# Patient Record
Sex: Female | Born: 1950 | Race: White | Hispanic: No | Smoking: Current every day smoker
Health system: Southern US, Community
[De-identification: ages and names within clinical notes are randomized; demographics above are authoritative.]

## PROBLEM LIST (undated history)

## (undated) DIAGNOSIS — E669 Obesity, unspecified: Secondary | ICD-10-CM

## (undated) DIAGNOSIS — Z0389 Encounter for observation for other suspected diseases and conditions ruled out: Secondary | ICD-10-CM

## (undated) DIAGNOSIS — G4733 Obstructive sleep apnea (adult) (pediatric): Secondary | ICD-10-CM

## (undated) DIAGNOSIS — L4050 Arthropathic psoriasis, unspecified: Secondary | ICD-10-CM

## (undated) DIAGNOSIS — I10 Essential (primary) hypertension: Secondary | ICD-10-CM

## (undated) DIAGNOSIS — B029 Zoster without complications: Secondary | ICD-10-CM

## (undated) DIAGNOSIS — E119 Type 2 diabetes mellitus without complications: Secondary | ICD-10-CM

## (undated) DIAGNOSIS — N189 Chronic kidney disease, unspecified: Secondary | ICD-10-CM

## (undated) DIAGNOSIS — E785 Hyperlipidemia, unspecified: Secondary | ICD-10-CM

## (undated) DIAGNOSIS — Z9989 Dependence on other enabling machines and devices: Secondary | ICD-10-CM

## (undated) HISTORY — DX: Obstructive sleep apnea (adult) (pediatric): G47.33

## (undated) HISTORY — DX: Encounter for observation for other suspected diseases and conditions ruled out: Z03.89

## (undated) HISTORY — DX: Hyperlipidemia, unspecified: E78.5

## (undated) HISTORY — DX: Obesity, unspecified: E66.9

## (undated) HISTORY — DX: Zoster without complications: B02.9

## (undated) HISTORY — DX: Dependence on other enabling machines and devices: Z99.89

---

## 1981-01-30 HISTORY — PX: ABDOMINAL HYSTERECTOMY: SHX81

## 1999-08-24 ENCOUNTER — Encounter: Admission: RE | Admit: 1999-08-24 | Discharge: 1999-11-22 | Payer: Self-pay | Admitting: Family Medicine

## 1999-10-24 ENCOUNTER — Encounter: Admission: RE | Admit: 1999-10-24 | Discharge: 1999-10-24 | Payer: Self-pay | Admitting: Family Medicine

## 1999-10-24 ENCOUNTER — Encounter: Payer: Self-pay | Admitting: Family Medicine

## 2000-04-04 ENCOUNTER — Encounter: Payer: Self-pay | Admitting: Gynecology

## 2000-04-04 ENCOUNTER — Encounter: Admission: RE | Admit: 2000-04-04 | Discharge: 2000-04-04 | Payer: Self-pay | Admitting: Gynecology

## 2000-05-24 ENCOUNTER — Encounter: Payer: Self-pay | Admitting: Family Medicine

## 2000-05-24 ENCOUNTER — Encounter: Admission: RE | Admit: 2000-05-24 | Discharge: 2000-05-24 | Payer: Self-pay | Admitting: Family Medicine

## 2001-05-02 ENCOUNTER — Other Ambulatory Visit: Admission: RE | Admit: 2001-05-02 | Discharge: 2001-05-02 | Payer: Self-pay | Admitting: Gynecology

## 2002-12-04 ENCOUNTER — Encounter: Admission: RE | Admit: 2002-12-04 | Discharge: 2003-03-04 | Payer: Self-pay | Admitting: Family Medicine

## 2003-04-06 ENCOUNTER — Other Ambulatory Visit: Admission: RE | Admit: 2003-04-06 | Discharge: 2003-04-06 | Payer: Self-pay | Admitting: Gynecology

## 2004-01-18 ENCOUNTER — Ambulatory Visit (HOSPITAL_COMMUNITY): Admission: RE | Admit: 2004-01-18 | Discharge: 2004-01-18 | Payer: Self-pay | Admitting: *Deleted

## 2004-01-18 ENCOUNTER — Encounter (INDEPENDENT_AMBULATORY_CARE_PROVIDER_SITE_OTHER): Payer: Self-pay | Admitting: *Deleted

## 2005-07-02 ENCOUNTER — Emergency Department (HOSPITAL_COMMUNITY): Admission: EM | Admit: 2005-07-02 | Discharge: 2005-07-02 | Payer: Self-pay | Admitting: Emergency Medicine

## 2007-06-11 ENCOUNTER — Encounter: Admission: RE | Admit: 2007-06-11 | Discharge: 2007-06-11 | Payer: Self-pay | Admitting: Gynecology

## 2009-01-30 DIAGNOSIS — IMO0001 Reserved for inherently not codable concepts without codable children: Secondary | ICD-10-CM

## 2009-01-30 HISTORY — DX: Reserved for inherently not codable concepts without codable children: IMO0001

## 2009-02-16 ENCOUNTER — Encounter: Admission: RE | Admit: 2009-02-16 | Discharge: 2009-02-16 | Payer: Self-pay | Admitting: Gynecology

## 2009-03-22 HISTORY — PX: US ECHOCARDIOGRAPHY: HXRAD669

## 2009-04-02 ENCOUNTER — Ambulatory Visit (HOSPITAL_COMMUNITY): Admission: RE | Admit: 2009-04-02 | Discharge: 2009-04-02 | Payer: Self-pay | Admitting: Cardiology

## 2009-04-02 HISTORY — PX: CARDIAC CATHETERIZATION: SHX172

## 2010-03-28 ENCOUNTER — Other Ambulatory Visit: Payer: Self-pay | Admitting: Gynecology

## 2010-03-28 DIAGNOSIS — Z1231 Encounter for screening mammogram for malignant neoplasm of breast: Secondary | ICD-10-CM

## 2010-04-05 ENCOUNTER — Ambulatory Visit: Payer: Self-pay

## 2010-04-11 ENCOUNTER — Ambulatory Visit
Admission: RE | Admit: 2010-04-11 | Discharge: 2010-04-11 | Disposition: A | Discharge status: Home / Self Care | Payer: Private Health Insurance - Indemnity | Source: Ambulatory Visit | Attending: Gynecology | Admitting: Gynecology

## 2010-04-11 DIAGNOSIS — Z1231 Encounter for screening mammogram for malignant neoplasm of breast: Secondary | ICD-10-CM

## 2011-05-26 ENCOUNTER — Other Ambulatory Visit: Payer: Self-pay | Admitting: Gynecology

## 2011-05-26 DIAGNOSIS — Z1231 Encounter for screening mammogram for malignant neoplasm of breast: Secondary | ICD-10-CM

## 2011-06-05 ENCOUNTER — Ambulatory Visit
Admission: RE | Admit: 2011-06-05 | Discharge: 2011-06-05 | Disposition: A | Discharge status: Home / Self Care | Payer: Managed Care, Other (non HMO) | Source: Ambulatory Visit | Attending: Gynecology | Admitting: Gynecology

## 2011-06-05 DIAGNOSIS — Z1231 Encounter for screening mammogram for malignant neoplasm of breast: Secondary | ICD-10-CM

## 2012-07-29 ENCOUNTER — Other Ambulatory Visit: Payer: Self-pay | Admitting: Cardiovascular Disease

## 2012-07-29 NOTE — Telephone Encounter (Signed)
Already filled Rx, receipt confirmed by pharmacy.

## 2012-07-29 NOTE — Telephone Encounter (Signed)
Rx was sent to pharmacy electronically. 

## 2012-07-30 DIAGNOSIS — B029 Zoster without complications: Secondary | ICD-10-CM

## 2012-07-30 HISTORY — DX: Zoster without complications: B02.9

## 2012-08-02 ENCOUNTER — Other Ambulatory Visit: Payer: Self-pay | Admitting: Cardiovascular Disease

## 2012-08-05 NOTE — Telephone Encounter (Signed)
I refilled this request on 07/29/2012. Called the pharmacy to make sure that Rx was received, pharmacy confirmed receipt of refill. This Rx request was denied.

## 2012-08-13 ENCOUNTER — Emergency Department (HOSPITAL_COMMUNITY)
Admission: EM | Admit: 2012-08-13 | Discharge: 2012-08-13 | Disposition: A | Discharge status: Home / Self Care | Payer: Managed Care, Other (non HMO) | Attending: Emergency Medicine | Admitting: Emergency Medicine

## 2012-08-13 ENCOUNTER — Emergency Department (HOSPITAL_COMMUNITY): Payer: Managed Care, Other (non HMO)

## 2012-08-13 ENCOUNTER — Encounter (HOSPITAL_COMMUNITY): Payer: Self-pay | Admitting: Physical Medicine and Rehabilitation

## 2012-08-13 DIAGNOSIS — R61 Generalized hyperhidrosis: Secondary | ICD-10-CM | POA: Insufficient documentation

## 2012-08-13 DIAGNOSIS — R079 Chest pain, unspecified: Secondary | ICD-10-CM

## 2012-08-13 DIAGNOSIS — R112 Nausea with vomiting, unspecified: Secondary | ICD-10-CM | POA: Insufficient documentation

## 2012-08-13 DIAGNOSIS — Z794 Long term (current) use of insulin: Secondary | ICD-10-CM | POA: Insufficient documentation

## 2012-08-13 DIAGNOSIS — E119 Type 2 diabetes mellitus without complications: Secondary | ICD-10-CM | POA: Insufficient documentation

## 2012-08-13 DIAGNOSIS — F17200 Nicotine dependence, unspecified, uncomplicated: Secondary | ICD-10-CM | POA: Insufficient documentation

## 2012-08-13 DIAGNOSIS — I10 Essential (primary) hypertension: Secondary | ICD-10-CM | POA: Insufficient documentation

## 2012-08-13 DIAGNOSIS — R0602 Shortness of breath: Secondary | ICD-10-CM | POA: Insufficient documentation

## 2012-08-13 DIAGNOSIS — R0789 Other chest pain: Secondary | ICD-10-CM | POA: Insufficient documentation

## 2012-08-13 DIAGNOSIS — Z79899 Other long term (current) drug therapy: Secondary | ICD-10-CM | POA: Insufficient documentation

## 2012-08-13 HISTORY — DX: Essential (primary) hypertension: I10

## 2012-08-13 HISTORY — DX: Type 2 diabetes mellitus without complications: E11.9

## 2012-08-13 LAB — CBC WITH DIFFERENTIAL/PLATELET
Basophils Relative: 0 % (ref 0–1)
Eosinophils Absolute: 0.1 10*3/uL (ref 0.0–0.7)
HCT: 36.9 % (ref 36.0–46.0)
Hemoglobin: 13 g/dL (ref 12.0–15.0)
MCH: 31.7 pg (ref 26.0–34.0)
MCHC: 35.2 g/dL (ref 30.0–36.0)
MCV: 90 fL (ref 78.0–100.0)
Monocytes Absolute: 1 10*3/uL (ref 0.1–1.0)
Monocytes Relative: 8 % (ref 3–12)

## 2012-08-13 LAB — BASIC METABOLIC PANEL
BUN: 21 mg/dL (ref 6–23)
Chloride: 95 mEq/L — ABNORMAL LOW (ref 96–112)
Creatinine, Ser: 1.02 mg/dL (ref 0.50–1.10)
GFR calc Af Amer: 68 mL/min — ABNORMAL LOW (ref >90)
GFR calc non Af Amer: 59 mL/min — ABNORMAL LOW (ref >90)

## 2012-08-13 LAB — POCT I-STAT TROPONIN I

## 2012-08-13 MED ORDER — IOHEXOL 350 MG/ML SOLN
100.0000 mL | Freq: Once | INTRAVENOUS | Status: AC | PRN
  Administered 2012-08-13: 100 mL via INTRAVENOUS

## 2012-08-13 MED ORDER — SODIUM CHLORIDE 0.9 % IV BOLUS (SEPSIS): 1000.0000 mL | Freq: Once | INTRAVENOUS | Status: DC

## 2012-08-13 MED ORDER — NITROGLYCERIN 0.4 MG SL SUBL
0.4000 mg | SUBLINGUAL_TABLET | SUBLINGUAL | Status: DC | PRN
  Administered 2012-08-13 (×2): 0.4 mg via SUBLINGUAL

## 2012-08-13 MED ORDER — SODIUM CHLORIDE 0.9 % IV BOLUS (SEPSIS)
1000.0000 mL | Freq: Once | INTRAVENOUS | Status: AC
  Administered 2012-08-13: 1000 mL via INTRAVENOUS

## 2012-08-13 MED ORDER — ASPIRIN 325 MG PO TABS
325.0000 mg | ORAL_TABLET | Freq: Once | ORAL | Status: AC
  Administered 2012-08-13: 325 mg via ORAL
  Filled 2012-08-13: qty 1

## 2012-08-13 MED ORDER — ATROPINE SULFATE 1 MG/ML IJ SOLN: 0.5000 mg | Freq: Once | INTRAMUSCULAR | Status: DC

## 2012-08-13 NOTE — ED Notes (Signed)
Pt continues to c/o right chest pain.  NTG 1 SL given for same Pt rates pain #6 on pain scale 0/10

## 2012-08-13 NOTE — ED Notes (Signed)
Pt presents to department for evaluation of R sided chest pain radiating to back. Onset Monday night. 8/10 pain at the time, increases with deep breathing. Pt is alert and oriented x4. Respirations unlabored.

## 2012-08-13 NOTE — ED Notes (Signed)
Pt st's pain is now #4 on pain scale 0/10.  2'nd NTG SL given

## 2012-08-13 NOTE — ED Provider Notes (Signed)
History    CSN: [id number] Arrival date & time 08/13/12  1606  First MD Initiated Contact with Patient 08/13/12 1734     Chief Complaint  Patient presents with  . Chest Pain  . Shortness of Breath   (Consider location/radiation/quality/duration/timing/severity/associated sxs/prior Treatment) Patient is a 62 y.o. female presenting with chest pain and shortness of breath. The history is provided by the patient.  Chest Pain Associated symptoms: shortness of breath   Associated symptoms comment:  KADIA MILKEY is a 62 y.o. female h/o DM, HTM, HLD, here with R chest pain beginning yesterday.  Described as tight and sharp, it began while sitting at work. Associated with SOB, diaphoresis, and emesis x3 after she got home.  She also states her she hand R hand numbness and tingling during that time.  Patient denies feeling this way previously or any history of DVT/PE/ACS.  Pain is pleuritic and it radiates straight through to her back.  She is still currently having the chest pain. She denies fever or worsening cough. Patient is a smoker.  ROS is otherwise negative.   Shortness of Breath Associated symptoms: chest pain    Past Medical History  Diagnosis Date  . Hypertension   . Diabetes mellitus without complication    No past surgical history on file. No family history on file. History  Substance Use Topics  . Smoking status: Current Every Day Smoker    Types: Cigarettes  . Smokeless tobacco: Not on file  . Alcohol Use: No   OB History   Grav Para Term Preterm Abortions TAB SAB Ect Mult Living                 Review of Systems  Respiratory: Positive for shortness of breath.   Cardiovascular: Positive for chest pain.  10 Systems reviewed and are negative for acute change except as noted in the HPI.   Allergies  Review of patient's allergies indicates no known allergies.  Home Medications   Current Outpatient Rx  Name  Route  Sig  Dispense  Refill  . amLODipine  (NORVASC) 10 MG tablet   Oral   Take 10 mg by mouth daily.         . cyclobenzaprine (FLEXERIL) 5 MG tablet   Oral   Take 5 mg by mouth 3 (three) times daily as needed for muscle spasms.         Marland Kitchen ezetimibe-simvastatin (VYTORIN) 10-20 MG per tablet   Oral   Take 1 tablet by mouth at bedtime.         . fenofibrate (TRICOR) 145 MG tablet   Oral   Take 145 mg by mouth daily.         Marland Kitchen FLUoxetine (PROZAC) 20 MG capsule   Oral   Take 20 mg by mouth daily.         . folic acid (FOLVITE) 1 MG tablet   Oral   Take 1 mg by mouth daily.         . Insulin Detemir (LEVEMIR FLEXPEN [place])   Subcutaneous   Inject 52 Units into the skin 2 (two) times daily.         . Insulin Detemir (LEVEMIR FLEXPEN [place])   Subcutaneous   Inject 52 Units into the skin daily.         . Liraglutide (VICTOZA) 18 MG/3ML SOPN   Subcutaneous   Inject 1.2 mLs into the skin every 30 (thirty) days.         Marland Kitchen  lisinopril-hydrochlorothiazide (PRINZIDE,ZESTORETIC) 20-12.5 MG per tablet   Oral   Take 1 tablet by mouth daily.         . metFORMIN (GLUCOPHAGE) 1000 MG tablet   Oral   Take 1,000 mg by mouth 2 (two) times daily with a meal.         . nebivolol (BYSTOLIC) 5 MG tablet   Oral   Take 5 mg by mouth daily.          BP 173/49  Pulse 57  Temp(Src) 97.8 F (36.6 C) (Oral)  Resp 24  SpO2 95% Physical Exam  Nursing note and vitals reviewed. Constitutional: She is oriented to person, place, and time. She appears well-developed and well-nourished. No distress.  HENT:  Head: Normocephalic and atraumatic.  Nose: Nose normal.  Mouth/Throat: Oropharynx is clear and moist. No oropharyngeal exudate.  Eyes: Conjunctivae and EOM are normal. Pupils are equal, round, and reactive to light. No scleral icterus.  Neck: Normal range of motion. Neck supple. No JVD present. No tracheal deviation present. No thyromegaly present.  Cardiovascular: Normal rate, regular rhythm and normal heart sounds.   Exam reveals no gallop and no friction rub.   No murmur heard. Pulmonary/Chest: Effort normal. No respiratory distress. She has wheezes. She exhibits no tenderness.  Wheezes are intermittent  Abdominal: Soft. Bowel sounds are normal. She exhibits no distension and no mass. There is no tenderness. There is no rebound and no guarding.  Musculoskeletal: Normal range of motion. She exhibits no edema and no tenderness.  Lymphadenopathy:    She has no cervical adenopathy.  Neurological: She is alert and oriented to person, place, and time.  Skin: Skin is warm. No rash noted. She is diaphoretic. No erythema. No pallor.    ED Course  Procedures (including critical care time) Labs Reviewed  CBC WITH DIFFERENTIAL - Abnormal; Notable for the following:    WBC 11.9 (*)    Neutro Abs 8.0 (*)    All other components within normal limits  BASIC METABOLIC PANEL - Abnormal; Notable for the following:    Sodium 134 (*)    Chloride 95 (*)    Glucose, Bld 177 (*)    GFR calc non Af Amer 59 (*)    GFR calc Af Amer 68 (*)    All other components within normal limits  POCT I-STAT TROPONIN I   Dg Chest 2 View  08/13/2012   *RADIOLOGY REPORT*  Clinical Data: Chest pain. Shortness of breath.  Tobacco use.  CHEST - 2 VIEW  Comparison: 03/09/2009  Findings: Stable cardiomegaly.  The lungs appear clear.  Large lung volumes suggest emphysema.  Thoracic spondylosis noted.  IMPRESSION:  1.  Stable cardiomegaly. 2.  Suspected emphysema.   Original Report Authenticated By: Van Clines, M.D.   Ct Angio Chest Pe W/cm &/or Wo Cm  08/13/2012   *RADIOLOGY REPORT*  Clinical Data: Chest pain radiating to back for 2 days.  Shortness of breath.  CT ANGIOGRAPHY CHEST  Technique:  Multidetector CT imaging of the chest using the standard protocol during bolus administration of intravenous contrast. Multiplanar reconstructed images including MIPs were obtained and reviewed to evaluate the vascular anatomy.  Contrast: 164mL  OMNIPAQUE IOHEXOL 350 MG/ML SOLN  Comparison: None.  Findings:  THORACIC INLET/BODY WALL:  No acute abnormality.  MEDIASTINUM:  Mild cardiomegaly.  Coronary artery atherosclerotic calcification, affecting both the left and right circulation.  No pericardial effusion.  No acute vascular abnormality.  No adenopathy.  LUNG WINDOWS:  No consolidation.  No effusion.  Diffuse bronchial wall thickening. The lower lungs shows mosaic attenuation. Flat nodule along the lower right major fissure, 5 x 2 mm, most likely subpleural lymph node due to morphology.  UPPER ABDOMEN:  The surface of the left lobe liver appears nodular.  Imaged portions of the caudate lobe appear enlarged.  OSSEOUS:  No acute fracture.  No suspicious lytic or blastic lesions.Incidental fatty infiltration and lower right erector spinous musculature.  IMPRESSION:  1.  Negative for pulmonary embolism. 2.  Bronchitis.  Heterogeneous aeration in the lower lungs, usually related air trapping. 3.  Question liver cirrhosis.  Recommend outpatient workup.   Original Report Authenticated By: Jorje Guild   No diagnosis found.  Date: 08/13/2012  Rate: 55  Rhythm: normal sinus rhythm  QRS Axis: normal  Intervals: normal  ST/T Wave abnormalities: nonspecific ST changes  Conduction Disutrbances:none  Narrative Interpretation:   Old EKG Reviewed: none available   MDM  Patient given full dose aspirin and NTG for possible ACS.   CT did not reveal any PE.  Patient will be DC home if her 2nd troponin is negative.  I still have concern for ACS in this patient, thus she was given strict instructions to follow up with her cardiologist within 1-2 days for stress testing.  She states she can do this and it will be possible.  Patient is amendable with this plan.     Everlene Balls, MD 08/13/12 2053

## 2012-08-16 NOTE — ED Provider Notes (Signed)
I saw and evaluated the patient, reviewed the resident's note and I agree with the findings and plan.  Patient evaluated for atypical chest pain. Pain is right-sided, sharp in nature. Felt to be unlikely to be acute coronary syndrome. Cardiac workup negative. PE also considered in the differential diagnosis. CT angiography, however, negative. Patient to be discharged to follow up with primary doctor. Agree with resident interpretation of EKG.  Orpah Greek, MD 08/16/12 [phone]

## 2012-08-17 ENCOUNTER — Encounter: Payer: Self-pay | Admitting: *Deleted

## 2012-08-19 ENCOUNTER — Ambulatory Visit: Payer: Managed Care, Other (non HMO) | Admitting: Cardiology

## 2012-08-29 ENCOUNTER — Encounter: Payer: Self-pay | Admitting: Cardiology

## 2012-08-29 ENCOUNTER — Encounter: Payer: Self-pay | Admitting: Cardiovascular Disease

## 2012-08-29 ENCOUNTER — Ambulatory Visit (INDEPENDENT_AMBULATORY_CARE_PROVIDER_SITE_OTHER): Payer: Managed Care, Other (non HMO) | Admitting: Cardiology

## 2012-08-29 VITALS — BP 120/62 | HR 62 | Ht 68.0 in | Wt 233.5 lb

## 2012-08-29 DIAGNOSIS — B029 Zoster without complications: Secondary | ICD-10-CM | POA: Insufficient documentation

## 2012-08-29 DIAGNOSIS — G4730 Sleep apnea, unspecified: Secondary | ICD-10-CM | POA: Insufficient documentation

## 2012-08-29 DIAGNOSIS — I10 Essential (primary) hypertension: Secondary | ICD-10-CM | POA: Insufficient documentation

## 2012-08-29 DIAGNOSIS — E669 Obesity, unspecified: Secondary | ICD-10-CM | POA: Insufficient documentation

## 2012-08-29 DIAGNOSIS — IMO0001 Reserved for inherently not codable concepts without codable children: Secondary | ICD-10-CM | POA: Insufficient documentation

## 2012-08-29 DIAGNOSIS — Z8249 Family history of ischemic heart disease and other diseases of the circulatory system: Secondary | ICD-10-CM

## 2012-08-29 DIAGNOSIS — F17200 Nicotine dependence, unspecified, uncomplicated: Secondary | ICD-10-CM

## 2012-08-29 DIAGNOSIS — E785 Hyperlipidemia, unspecified: Secondary | ICD-10-CM

## 2012-08-29 DIAGNOSIS — R079 Chest pain, unspecified: Secondary | ICD-10-CM

## 2012-08-29 DIAGNOSIS — Z0389 Encounter for observation for other suspected diseases and conditions ruled out: Secondary | ICD-10-CM

## 2012-08-29 DIAGNOSIS — R0609 Other forms of dyspnea: Secondary | ICD-10-CM

## 2012-08-29 DIAGNOSIS — R0600 Dyspnea, unspecified: Secondary | ICD-10-CM

## 2012-08-29 DIAGNOSIS — E119 Type 2 diabetes mellitus without complications: Secondary | ICD-10-CM | POA: Insufficient documentation

## 2012-08-29 NOTE — Assessment & Plan Note (Signed)
Changed from Crestor to Vytorin 10/20 by her primary MD secondary to arm pain

## 2012-08-29 NOTE — Assessment & Plan Note (Signed)
Seen in the office today for further eval, seen in ER 7/15- CTA negative for PE then

## 2012-08-29 NOTE — Assessment & Plan Note (Signed)
She feels this is her most concerning symptom- DOE

## 2012-08-29 NOTE — Patient Instructions (Signed)
Your physician has requested that you have an echocardiogram. Echocardiography is a painless test that uses sound waves to create images of your heart. It provides your doctor with information about the size and shape of your heart and how well your heart's chambers and valves are working. This procedure takes approximately one hour. There are no restrictions for this procedure. Your physician has requested that you have a lexiscan myoview. For further information please visit [email or web address]. Please follow instruction sheet, as given. Follow up with Dr Claiborne Billings after tests

## 2012-08-29 NOTE — Assessment & Plan Note (Signed)
Father had CAD in his 74s

## 2012-08-29 NOTE — Assessment & Plan Note (Signed)
She says she has cut down to 1/2 pk a day

## 2012-08-29 NOTE — Progress Notes (Signed)
08/29/2012 Kristy Dougherty   1951-01-15  [id number]  Primary Physicia SHAW,KIMBERLEE, MD Primary Cardiologist: Dr Claiborne Billings  HPI:  Pleasant 62 y/o female, followed by Dr Claiborne Billings, with a history of DM, HTN, dyslipidemia, obesity, OSA on C-pap, and smoking. She had a cath in 2011 that showed no significant CAD. Echo in 2011 showed NL LVF. Her LOV was July 2013. She is here today for a follow up after an ER visit 08/13/12. She presented to the ER at [facility] with complaints of "sharp" mid sternal pain that radiated to her back. CTA was negative for PE and there was no mention of Aortic dissection. It was suggested she follow up with Dr Claiborne Billings. The next day she says she "broke out in shingles" on her Rt chest and back. She was Rx'd with a anti viral for 7 days. She continues to complain of pleuritic chest pain as well as dyspnea on exertion. It is actually the dyspnea that has her most concerned. She denies orthopnea or PND.    Current Outpatient Prescriptions  Medication Sig Dispense Refill  . amLODipine (NORVASC) 10 MG tablet Take 10 mg by mouth daily.      Marland Kitchen aspirin EC 81 MG tablet Take 81 mg by mouth daily.      . Choline Fenofibrate (TRILIPIX) 135 MG capsule Take 135 mg by mouth daily.      . cyclobenzaprine (FLEXERIL) 5 MG tablet Take 5 mg by mouth 3 (three) times daily as needed for muscle spasms.      Marland Kitchen esomeprazole (NEXIUM) 40 MG capsule Take 40 mg by mouth daily before breakfast.      . ezetimibe-simvastatin (VYTORIN) 10-20 MG per tablet Take 1 tablet by mouth at bedtime.      Marland Kitchen FLUoxetine (PROZAC) 20 MG capsule Take 20 mg by mouth daily.      . folic acid (FOLVITE) 1 MG tablet Take 1 mg by mouth daily.      . insulin aspart (NOVOLOG FLEXPEN) 100 UNIT/ML SOPN FlexPen Inject 15-20 Units into the skin 3 (three) times daily with meals. Sliding scale      . Insulin Detemir (LEVEMIR FLEXPEN [place]) Inject 52 Units into the skin 2 (two) times daily.      . Liraglutide (VICTOZA) 18 MG/3ML SOPN Inject 1.2 mLs  into the skin every 30 (thirty) days.      Marland Kitchen lisinopril-hydrochlorothiazide (PRINZIDE,ZESTORETIC) 20-12.5 MG per tablet Take 1 tablet by mouth 2 (two) times daily.       . metFORMIN (GLUCOPHAGE) 1000 MG tablet Take 1,000 mg by mouth 2 (two) times daily with a meal.      . methotrexate (RHEUMATREX) 2.5 MG tablet Take 12 mg by mouth once a week. Tuesdays.  Caution:Chemotherapy. Protect from light.      . nebivolol (BYSTOLIC) 5 MG tablet Take 5 mg by mouth daily.      . Omega-3 Fatty Acids (FISH OIL) 1200 MG CAPS Take 1 capsule by mouth 2 (two) times daily.       No current facility-administered medications for this visit.    No Known Allergies  History   Social History  . Marital Status: Widowed    Spouse Name: N/A    Number of Children: N/A  . Years of Education: N/A   Occupational History  . Not on file.   Social History Main Topics  . Smoking status: Current Every Day Smoker -- 0.75 packs/day for 40 years    Types: Cigarettes  . Smokeless tobacco: Never Used  .  Alcohol Use: No  . Drug Use: No  . Sexually Active: Not on file   Other Topics Concern  . Not on file   Social History Narrative  . No narrative on file     Review of Systems: General: negative for chills, fever, night sweats or weight changes.  Cardiovascular: negative for edema, orthopnea, palpitations, paroxysmal nocturnal dyspnea or shortness of breath Dermatological: negative for rash, positive for blisters from shingles Respiratory: negative for cough or wheezing Urologic: negative for hematuria Abdominal: negative for vomiting, diarrhea, bright red blood per rectum, melena, or hematemesis. She had had nausea and anorexia for two weeks Neurologic: negative for visual changes, syncope, or dizziness All other systems reviewed and are otherwise negative except as noted above. She says she is compliant with C-pap    Blood pressure 120/62, pulse 62, height 5\' 8"  (1.727 m), weight 233 lb 8 oz (105.915 kg).   General appearance: alert, cooperative, no distress and moderately obese Neck: no carotid bruit and no JVD Lungs: clear to auscultation bilaterally Heart: regular rate and rhythm Abdomen: obese, non tender Extremities: no edema Pulses: 2+ and symmetric DP pulses Skin: dried vesicles on her back and Rt chest Neurologic: Grossly normal  EKG   normal sinus rhythm, NSST chnages unchanged from previous tracings.  ASSESSMENT AND PLAN:   Chest pain with moderate risk of acute coronary syndrome Seen in the office today for further eval, seen in ER 7/15- CTA negative for PE then  Dyspnea She feels this is her most concerning symptom- DOE  Shingles outbreak Completed anti-viral Rx las week.   Diabetes mellitus .  Dyslipidemia Changed from Crestor to Vytorin 10/20 by her primary MD secondary to arm pain  HTN (hypertension) .  Sleep apnea- on C-pap .  Family history of coronary artery disease Father had CAD in his 67s  Obesity .  Smoker She says she has cut down to 1/2 pk a day  Normal coronary arteries- 2011 .   PLAN  Check an echo and Myoview, follow up with Dr Claiborne Billings after the above tests- ? Viral cardiomyopathy, ? Viral pericarditis, ? Progression of CAD. No medication changes until test results known.   [facility] KPA-C 08/29/2012 3:16 PM

## 2012-08-29 NOTE — Assessment & Plan Note (Signed)
Completed anti-viral Rx las week.

## 2012-09-03 ENCOUNTER — Ambulatory Visit: Payer: Managed Care, Other (non HMO) | Admitting: Cardiovascular Disease

## 2012-09-04 ENCOUNTER — Telehealth (HOSPITAL_COMMUNITY): Payer: Self-pay | Admitting: Cardiovascular Disease

## 2012-09-06 ENCOUNTER — Encounter (HOSPITAL_COMMUNITY): Payer: Managed Care, Other (non HMO)

## 2012-09-09 ENCOUNTER — Ambulatory Visit (HOSPITAL_COMMUNITY): Payer: Managed Care, Other (non HMO)

## 2012-09-10 ENCOUNTER — Ambulatory Visit: Payer: Managed Care, Other (non HMO) | Admitting: Cardiovascular Disease

## 2012-10-02 ENCOUNTER — Other Ambulatory Visit: Payer: Self-pay | Admitting: Family Medicine

## 2012-10-02 DIAGNOSIS — R160 Hepatomegaly, not elsewhere classified: Secondary | ICD-10-CM

## 2012-10-09 ENCOUNTER — Ambulatory Visit
Admission: RE | Admit: 2012-10-09 | Discharge: 2012-10-09 | Disposition: A | Discharge status: Home / Self Care | Payer: Managed Care, Other (non HMO) | Source: Ambulatory Visit | Attending: Family Medicine | Admitting: Family Medicine

## 2012-10-09 DIAGNOSIS — R160 Hepatomegaly, not elsewhere classified: Secondary | ICD-10-CM

## 2012-10-15 ENCOUNTER — Encounter (HOSPITAL_COMMUNITY): Payer: Managed Care, Other (non HMO)

## 2012-10-21 ENCOUNTER — Ambulatory Visit (HOSPITAL_COMMUNITY)
Admission: RE | Admit: 2012-10-21 | Discharge: 2012-10-21 | Disposition: A | Discharge status: Home / Self Care | Payer: Managed Care, Other (non HMO) | Source: Ambulatory Visit | Attending: Cardiovascular Disease | Admitting: Cardiovascular Disease

## 2012-10-21 DIAGNOSIS — R0989 Other specified symptoms and signs involving the circulatory and respiratory systems: Secondary | ICD-10-CM | POA: Insufficient documentation

## 2012-10-21 DIAGNOSIS — R0602 Shortness of breath: Secondary | ICD-10-CM

## 2012-10-21 DIAGNOSIS — R0609 Other forms of dyspnea: Secondary | ICD-10-CM | POA: Insufficient documentation

## 2012-10-21 DIAGNOSIS — R0600 Dyspnea, unspecified: Secondary | ICD-10-CM

## 2012-10-21 NOTE — Progress Notes (Signed)
2D Echo Performed 10/21/2012    Marleena Shubert, RCS  

## 2012-10-24 ENCOUNTER — Telehealth: Payer: Self-pay | Admitting: *Deleted

## 2012-10-24 NOTE — Telephone Encounter (Signed)
Message copied by Raiford Simmonds on Thu Oct 24, 2012  9:19 AM ------      Message from: Erlene Quan      Created: Thu Oct 24, 2012  8:19 AM       Echo normal. Keep follow up appointment with Dr Claiborne Billings. ------

## 2012-10-24 NOTE — Telephone Encounter (Signed)
Spoke to patient. Result given . Verbalized understanding  Pt wanted to know if she should have a myoview  That was schedule for next week.  strongly encourage her to keep appointment. explained to the patient the tests look at separate things dealing with the heart. She stated she will keep appointent.

## 2012-10-29 ENCOUNTER — Ambulatory Visit (HOSPITAL_COMMUNITY)
Admission: RE | Admit: 2012-10-29 | Discharge: 2012-10-29 | Disposition: A | Discharge status: Home / Self Care | Payer: Managed Care, Other (non HMO) | Source: Ambulatory Visit | Attending: Cardiology | Admitting: Cardiology

## 2012-10-29 DIAGNOSIS — R079 Chest pain, unspecified: Secondary | ICD-10-CM

## 2012-10-29 MED ORDER — TECHNETIUM TC 99M SESTAMIBI GENERIC - CARDIOLITE
31.4000 | Freq: Once | INTRAVENOUS | Status: AC | PRN
  Administered 2012-10-29: 31 via INTRAVENOUS

## 2012-10-29 MED ORDER — REGADENOSON 0.4 MG/5ML IV SOLN
0.4000 mg | Freq: Once | INTRAVENOUS | Status: AC
  Administered 2012-10-29: 0.4 mg via INTRAVENOUS

## 2012-10-29 MED ORDER — TECHNETIUM TC 99M SESTAMIBI GENERIC - CARDIOLITE
10.9000 | Freq: Once | INTRAVENOUS | Status: AC | PRN
  Administered 2012-10-29: 10.9 via INTRAVENOUS

## 2012-10-29 NOTE — Procedures (Addendum)
[place] [address] [place] [employer] [address] [id number]  Cardiology Nuclear Med Study  Kristy Dougherty is a 62 y.o. female     MRN : [id number]     DOB: [birth year]  Procedure Date: 10/29/2012  Nuclear Med Background Indication for Stress Test:  Evaluation for Ischemia History:  No prior history reported. Cardiac Risk Factors: Family History - CAD, Hypertension, IDDM Type 2, Lipids, Obesity and Smoker  Symptoms:  Chest Pain, Dizziness, DOE, Light-Headedness and SOB   Nuclear Pre-Procedure Caffeine/Decaff Intake:  7:00pm NPO After: 5:00am   IV Site: R Forearm  IV 0.9% NS with Angio Cath:  22g  Chest Size (in):  n/a IV Started by: Azucena Cecil, RN  Height: 5\' 8"  (1.727 m)  Cup Size: B  BMI:  Body mass index is 35.44 kg/(m^2). Weight:  233 lb (105.688 kg)   Tech Comments:  n/a    Nuclear Med Study 1 or 2 day study: 1 day  Stress Test Type:  [place] Provider:  Shelva Majestic, MD   Resting Radionuclide: Technetium 33m Sestamibi  Resting Radionuclide Dose: 10.9 mCi   Stress Radionuclide:  Technetium 65m Sestamibi  Stress Radionuclide Dose: 31.4 mCi           Stress Protocol Rest HR: 51 Stress HR: 60  Rest BP: 115/53 Stress BP: 145/55  Exercise Time (min): n/a METS: n/a          Dose of Adenosine (mg):  n/a Dose of Lexiscan: 0.4 mg  Dose of Atropine (mg): n/a Dose of Dobutamine: n/a mcg/kg/min (at max HR)  Stress Test Technologist: Mellody Memos, CCT Nuclear Technologist: Imagene Riches, CNMT   Rest Procedure:  Myocardial perfusion imaging was performed at rest 45 minutes following the intravenous administration of Technetium 16m Sestamibi. Stress Procedure:  The patient received IV Lexiscan 0.4 mg over 15-seconds.  Technetium 63m Sestamibi injected at 30-seconds.  There were no significant changes with Lexiscan.  Quantitative spect images were obtained after a 45 minute delay.  Transient  Ischemic Dilatation (Normal <1.22):  1.04 Lung/Heart Ratio (Normal <0.45):  0.32 QGS EDV:  118 ml QGS ESV:  43 ml LV Ejection Fraction: 64%  Signed by      Rest ECG: NSR - Normal EKG  Stress ECG: No significant change from baseline ECG  QPS Raw Data Images:  Normal; no motion artifact; normal heart/lung ratio. Stress Images:  Normal homogeneous uptake in all areas of the myocardium. Rest Images:  Normal homogeneous uptake in all areas of the myocardium. Subtraction (SDS):  Normal  Impression Exercise Capacity:  Lexiscan with no exercise. BP Response:  Normal blood pressure response. Clinical Symptoms:  No significant symptoms noted. ECG Impression:  No significant ST segment change suggestive of ischemia. Comparison with Prior Nuclear Study: No images to compare  Overall Impression:  Normal stress nuclear study.  LV Wall Motion:  NL LV Function, EF 64%; NL Wall Motion   Georgi Tuel A, MD  10/29/2012 1:52 PM

## 2012-10-31 NOTE — Progress Notes (Signed)
Quick Note:  Released nuc results into my chart. ______

## 2012-11-01 ENCOUNTER — Ambulatory Visit (INDEPENDENT_AMBULATORY_CARE_PROVIDER_SITE_OTHER): Payer: Managed Care, Other (non HMO) | Admitting: Cardiovascular Disease

## 2012-11-01 ENCOUNTER — Encounter: Payer: Self-pay | Admitting: Cardiovascular Disease

## 2012-11-01 VITALS — BP 110/60 | Ht 69.0 in | Wt 240.4 lb

## 2012-11-01 DIAGNOSIS — E785 Hyperlipidemia, unspecified: Secondary | ICD-10-CM

## 2012-11-01 DIAGNOSIS — E119 Type 2 diabetes mellitus without complications: Secondary | ICD-10-CM

## 2012-11-01 DIAGNOSIS — I10 Essential (primary) hypertension: Secondary | ICD-10-CM

## 2012-11-01 DIAGNOSIS — Z79899 Other long term (current) drug therapy: Secondary | ICD-10-CM

## 2012-11-01 DIAGNOSIS — G4730 Sleep apnea, unspecified: Secondary | ICD-10-CM

## 2012-11-01 DIAGNOSIS — E669 Obesity, unspecified: Secondary | ICD-10-CM

## 2012-11-01 MED ORDER — OMEGA-3-ACID ETHYL ESTERS 1 G PO CAPS: 2.0000 g | ORAL_CAPSULE | Freq: Two times a day (BID) | ORAL | Status: DC

## 2012-11-01 NOTE — Progress Notes (Signed)
Patient ID: Kristy Dougherty, female   DOB: 06-24-1950, 62 y.o.   MRN: [id number]      HPI: Kristy Dougherty, is a 62 y.o. female who presents to the office for cardiology evaluation. I last saw her in the office approximately 15 months ago.  [name] which is now 62 years old. 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 Remotely, she has had triglycerides in excess of 500.  Apparently, over the past several months she was seen in the emergency room with sharp chest pain. A chest CT was negative for PE. She later broke out in a rash and was diagnosed with shingles. She saw Kerin Ransom in followup. She subsequently underwent a 2-D echo Doppler study on 10/21/2012 which showed normal systolic and diastolic function. Ejection fraction was 55-60%. GEN mild right atrial dilatation. Pulmonary pressures were normal with an estimated RV pressure at 22 mm. A subsequent nuclear perfusion study done this week showed entirely normal perfusion without evidence for scar or ischemia. Post stress ejection fraction was 64%.   She recently underwent laboratory by her primary physician which revealed a BUN of 25 creatinine 1.53. Her total cholesterol is 168 HDL cholesterol 33 triglycerides 484. She presents for cardiology reevaluation.  Past Medical History  Diagnosis Date  . Hypertension   . Diabetes mellitus without complication   . OSA on CPAP   . Obesity   . Hyperlipemia   . Normal coronary arteries 2011  . Shingles July 2014    Past Surgical History  Procedure Laterality Date  . Abdominal hysterectomy  1983  . US echocardiography  03/22/2009    EF =>55%,trace MR,mild TR & PI  . Cardiac catheterization  04/02/2009    Normal coronary arteries    No Known Allergies  Current Outpatient Prescriptions  Medication Sig Dispense Refill  . amLODipine  (NORVASC) 10 MG tablet Take 10 mg by mouth daily.      Marland Kitchen aspirin EC 81 MG tablet Take 81 mg by mouth daily.      . Choline Fenofibrate (TRILIPIX) 135 MG capsule Take 135 mg by mouth daily.      . cyclobenzaprine (FLEXERIL) 5 MG tablet Take 5 mg by mouth 3 (three) times daily as needed for muscle spasms.      Marland Kitchen esomeprazole (NEXIUM) 40 MG capsule Take 40 mg by mouth daily before breakfast.      . ezetimibe-simvastatin (VYTORIN) 10-20 MG per tablet Take 1 tablet by mouth at bedtime.      Marland Kitchen FLUoxetine (PROZAC) 20 MG capsule Take 20 mg by mouth daily.      . folic acid (FOLVITE) 1 MG tablet Take 1 mg by mouth daily.      Marland Kitchen gabapentin (NEURONTIN) 100 MG capsule Take 3 capsules by mouth 2 (two) times daily.      . insulin aspart (NOVOLOG FLEXPEN) 100 UNIT/ML SOPN FlexPen Inject 15-20 Units into the skin 3 (three) times daily with meals. Sliding scale      . Insulin Detemir (LEVEMIR FLEXPEN [place]) Inject 52 Units into the skin 2 (two) times daily.      . Liraglutide (VICTOZA) 18 MG/3ML SOPN Inject 1.2 mLs into the skin every 30 (thirty) days.      Marland Kitchen lisinopril-hydrochlorothiazide (PRINZIDE,ZESTORETIC) 20-12.5 MG per tablet Take 1 tablet by mouth 2 (two) times daily.       . metFORMIN (  GLUCOPHAGE) 1000 MG tablet Take 1,000 mg by mouth 2 (two) times daily with a meal.      . methotrexate (RHEUMATREX) 2.5 MG tablet Take 12 mg by mouth once a week. Tuesdays.  Caution:Chemotherapy. Protect from light.      . nebivolol (BYSTOLIC) 5 MG tablet Take 5 mg by mouth daily.      . Omega-3 Fatty Acids (FISH OIL) 1200 MG CAPS Take 1 capsule by mouth 2 (two) times daily.      . traMADol (ULTRAM) 50 MG tablet Take 1 tablet by mouth as needed.       No current facility-administered medications for this visit.    History   Social History  . Marital Status: Widowed    Spouse Name: N/A    Number of Children: N/A  . Years of Education: N/A   Occupational History  . Not on file.   Social History Main Topics  .  Smoking status: Current Every Day Smoker -- 0.75 packs/day for 40 years    Types: Cigarettes  . Smokeless tobacco: Never Used  . Alcohol Use: No  . Drug Use: No  . Sexual Activity: Not on file   Other Topics Concern  . Not on file   Social History Narrative  . No narrative on file    Family History  Problem Relation Age of Onset  . Heart attack Father   . Cancer Mother     lung    ROS is negative for fevers, chills or night sweats. She denies significant weight change. Since her rash is cleared from shingles she denies recurrent rash. Respiratory is negative for cough or wheezing. She denies chest pressure. She denies tachycardia palpitations. There is no presyncope. Her abdomen is negative for vomiting nausea or vomiting. She denies bleeding. She denies change in bowel consistency. She states she was told of having a slightly enlarged liver. There is no history of alcohol use. She denies claudication. She denies edema. She denies neurologic symptoms. She denies visual changes. She uses CPAP with 100% compliance and even uses this to take her naps when she does take a nap  Other system review is negative.  PE BP 110/60  Ht 5\' 9"  (1.753 m)  Wt 240 lb 6.4 oz (109.045 kg)  BMI 35.48 kg/m2  General: Alert, oriented, no distress.  Skin: normal turgor, no rashes HEENT: Normocephalic, atraumatic. Pupils round and reactive; sclera anicteric;no lid lag.  Nose without nasal septal hypertrophy Mouth/Parynx benign; Mallinpatti scale 3 Neck: No JVD, no carotid briuts Lungs: clear to ausculatation and percussion; no wheezing or rales Heart: RRR, s1 s2 normal 1/6 sem Abdomen: Moderate central adiposity; soft, nontender; no hepatosplenomehaly, BS+; abdominal aorta nontender and not dilated by palpation. Pulses 2+ Extremities: no clubbing cyanosis or edema, Homan's sign negative  Neurologic: grossly nonfocal Psychologic: Normal affect and mood   LABS:  BMET    Component Value Date/Time     NA 134* 08/13/2012 1616   K 4.6 08/13/2012 1616   CL 95* 08/13/2012 1616   CO2 25 08/13/2012 1616   GLUCOSE 177* 08/13/2012 1616   BUN 21 08/13/2012 1616   CREATININE 1.02 08/13/2012 1616   CALCIUM 10.1 08/13/2012 1616   GFRNONAA 59* 08/13/2012 1616   GFRAA 68* 08/13/2012 1616     Hepatic Function Panel  No results found for this basename: prot, albumin, ast, alt, alkphos, bilitot, bilidir, ibili     CBC    Component Value Date/Time   WBC 11.9* 08/13/2012 1616  RBC 4.10 08/13/2012 1616   HGB 13.0 08/13/2012 1616   HCT 36.9 08/13/2012 1616   PLT 253 08/13/2012 1616   MCV 90.0 08/13/2012 1616   MCH 31.7 08/13/2012 1616   MCHC 35.2 08/13/2012 1616   RDW 14.4 08/13/2012 1616   LYMPHSABS 2.8 08/13/2012 1616   MONOABS 1.0 08/13/2012 1616   EOSABS 0.1 08/13/2012 1616   BASOSABS 0.0 08/13/2012 1616     BNP No results found for this basename: probnp    Lipid Panel  No results found for this basename: chol, trig, hdl, cholhdl, vldl, ldlcalc     RADIOLOGY: US Abdomen Limited  10/09/2012   *RADIOLOGY REPORT*  Clinical Data:  History of hepatomegaly .  Question of cirrhosis. Prior hysterectomy.  History of insulin dependent diabetes, hypertension and high cholesterol.  LIMITED ABDOMINAL ULTRASOUND - RIGHT UPPER QUADRANT  Comparison:  CT of the chest 08/13/2012  Findings:  Gallbladder:  Gallbladder wall measures 2.6 mm.  There is slight irregularity of the gallbladder wall of the fundus, raising the question of a small polyp.  No definite stones identified.  No pericholecystic fluid or sonographic Murphy's sign.  Common bile duct:  Normal appearance, 5.4 mm.  Liver:  Enlarged, 22.2 cm in length.  Liver is echogenic without discrete lesion.  IMPRESSION:  1.  No focal liver lesion; hepatomegaly. 2.  Question of small fundal gallbladder polyp. 3.  No evidence for acute cholecystitis.                    Original Report Authenticated By: Nolon Nations, M.D.      ASSESSMENT AND PLAN: My impression  is that [name] is a pleasant 62 year old female who has a history of hypertension, type 2 diabetes mellitus, obesity, severe to sleep apnea, and marked mixed hyperlipidemia atherogenic dyslipidemia pattern. I did review her echo Doppler data which shows fairly normal heart function without significant structural abnormalities. Pulmonate pressures are normal. She has sleep apnea and uses CPAP 100% of the time. Her chest pain most likely was due to shingles. Her nuclear perfusion study is without ischemia or scar. Her blood pressure is well-controlled on her current multible medical regimen.  Idid review her laboratory done by her primary physician. I'm recommending she resumed Lovenox of 4 capsules daily and she will continue with her Vytorin 10/20 appear if triglycerides remain markedly elevated she may require the addition of fenofibrate with her medical regimen to. In 4 months she will undergo a NMR lipoprofile  as well as a comprehensive metabolic panel. I'll see her in 6 months for cardiology reassessment     Troy Sine, MD, [facility]  11/01/2012 8:59 AM

## 2012-11-01 NOTE — Patient Instructions (Addendum)
Your physician recommends that you schedule a follow-up appointment in: 6 months  Your physician recommends that you return for lab work in: 4 Months NMR lipids, CMP  Your physician has recommended you make the following change in your medication: Start LOVAZA, stop over the counter fish oil

## 2012-11-01 NOTE — Progress Notes (Signed)
Quick Note:  Released into my chart. Also discussed at 10/3 14 appointment. ______

## 2012-11-05 ENCOUNTER — Other Ambulatory Visit: Payer: Self-pay

## 2012-11-05 DIAGNOSIS — E785 Hyperlipidemia, unspecified: Secondary | ICD-10-CM

## 2012-11-05 MED ORDER — OMEGA-3-ACID ETHYL ESTERS 1 G PO CAPS: 2.0000 g | ORAL_CAPSULE | Freq: Two times a day (BID) | ORAL | Status: DC

## 2012-11-05 NOTE — Telephone Encounter (Signed)
Rx was sent to pharmacy electronically. 

## 2012-12-05 ENCOUNTER — Other Ambulatory Visit: Payer: Self-pay

## 2012-12-17 ENCOUNTER — Other Ambulatory Visit: Payer: Self-pay | Admitting: Family Medicine

## 2012-12-17 DIAGNOSIS — R109 Unspecified abdominal pain: Secondary | ICD-10-CM

## 2012-12-17 DIAGNOSIS — Z1231 Encounter for screening mammogram for malignant neoplasm of breast: Secondary | ICD-10-CM

## 2012-12-22 ENCOUNTER — Other Ambulatory Visit: Payer: Self-pay | Admitting: Cardiovascular Disease

## 2012-12-23 ENCOUNTER — Encounter: Payer: Self-pay | Admitting: Cardiovascular Disease

## 2012-12-23 NOTE — Telephone Encounter (Signed)
Rx was sent to pharmacy electronically. 

## 2012-12-24 ENCOUNTER — Other Ambulatory Visit: Payer: Self-pay | Admitting: Internal Medicine

## 2012-12-24 DIAGNOSIS — M542 Cervicalgia: Secondary | ICD-10-CM

## 2012-12-25 ENCOUNTER — Ambulatory Visit
Admission: RE | Admit: 2012-12-25 | Discharge: 2012-12-25 | Disposition: A | Discharge status: Home / Self Care | Payer: Managed Care, Other (non HMO) | Source: Ambulatory Visit | Attending: Family Medicine | Admitting: Family Medicine

## 2012-12-25 DIAGNOSIS — R109 Unspecified abdominal pain: Secondary | ICD-10-CM

## 2012-12-25 MED ORDER — IOHEXOL 300 MG/ML  SOLN: 125.0000 mL | Freq: Once | INTRAMUSCULAR | Status: AC | PRN

## 2012-12-29 ENCOUNTER — Ambulatory Visit
Admission: RE | Admit: 2012-12-29 | Discharge: 2012-12-29 | Disposition: A | Discharge status: Home / Self Care | Payer: Managed Care, Other (non HMO) | Source: Ambulatory Visit | Attending: Internal Medicine | Admitting: Internal Medicine

## 2012-12-29 DIAGNOSIS — M542 Cervicalgia: Secondary | ICD-10-CM

## 2012-12-30 ENCOUNTER — Ambulatory Visit
Admission: RE | Admit: 2012-12-30 | Discharge: 2012-12-30 | Disposition: A | Discharge status: Home / Self Care | Payer: Managed Care, Other (non HMO) | Source: Ambulatory Visit | Attending: Family Medicine | Admitting: Family Medicine

## 2012-12-30 DIAGNOSIS — Z1231 Encounter for screening mammogram for malignant neoplasm of breast: Secondary | ICD-10-CM

## 2013-01-22 ENCOUNTER — Telehealth: Payer: Self-pay | Admitting: Internal Medicine

## 2013-01-22 NOTE — Telephone Encounter (Signed)
LVOM FOR PT TO RETURN CALL IN RE TO REFERRAL.  °

## 2013-01-27 ENCOUNTER — Telehealth: Payer: Self-pay | Admitting: Internal Medicine

## 2013-01-27 NOTE — Telephone Encounter (Signed)
LVOM FOR PT TO RETURN CALL IN RE TO REFERRAL.  °

## 2013-05-22 ENCOUNTER — Other Ambulatory Visit: Payer: Self-pay

## 2013-05-22 DIAGNOSIS — E785 Hyperlipidemia, unspecified: Secondary | ICD-10-CM

## 2013-05-22 MED ORDER — OMEGA-3-ACID ETHYL ESTERS 1 G PO CAPS: 2.0000 g | ORAL_CAPSULE | Freq: Two times a day (BID) | ORAL | Status: DC

## 2013-05-22 NOTE — Telephone Encounter (Signed)
Rx was sent to pharmacy electronically. 

## 2013-05-23 ENCOUNTER — Other Ambulatory Visit: Payer: Self-pay | Admitting: *Deleted

## 2013-05-23 NOTE — Telephone Encounter (Signed)
Medication was refilled yesterday.

## 2013-05-26 ENCOUNTER — Telehealth: Payer: Self-pay | Admitting: Cardiovascular Disease

## 2013-05-26 NOTE — Telephone Encounter (Signed)
Need refill on her Lovaza 1gram #[id number]

## 2013-05-26 NOTE — Telephone Encounter (Signed)
Returned call.  Left message that refill sent electronically on 4.23.15 and confirmation received.  Please check records and call back before 4pm if questions.

## 2013-08-15 ENCOUNTER — Other Ambulatory Visit: Payer: Self-pay | Admitting: Family Medicine

## 2013-08-15 ENCOUNTER — Ambulatory Visit
Admission: RE | Admit: 2013-08-15 | Discharge: 2013-08-15 | Disposition: A | Discharge status: Home / Self Care | Payer: Managed Care, Other (non HMO) | Source: Ambulatory Visit | Attending: Family Medicine | Admitting: Family Medicine

## 2013-08-15 DIAGNOSIS — R52 Pain, unspecified: Secondary | ICD-10-CM

## 2013-08-20 ENCOUNTER — Ambulatory Visit: Payer: Managed Care, Other (non HMO) | Admitting: Nutrition

## 2013-08-27 ENCOUNTER — Encounter: Payer: Managed Care, Other (non HMO) | Attending: Family Medicine | Admitting: Nutrition

## 2013-08-27 DIAGNOSIS — Z713 Dietary counseling and surveillance: Secondary | ICD-10-CM | POA: Insufficient documentation

## 2013-08-27 DIAGNOSIS — E119 Type 2 diabetes mellitus without complications: Secondary | ICD-10-CM | POA: Insufficient documentation

## 2013-08-27 DIAGNOSIS — Z794 Long term (current) use of insulin: Secondary | ICD-10-CM | POA: Diagnosis not present

## 2013-08-27 DIAGNOSIS — E0865 Diabetes mellitus due to underlying condition with hyperglycemia: Secondary | ICD-10-CM

## 2013-08-27 NOTE — Progress Notes (Signed)
This patient is here to start a V-go.  She was shown the device and we discussed how this works, how to fill it, how to apply it, and how to give meal time insulin. She took her Levemir insulin this morning, so she was not started on it today.  She re demonstrated how to fill, apply and use it correctly.  She was given a vial of Novolog to use with this. She was also given a V-Go 30 starter kit with 6 V-Go 30s and directions for filling, applying and using it, as well as a telephone number if she has questions, and to see if her insurance will cover this.  She was also given a Co-pay card to use as well.     She was told to test her blood sugars 4 times/day-before meals at at bedtime, and i will call her tomorrow to see how she did filling her first V-Go, and again in 4 days to review blood sugar readings.  If FBSs are high, I will switch her to a V-Go 40 on Monday.  Her weight is 239, so she was started on a V-Go 30 with boluses as per her pens, 10 u ac meals--5 button presses.  She reported good understanding of this, and had no final quesitons.   She was reminded to not take any more Levemir insulin, and she reported good understanding of this.  We reviewed low blood sugars--symtoms and treatments.  She reported good understanding of this.

## 2013-08-27 NOTE — Patient Instructions (Addendum)
Stop Levemir insulin.   Fill and apply a new V-Go every morning. Test blood sugars before meals and at bedtime.

## 2013-09-09 ENCOUNTER — Telehealth: Payer: Self-pay | Admitting: Family Medicine

## 2013-09-09 NOTE — Telephone Encounter (Signed)
3 Messages were left on her work, and [employer] phone to call me.  I did reach her on 08/30/13, and she said that she had not started on it as yet.  She had no questions about how to do this, and said that she did not have time due to very hectic schedule to start this. I offered her a return visit at no charge, but she refused.   I told her I would call her in 2-3 days to review blood sugars.  I have since left 4 voice mails with my telephone number to call me, but have not heard from her.

## 2013-11-28 ENCOUNTER — Other Ambulatory Visit: Payer: Self-pay

## 2013-11-28 MED ORDER — AMLODIPINE BESYLATE 10 MG PO TABS: 10.0000 mg | ORAL_TABLET | Freq: Every day | ORAL | Status: DC

## 2013-11-28 NOTE — Telephone Encounter (Signed)
Rx sent to pharmacy   

## 2013-12-22 ENCOUNTER — Ambulatory Visit
Admission: RE | Admit: 2013-12-22 | Discharge: 2013-12-22 | Disposition: A | Discharge status: Home / Self Care | Payer: Managed Care, Other (non HMO) | Source: Ambulatory Visit | Attending: Family Medicine | Admitting: Family Medicine

## 2013-12-22 ENCOUNTER — Other Ambulatory Visit: Payer: Self-pay | Admitting: Family Medicine

## 2013-12-22 DIAGNOSIS — R059 Cough, unspecified: Secondary | ICD-10-CM

## 2013-12-22 DIAGNOSIS — R05 Cough: Secondary | ICD-10-CM

## 2014-01-06 ENCOUNTER — Other Ambulatory Visit: Payer: Self-pay | Admitting: Gastroenterology

## 2014-01-06 DIAGNOSIS — K7581 Nonalcoholic steatohepatitis (NASH): Secondary | ICD-10-CM

## 2014-01-27 ENCOUNTER — Other Ambulatory Visit: Payer: Managed Care, Other (non HMO)

## 2014-01-29 ENCOUNTER — Ambulatory Visit
Admission: RE | Admit: 2014-01-29 | Discharge: 2014-01-29 | Disposition: A | Discharge status: Home / Self Care | Payer: Managed Care, Other (non HMO) | Source: Ambulatory Visit | Attending: Gastroenterology | Admitting: Gastroenterology

## 2014-01-29 DIAGNOSIS — K7581 Nonalcoholic steatohepatitis (NASH): Secondary | ICD-10-CM

## 2014-03-02 ENCOUNTER — Other Ambulatory Visit: Payer: Self-pay | Admitting: Cardiovascular Disease

## 2014-03-02 NOTE — Telephone Encounter (Signed)
Rx(s) sent to pharmacy electronically. Message sent to [facility], Dr. Evette Georges scheduler, to contact patient for an OV

## 2014-03-05 ENCOUNTER — Telehealth: Payer: Self-pay | Admitting: Cardiovascular Disease

## 2014-03-11 NOTE — Telephone Encounter (Signed)
Closed encounter °

## 2014-03-28 ENCOUNTER — Other Ambulatory Visit: Payer: Self-pay | Admitting: Cardiovascular Disease

## 2014-03-30 NOTE — Telephone Encounter (Signed)
Rx(s) sent to pharmacy electronically.  

## 2015-01-21 ENCOUNTER — Other Ambulatory Visit: Payer: Self-pay

## 2015-01-21 DIAGNOSIS — Z1231 Encounter for screening mammogram for malignant neoplasm of breast: Secondary | ICD-10-CM

## 2015-02-22 ENCOUNTER — Ambulatory Visit
Admission: RE | Admit: 2015-02-22 | Discharge: 2015-02-22 | Disposition: A | Discharge status: Home / Self Care | Payer: BLUE CROSS/BLUE SHIELD | Source: Ambulatory Visit

## 2015-02-22 DIAGNOSIS — Z1231 Encounter for screening mammogram for malignant neoplasm of breast: Secondary | ICD-10-CM

## 2016-02-28 ENCOUNTER — Ambulatory Visit (INDEPENDENT_AMBULATORY_CARE_PROVIDER_SITE_OTHER): Payer: BLUE CROSS/BLUE SHIELD | Admitting: Cardiovascular Disease

## 2016-02-28 VITALS — BP 140/46 | HR 65 | Ht 69.0 in | Wt 255.8 lb

## 2016-02-28 DIAGNOSIS — E785 Hyperlipidemia, unspecified: Secondary | ICD-10-CM

## 2016-02-28 DIAGNOSIS — I10 Essential (primary) hypertension: Secondary | ICD-10-CM | POA: Diagnosis not present

## 2016-02-28 DIAGNOSIS — R0609 Other forms of dyspnea: Secondary | ICD-10-CM

## 2016-02-28 DIAGNOSIS — G4733 Obstructive sleep apnea (adult) (pediatric): Secondary | ICD-10-CM

## 2016-02-28 DIAGNOSIS — R001 Bradycardia, unspecified: Secondary | ICD-10-CM

## 2016-02-28 DIAGNOSIS — L4050 Arthropathic psoriasis, unspecified: Secondary | ICD-10-CM

## 2016-02-28 DIAGNOSIS — E119 Type 2 diabetes mellitus without complications: Secondary | ICD-10-CM

## 2016-02-28 DIAGNOSIS — Z720 Tobacco use: Secondary | ICD-10-CM

## 2016-02-28 DIAGNOSIS — R0602 Shortness of breath: Secondary | ICD-10-CM

## 2016-02-28 DIAGNOSIS — Z79899 Other long term (current) drug therapy: Secondary | ICD-10-CM

## 2016-02-28 DIAGNOSIS — E668 Other obesity: Secondary | ICD-10-CM

## 2016-02-28 DIAGNOSIS — Z794 Long term (current) use of insulin: Secondary | ICD-10-CM

## 2016-02-28 MED ORDER — SPIRONOLACTONE 25 MG PO TABS: 12.5000 mg | ORAL_TABLET | Freq: Every day | ORAL | 6 refills | Status: DC

## 2016-02-28 NOTE — Progress Notes (Signed)
Cardiology Office Note    Date:  02/29/2016   ID:  Kristy Dougherty, DOB Jan 23, 1951, MRN [id number]  PCP:  [name], MD  Cardiologist:  Shelva Majestic, MD   Chief Complaint  Patient presents with  . sleep    patient states that she falls asleep all day while at work.     History of Present Illness:  Kristy Dougherty is a 66 y.o. female who presents to the office  today to reestablish cardiology and sleep care.  I have not seen her since October 2014.  Kristy Dougherty has a history of obesity, hypertension, type 2 diabetes mellitus, marked mixed hyperlipidemia with an atherogenic profile, and severe obstructive sleep apnea.  In 2011, she had undergone a cardiac catheterization by Dr. Tina Griffiths and had normal coronary arteries.  In 2011.  She was referred for a sleep study and was found to have severe obstructive sleep apnea with an AHI of 68.3 per hour zone able to achieve from sleep.  She had significant oxygen desaturation to 74% and had loud snoring.  She underwent CPAP titration CPAP pressure of 12 cm water pressure was recommended.  Her DME company used to be [employer], but they have gone out of business.  She has not had supplies in several years.  Her mask is very old and des not have a good seal. She admits to 100% use. She typically goes to bed at 9:30 - 10 pm and wakes up at 5:30 am.  She goes to the bathroom typically one time per night.  She has not had a download done on her machine in over 3 years.   She admits to shortness of breath that has increased recently. She continues to smoke and has been smoking for at least 45 years currently up to 1 ppd. She has been on amlodipine 10 mg, lisinopril HCT 20/12.5 but she has been taking this twice a day, and Bystolic 5 mg.  She is diabetic on Levemir, Victoza and Tresiba, and for hyperlipidemia.  She has been taking Vytorin 10/20 in addition to Trilipix135 mg daily.  She has a peripheral neuropathy on gabapentin 300 mg twice a day.  She also  has psoriatic arthritis and has been taking methotrexate.  She denies any recent chest pressure.  She denies palpitations.  She presents to reestablish care.    Past Medical History:  Diagnosis Date  . Diabetes mellitus without complication ([place])   . Hyperlipemia   . Hypertension   . Normal coronary arteries 2011  . Obesity   . OSA on CPAP   . Shingles July 2014    Past Surgical History:  Procedure Laterality Date  . ABDOMINAL HYSTERECTOMY  1983  . CARDIAC CATHETERIZATION  04/02/2009   Normal coronary arteries  . US ECHOCARDIOGRAPHY  03/22/2009   EF =>55%,trace MR,mild TR & PI    Current Medications: Outpatient Medications Prior to Visit  Medication Sig Dispense Refill  . amLODipine (NORVASC) 10 MG tablet Take 1 tablet (10 mg total) by mouth daily. NEED APPOINTMENT FOR FUTURE REFILLS 30 tablet 0  . aspirin EC 81 MG tablet Take 81 mg by mouth daily.    . Choline Fenofibrate (TRILIPIX) 135 MG capsule Take 135 mg by mouth daily.    . cyclobenzaprine (FLEXERIL) 5 MG tablet Take 5 mg by mouth 3 (three) times daily as needed for muscle spasms.    Marland Kitchen esomeprazole (NEXIUM) 40 MG capsule Take 40 mg by mouth daily before breakfast.    .  ezetimibe-simvastatin (VYTORIN) 10-20 MG per tablet Take 1 tablet by mouth at bedtime.    Marland Kitchen FLUoxetine (PROZAC) 20 MG capsule Take 20 mg by mouth daily.    . folic acid (FOLVITE) 1 MG tablet Take 1 mg by mouth daily.    Marland Kitchen gabapentin (NEURONTIN) 100 MG capsule Take 3 capsules by mouth 2 (two) times daily.    . insulin aspart (NOVOLOG FLEXPEN) 100 UNIT/ML SOPN FlexPen Inject 15-20 Units into the skin 3 (three) times daily with meals. Sliding scale    . Insulin Detemir (LEVEMIR FLEXPEN [place]) Inject 52 Units into the skin 2 (two) times daily.    . Liraglutide (VICTOZA) 18 MG/3ML SOPN Inject 1.2 mLs into the skin every 30 (thirty) days.    Marland Kitchen lisinopril-hydrochlorothiazide (PRINZIDE,ZESTORETIC) 20-12.5 MG per tablet Take 1 tablet by mouth 2 (two) times daily.       . metFORMIN (GLUCOPHAGE) 1000 MG tablet Take 1,000 mg by mouth 2 (two) times daily with a meal.    . methotrexate (RHEUMATREX) 2.5 MG tablet Take 12 mg by mouth once a week. Tuesdays.  Caution:Chemotherapy. Protect from light.    . nebivolol (BYSTOLIC) 5 MG tablet Take 2.5 mg by mouth daily.    . traMADol (ULTRAM) 50 MG tablet Take 1 tablet by mouth as needed.    Marland Kitchen omega-3 acid ethyl esters (LOVAZA) 1 G capsule Take 2 capsules (2 g total) by mouth 2 (two) times daily. <PLEASE MAKE APPOINTMENT FOR REFILLS> 360 capsule 0   No facility-administered medications prior to visit.      Allergies:   Patient has no known allergies.   Social History   Social History  . Marital status: Widowed    Spouse name: N/A  . Number of children: N/A  . Years of education: N/A   Social History Main Topics  . Smoking status: Current Every Day Smoker    Packs/day: 0.75    Years: 40.00    Types: Cigarettes  . Smokeless tobacco: Never Used  . Alcohol use No  . Drug use: No  . Sexual activity: Not on file   Other Topics Concern  . Not on file   Social History Narrative  . No narrative on file    Additional social history is notable in that her husband passed away after developing sepsis.  She works for [employer] in the [employer].  She is smoking 3/4-1 pack of cigarettes per day and has been smoking for 45 to almost 50 years.  She has been widowed since 2010.  She has 1 daughter age 51 and 5 grandchildren.  Family History:  The patient's family history includes Cancer in her mother; Heart attack in her father.  Her mother had lung CA.  Father died in his 12s but had numerous heart problems.  ROS General: Negative; No fevers, chills, or night sweats;  HEENT: Negative; No changes in vision or hearing, sinus congestion, difficulty swallowing Pulmonary: Negative; No cough, wheezing, shortness of breath, hemoptysis Cardiovascular:  See HPI GI: Negative; No nausea, vomiting, diarrhea,  or abdominal pain GU: Negative; No dysuria, hematuria, or difficulty voiding Musculoskeletal: Negative; no myalgias, joint pain, or weakness Rheumatologic: Positive for psoriatic arthritis Hematologic/Oncology: Negative; no easy bruising, bleeding Endocrine: Positive for diabetes mellitus Neuro: Negative; no changes in balance, headaches Skin: Negative; No rashes or skin lesions Psychiatric: Negative; No behavioral problems, depression Sleep: Positive for severe obstructive sleep apnea with prior loud snoring, daytime sleepiness, hypersomnolence, no bruxism, restless legs, hypnogognic hallucinations, no cataplexy  Other comprehensive 14 point system review is negative.   PHYSICAL EXAM:   VS:  BP (!) 140/46   Pulse 65   Ht 5\' 9"  (1.753 m)   Wt 255 lb 12.8 oz (116 kg)   BMI 37.78 kg/m     Wt Readings from Last 3 Encounters:  02/28/16 255 lb 12.8 oz (116 kg)  11/01/12 240 lb 6.4 oz (109 kg)  10/29/12 233 lb (105.7 kg)    General: Alert, oriented, no distress.  Skin: normal turgor, no rashes, warm and dry HEENT: Normocephalic, atraumatic. Pupils equal round and reactive to light; sclera anicteric; extraocular muscles intact; Fundi Mild arterial narrowing.  No hemorrhages or exudates.  Disks flat Nose without nasal septal hypertrophy Mouth/Parynx: Elongated uvula; Mallinpatti scale 3 Neck: No JVD, no carotid bruits; normal carotid upstroke Lungs: clear to ausculatation and percussion; no wheezing or rales Chest wall: without tenderness to palpitation Heart: PMI not displaced, RRR, s1 s2 normal, 4-4/0 systolic murmu in the upper left sternal border, no diastolic murmur, no rubs, gallops, thrills, or heaves Abdomen: central adiposity soft, nontender; no hepatosplenomehaly, BS+; abdominal aorta nontender and not dilated by palpation. Back: no CVA tenderness Pulses 2+ Musculoskeletal: full range of motion, normal strength, no joint deformities Extremities: no clubbing cyanosis or edema,  Homan's sign negative  Neurologic: grossly nonfocal; Cranial nerves grossly wnl Psychologic: Normal mood and affect   Studies/Labs Reviewed:   EKG:  EKG is  ordered today.  The ekg independently reviewed by me shows sinus bradycardia at 47 bpm.  QTc interval 398 Kristy.  PR interval 192 Kristy.  Recent Labs: BMP Latest Ref Rng & Units 08/13/2012  Glucose 70 - 99 mg/dL 177(H)  BUN 6 - 23 mg/dL 21  Creatinine 0.50 - 1.10 mg/dL 1.02  Sodium 135 - 145 mEq/L 134(L)  Potassium 3.5 - 5.1 mEq/L 4.6  Chloride 96 - 112 mEq/L 95(L)  CO2 19 - 32 mEq/L 25  Calcium 8.4 - 10.5 mg/dL 10.1     No flowsheet data found.  CBC Latest Ref Rng & Units 08/13/2012  WBC 4.0 - 10.5 K/uL 11.9(H)  Hemoglobin 12.0 - 15.0 g/dL 13.0  Hematocrit 36.0 - 46.0 % 36.9  Platelets 150 - 400 K/uL 253   Lab Results  Component Value Date   MCV 90.0 08/13/2012   No results found for: TSH No results found for: HGBA1C   BNP No results found for: BNP  ProBNP No results found for: PROBNP   Lipid Panel  No results found for: CHOL, TRIG, HDL, CHOLHDL, VLDL, LDLCALC, LDLDIRECT   RADIOLOGY: No results found.   Additional studies/ records that were reviewed today include:  I reviewed the patient's diagnostic sleep study and CPAP titration studies from April and June 2011.  I reviewed prior medical records.  Also reviewed laboratory from [facility] from last year, which showed a BUN of 23 Cr 1.05.;  Normal LFTs.   ASSESSMENT:    1. Essential hypertension   2. Type 2 diabetes mellitus without complication, with long-term current use of insulin ([place])   3. Dyslipidemia   4. Obstructive sleep apnea syndrome   5. Drug therapy   6. SOB (shortness of breath)   7. Exertional dyspnea   8. Moderate obesity   9. Psoriatic arthritis ([place])   10. Sinus bradycardia   11. Tobacco abuse      PLAN:  Kristy Dougherty Is a 66 year old widowed Caucasian female who has significant cartilaginous with comorbidities including  hypertension, type 2 diabetes  mellitus, mixed hyperlipidemia, with remote triglyceride elevations in excess of 500 who has been diagnosed as having severe obstructive sleep apnea since 2011.  Since that time, she has been on CPAP therapy.  She has not had a DME company to provide her with supplies for the last several years since her prior company had gone out of business.  Her blood pressure today was mildly elevated and initially was 140/46, but on repeat by me was 160/78.  I discussed with her new guidelines.  She has been on amlodipine 10 mg, a daily dose of lisinopril HCT 40/25, as well as Bystolic 5 mg.  She is bradycardic with a pulse of 47.  I have suggested that she reduce her Bystolic to 2.5 mg daily.  I am adding aldosterone blockade with spironolactone initially at 12.5 mg daily, but this may need to be increased to twice a day depending upon her response.  I will try to obtain a download from her CPAP machine.  She did not have her machine with her today.  She may need dose adjustment of her CPAP or alternatively may even need a new evaluation.  We will try to set her up with a DME company and right a prescription for new supplies.  I have strongly recommended smoking cessation and provided counsel for this.  He has noticed some intermittent swelling which should be improved with spironolactone.  With her exertional shortness of breath, and coronal vascular comorbidities.  I'm also scheduling her for a 2-D echo Doppler study for further evaluation.  She will undergo complete set of fasting laboratory.  I will contact her regarding results and adjustments to her medications need to be changed.  I discussed omega-3 fatty acids.  She believes her last lipid studies done at [facility] still showed elevated triglycerides at 322, but she was not certain of this.  I will see her in 6-8 weeks for reevaluation.   Medication Adjustments/Labs and Tests Ordered: Current medicines are reviewed at length with the  patient today.  Concerns regarding medicines are outlined above.  Medication changes, Labs and Tests ordered today are listed in the Patient Instructions below.  Patient Instructions  Your physician has recommended you make the following change in your medication:   1.) the Bystolic has been decreased to 2.5 mg daily ( 1/2 tablet)  2.) start new prescription for spironolactone as directed on the bottle.  This prescription has been sent to your pharmacy.  Your physician recommends that you return for lab work FASTING.  Your physician has requested that you have an echocardiogram. Echocardiography is a painless test that uses sound waves to create images of your heart. It provides your doctor with information about the size and shape of your heart and how well your heart's chambers and valves are working. This procedure takes approximately one hour. There are no restrictions for this procedure. This will be done at the [facility].  Your physician recommends that you schedule a follow-up appointment in: 6-8 weeks with Dr Claiborne Billings.         Signed, Shelva Majestic, MD, [facility]  02/29/2016 8:19 AM    [facility] [address] [place], [place], [place]  [address] Phone: [phone]

## 2016-02-28 NOTE — Patient Instructions (Addendum)
Your physician has recommended you make the following change in your medication:   1.) the Bystolic has been decreased to 2.5 mg daily ( 1/2 tablet)  2.) start new prescription for spironolactone as directed on the bottle.  This prescription has been sent to your pharmacy.  Your physician recommends that you return for lab work FASTING.  Your physician has requested that you have an echocardiogram. Echocardiography is a painless test that uses sound waves to create images of your heart. It provides your doctor with information about the size and shape of your heart and how well your heart's chambers and valves are working. This procedure takes approximately one hour. There are no restrictions for this procedure. This will be done at the [facility].  Your physician recommends that you schedule a follow-up appointment in: 6-8 weeks with Dr Claiborne Billings.

## 2016-02-29 ENCOUNTER — Encounter: Payer: Self-pay | Admitting: Cardiovascular Disease

## 2016-03-01 ENCOUNTER — Telehealth: Payer: Self-pay | Admitting: Cardiovascular Disease

## 2016-03-01 NOTE — Telephone Encounter (Signed)
New message      Calling to give nurse DME info.  [employer] is the company and their fax number is [phone].

## 2016-03-09 NOTE — Telephone Encounter (Signed)
Orders with supporting documentation  for CPAP machine and supplies sent to [facility].

## 2016-03-13 ENCOUNTER — Ambulatory Visit (HOSPITAL_COMMUNITY): Payer: BLUE CROSS/BLUE SHIELD | Attending: Internal Medicine

## 2016-03-13 ENCOUNTER — Other Ambulatory Visit: Payer: Self-pay

## 2016-03-13 DIAGNOSIS — I340 Nonrheumatic mitral (valve) insufficiency: Secondary | ICD-10-CM | POA: Insufficient documentation

## 2016-03-13 DIAGNOSIS — I10 Essential (primary) hypertension: Secondary | ICD-10-CM | POA: Insufficient documentation

## 2016-03-13 DIAGNOSIS — E119 Type 2 diabetes mellitus without complications: Secondary | ICD-10-CM | POA: Insufficient documentation

## 2016-03-13 DIAGNOSIS — I071 Rheumatic tricuspid insufficiency: Secondary | ICD-10-CM | POA: Diagnosis not present

## 2016-03-13 DIAGNOSIS — R0602 Shortness of breath: Secondary | ICD-10-CM | POA: Insufficient documentation

## 2016-03-13 LAB — ECHOCARDIOGRAM COMPLETE
AVLVOTPG: 6 mmHg
CHL CUP DOP CALC LVOT VTI: 33.9 cm
CHL CUP RV SYS PRESS: 41 mmHg
CHL CUP TV REG PEAK VELOCITY: 309 cm/s
EERAT: 9.65
EWDT: 246 ms
FS: 30 % (ref 28–44)
IV/PV OW: 0.99
LA diam end sys: 46 mm
LA vol A4C: 62 ml
LA vol: 80 mL
LADIAMINDEX: 2.01 cm/m2
LASIZE: 46 mm
LAVOLIN: 34.9 mL/m2
LV E/e' medial: 9.65
LV E/e'average: 9.65
LV PW d: 15.1 mm — AB (ref 0.6–1.1)
LV TDI E'MEDIAL: 7.02
LV e' LATERAL: 11.5 cm/s
LVOT area: 3.14 cm2
LVOT peak vel: 125 cm/s
LVOTD: 20 mm
LVOTSV: 106 mL
Lateral S' vel: 14.4 cm/s
MV Dec: 246
MV Peak grad: 5 mmHg
MV pk E vel: 111 m/s
MVPKAVEL: 102 m/s
TDI e' lateral: 11.5
TRMAXVEL: 309 cm/s
TVPG: 309 mmHg

## 2016-03-16 ENCOUNTER — Telehealth: Payer: Self-pay | Admitting: Cardiovascular Disease

## 2016-03-16 NOTE — Telephone Encounter (Signed)
New message    [facility] is calling she states they need more information for the order they received before it can be given to pt.   What cpap settings to set the machine?-needs to be written on the order.  Fax number-[phone]

## 2016-03-17 ENCOUNTER — Telehealth: Payer: Self-pay | Admitting: *Deleted

## 2016-03-17 NOTE — Telephone Encounter (Signed)
Faxed CPAP settings/order to [facility].

## 2016-03-17 NOTE — Telephone Encounter (Signed)
Order faxed to [facility] today.

## 2016-03-21 ENCOUNTER — Other Ambulatory Visit: Payer: Self-pay | Admitting: Family Medicine

## 2016-03-21 DIAGNOSIS — Z1231 Encounter for screening mammogram for malignant neoplasm of breast: Secondary | ICD-10-CM

## 2016-04-03 ENCOUNTER — Ambulatory Visit
Admission: RE | Admit: 2016-04-03 | Discharge: 2016-04-03 | Disposition: A | Discharge status: Home / Self Care | Payer: BLUE CROSS/BLUE SHIELD | Source: Ambulatory Visit | Attending: Family Medicine | Admitting: Family Medicine

## 2016-04-03 DIAGNOSIS — Z1231 Encounter for screening mammogram for malignant neoplasm of breast: Secondary | ICD-10-CM

## 2016-04-26 ENCOUNTER — Encounter: Payer: Self-pay | Admitting: Cardiovascular Disease

## 2016-04-26 ENCOUNTER — Ambulatory Visit (INDEPENDENT_AMBULATORY_CARE_PROVIDER_SITE_OTHER): Payer: BLUE CROSS/BLUE SHIELD | Admitting: Cardiovascular Disease

## 2016-04-26 VITALS — BP 158/54 | HR 54 | Ht 69.0 in | Wt 254.0 lb

## 2016-04-26 DIAGNOSIS — Z79899 Other long term (current) drug therapy: Secondary | ICD-10-CM | POA: Diagnosis not present

## 2016-04-26 DIAGNOSIS — E119 Type 2 diabetes mellitus without complications: Secondary | ICD-10-CM | POA: Diagnosis not present

## 2016-04-26 DIAGNOSIS — G4733 Obstructive sleep apnea (adult) (pediatric): Secondary | ICD-10-CM

## 2016-04-26 DIAGNOSIS — E668 Other obesity: Secondary | ICD-10-CM

## 2016-04-26 DIAGNOSIS — Z794 Long term (current) use of insulin: Secondary | ICD-10-CM

## 2016-04-26 DIAGNOSIS — I10 Essential (primary) hypertension: Secondary | ICD-10-CM

## 2016-04-26 DIAGNOSIS — E785 Hyperlipidemia, unspecified: Secondary | ICD-10-CM

## 2016-04-26 DIAGNOSIS — K219 Gastro-esophageal reflux disease without esophagitis: Secondary | ICD-10-CM | POA: Diagnosis not present

## 2016-04-26 MED ORDER — SPIRONOLACTONE 25 MG PO TABS: 25.0000 mg | ORAL_TABLET | Freq: Two times a day (BID) | ORAL | 3 refills | Status: DC

## 2016-04-26 MED ORDER — ROSUVASTATIN CALCIUM 20 MG PO TABS: 20.0000 mg | ORAL_TABLET | Freq: Every day | ORAL | 3 refills | Status: DC

## 2016-04-26 NOTE — Patient Instructions (Addendum)
Your physician has recommended you make the following change in your medication:   1.) increase the the spirolactone to 1/2 tablet twice a day for  2 weeks. If your systolic blood pressure ( top #)  Continues to be greater than 130 then increase this to 1 tablet twice a day.  2.) STOP the Vytorin this has been replaced with Rouvastatin 20 mg. ( crestor)  3.) get some fish oil over the counter and take 2 capsules daily.   Your physician recommends that you return for lab work and office visit in: 3 months

## 2016-04-26 NOTE — Progress Notes (Signed)
Cardiology Office Note    Date:  04/28/2016   ID:  Kristy Dougherty, DOB 1950-02-15, MRN [id number]  PCP:  [name], MD  Cardiologist:  Shelva Majestic, MD   No chief complaint on file.   History of Present Illness:  Kristy Dougherty is a 66 y.o. female who presents to the office  today to reestablish cardiology and sleep care.  I had not seen her since October 2014 until her last visit in January 2018.  She presents for two-month follow-up evaluation.  Kristy Dougherty has a history of obesity, hypertension, type 2 diabetes mellitus, marked mixed hyperlipidemia with an atherogenic profile, and severe obstructive sleep apnea.  In 2011, she had undergone a cardiac catheterization by Dr. Tina Griffiths and had normal coronary arteries.  In 2011 she was referred for a sleep study and was found to have severe obstructive sleep apnea with an AHI of 68.3 per hour.  She had significant oxygen desaturation to 74% and had loud snoring.  She underwent CPAP titration CPAP pressure of 12 cm water pressure was recommended.  Her DME company used to be [employer], but they have gone out of business.  She has not had supplies in several years.  Her mask is very old and des not have a good seal. She admits to 100% use. She typically goes to bed at 9:30 - 10 pm and wakes up at 5:30 am.  She goes to the bathroom typically one time per night. Since I last saw her she recently established with [employer] for her CPAP supplies.  She admits to shortness of breath that has increased recently. She continues to smoke and has been smoking for at least 45 years currently up to 1 ppd. She has been on amlodipine 10 mg, lisinopril HCT 20/12.5 but she has been taking this twice a day, and Bystolic 5 mg.  She is diabetic on Levemir, Victoza and Tresiba, and for hyperlipidemia.  She has been taking Vytorin 10/20 in addition to Trilipix135 mg daily.  She has a peripheral neuropathy on gabapentin 300 mg twice a day.  She also has psoriatic arthritis  and has been taking methotrexate.  When I last saw her, she was hypertensive and bradycardic.  At that time, I reduced her Bystolic and added spironolactone 12.5 mg daily.  She also had lab work done on 03/21/16  lab and she had the results on her phone, which I reviewed with her in detail.  Her GFR was 48.  BUN 20, Cr 1.14.  Potassium 4.6, with the addition of spironolactone.  Hemoglobin A1c was increased at 6.9.  Total cholesterol 194, HDL 38, triglycerides 320, LDL 91, and non-HDL cholesterol 156.  She denies any recent chest pressure.  She denies palpitations.  She presents for follow-up cardiology evaluation after reestablishing care with me 2 months ago.   Past Medical History:  Diagnosis Date  . Diabetes mellitus without complication ([place])   . Hyperlipemia   . Hypertension   . Normal coronary arteries 2011  . Obesity   . OSA on CPAP   . Shingles July 2014    Past Surgical History:  Procedure Laterality Date  . ABDOMINAL HYSTERECTOMY  1983  . CARDIAC CATHETERIZATION  04/02/2009   Normal coronary arteries  . US ECHOCARDIOGRAPHY  03/22/2009   EF =>55%,trace MR,mild TR & PI    Current Medications: Outpatient Medications Prior to Visit  Medication Sig Dispense Refill  . amLODipine (NORVASC) 10 MG tablet Take 1 tablet (10 mg total) by  mouth daily. NEED APPOINTMENT FOR FUTURE REFILLS 30 tablet 0  . aspirin EC 81 MG tablet Take 81 mg by mouth daily.    . Choline Fenofibrate (TRILIPIX) 135 MG capsule Take 135 mg by mouth daily.    . cyclobenzaprine (FLEXERIL) 5 MG tablet Take 5 mg by mouth 3 (three) times daily as needed for muscle spasms.    Marland Kitchen esomeprazole (NEXIUM) 40 MG capsule Take 40 mg by mouth daily before breakfast.    . FLUoxetine (PROZAC) 20 MG capsule Take 20 mg by mouth daily.    . folic acid (FOLVITE) 1 MG tablet Take 1 mg by mouth daily.    Marland Kitchen gabapentin (NEURONTIN) 100 MG capsule Take 3 capsules by mouth 2 (two) times daily.    . Insulin Detemir (LEVEMIR FLEXPEN [place]) Inject  52 Units into the skin 2 (two) times daily.    . Liraglutide (VICTOZA) 18 MG/3ML SOPN Inject 1.2 mLs into the skin every 30 (thirty) days.    Marland Kitchen lisinopril-hydrochlorothiazide (PRINZIDE,ZESTORETIC) 20-12.5 MG per tablet Take 1 tablet by mouth 2 (two) times daily.     . metFORMIN (GLUCOPHAGE) 1000 MG tablet Take 1,000 mg by mouth 2 (two) times daily with a meal.    . methotrexate (RHEUMATREX) 2.5 MG tablet Take 12 mg by mouth once a week. Tuesdays.  Caution:Chemotherapy. Protect from light.    . nebivolol (BYSTOLIC) 5 MG tablet Take 2.5 mg by mouth daily.    . traMADol (ULTRAM) 50 MG tablet Take 1 tablet by mouth as needed.    . ezetimibe-simvastatin (VYTORIN) 10-20 MG per tablet Take 1 tablet by mouth at bedtime.    Marland Kitchen spironolactone (ALDACTONE) 25 MG tablet Take 0.5 tablets (12.5 mg total) by mouth daily. 30 tablet 6  . insulin aspart (NOVOLOG FLEXPEN) 100 UNIT/ML SOPN FlexPen Inject 15-20 Units into the skin 3 (three) times daily with meals. Sliding scale     No facility-administered medications prior to visit.      Allergies:   Patient has no known allergies.   Social History   Social History  . Marital status: Widowed    Spouse name: N/A  . Number of children: N/A  . Years of education: N/A   Social History Main Topics  . Smoking status: Current Every Day Smoker    Packs/day: 0.75    Years: 40.00    Types: Cigarettes  . Smokeless tobacco: Never Used  . Alcohol use No  . Drug use: No  . Sexual activity: Not Asked   Other Topics Concern  . None   Social History Narrative  . None    Additional social history is notable in that her husband passed away after developing sepsis.  She works for [employer] in the [employer].  She is smoking 3/4-1 pack of cigarettes per day and has been smoking for 45 to almost 50 years.  She has been widowed since 2010.  She has 1 daughter age 84 and 5 grandchildren.  Family History:  The patient's family history includes Cancer in  her mother; Heart attack in her father.  Her mother had lung CA.  Father died in his 15s but had numerous heart problems.  ROS General: Negative; No fevers, chills, or night sweats;  HEENT: Negative; No changes in vision or hearing, sinus congestion, difficulty swallowing Pulmonary: Negative; No cough, wheezing, shortness of breath, hemoptysis Cardiovascular:  See HPI GI: Negative; No nausea, vomiting, diarrhea, or abdominal pain GU: Negative; No dysuria, hematuria, or difficulty voiding Musculoskeletal:  Negative; no myalgias, joint pain, or weakness Rheumatologic: Positive for psoriatic arthritis Hematologic/Oncology: Negative; no easy bruising, bleeding Endocrine: Positive for diabetes mellitus Neuro: Negative; no changes in balance, headaches Skin: Negative; No rashes or skin lesions Psychiatric: Negative; No behavioral problems, depression Sleep: Positive for severe obstructive sleep apnea with prior loud snoring, daytime sleepiness, hypersomnolence, no bruxism, restless legs, hypnogognic hallucinations, no cataplexy Other comprehensive 14 point system review is negative.   PHYSICAL EXAM:   VS:  BP (!) 158/54   Pulse (!) 54   Ht _0  (1.753 m)   Wt 254 lb (115.2 kg)   BMI 37.51 kg/m     The blood pressure by me was 160/64  Wt Readings from Last 3 Encounters:  04/26/16 254 lb (115.2 kg)  02/28/16 255 lb 12.8 oz (116 kg)  11/01/12 240 lb 6.4 oz (109 kg)    General: Alert, oriented, no distress.  Skin: normal turgor, no rashes, warm and dry HEENT: Normocephalic, atraumatic. Pupils equal round and reactive to light; sclera anicteric; extraocular muscles intact; Fundi Mild arterial narrowing.  No hemorrhages or exudates.  Disks flat Nose without nasal septal hypertrophy Mouth/Parynx: Elongated uvula; Mallinpatti scale 3 Neck: No JVD, no carotid bruits; normal carotid upstroke Lungs: clear to ausculatation and percussion; no wheezing or rales Chest wall: without tenderness  to palpitation Heart: PMI not displaced, RRR, s1 s2 normal, 6-6/4 systolic murmu in the upper left sternal border, no diastolic murmur, no rubs, gallops, thrills, or heaves Abdomen: central adiposity soft, nontender; no hepatosplenomehaly, BS+; abdominal aorta nontender and not dilated by palpation. Back: no CVA tenderness Pulses 2+ Musculoskeletal: full range of motion, normal strength, no joint deformities Extremities: no clubbing cyanosis or edema, Homan's sign negative  Neurologic: grossly nonfocal; Cranial nerves grossly wnl Psychologic: Normal mood and affect   Studies/Labs Reviewed:   ECG (independently read by me): Sinus bradycardia 54 bpm.  No ectopy.  Normal intervals.  January 2018 EKG:  EKG is  ordered today.  The ekg independently reviewed by me shows sinus bradycardia at 47 bpm.  QTc interval 398 ms.  PR interval 192 ms.  Recent Labs: BMP Latest Ref Rng & Units 08/13/2012  Glucose 70 - 99 mg/dL 177(H)  BUN 6 - 23 mg/dL 21  Creatinine 0.50 - 1.10 mg/dL 1.02  Sodium 135 - 145 mEq/L 134(L)  Potassium 3.5 - 5.1 mEq/L 4.6  Chloride 96 - 112 mEq/L 95(L)  CO2 19 - 32 mEq/L 25  Calcium 8.4 - 10.5 mg/dL 10.1     No flowsheet data found.  CBC Latest Ref Rng & Units 08/13/2012  WBC 4.0 - 10.5 K/uL 11.9(H)  Hemoglobin 12.0 - 15.0 g/dL 13.0  Hematocrit 36.0 - 46.0 % 36.9  Platelets 150 - 400 K/uL 253   Lab Results  Component Value Date   MCV 90.0 08/13/2012   No results found for: TSH No results found for: HGBA1C   BNP No results found for: BNP  ProBNP No results found for: PROBNP   Lipid Panel  No results found for: CHOL, TRIG, HDL, CHOLHDL, VLDL, LDLCALC, LDLDIRECT   RADIOLOGY: Mm Digital Screening Bilateral  Result Date: 04/04/2016 CLINICAL DATA:  Screening. EXAM: DIGITAL SCREENING BILATERAL MAMMOGRAM WITH CAD COMPARISON:  Previous exam(s). ACR Breast Density Category b: There are scattered areas of fibroglandular density. FINDINGS: There are no findings  suspicious for malignancy. Images were processed with CAD. IMPRESSION: No mammographic evidence of malignancy. A result letter of this screening mammogram will be mailed directly to the  patient. RECOMMENDATION: Screening mammogram in one year. (Code:SM-B-01Y) BI-RADS CATEGORY  1: Negative. Electronically Signed   By: Dorise Bullion III M.D   On: 04/04/2016 08:51     Additional studies/ records that were reviewed today include:  I reviewed the patient's diagnostic sleep study and CPAP titration studies from April and June 2011.  I reviewed prior medical records.  Also reviewed laboratory from [facility] from last year, which showed a BUN of 23 Cr 1.05.;  Normal LFTs.  I personally review blood work from from 04/18/2016 done at [facility].   ASSESSMENT:    1. Essential hypertension   2. Dyslipidemia   3. Medication management   4. Obstructive sleep apnea syndrome   5. Moderate obesity   6. Type 2 diabetes mellitus without complication, with long-term current use of insulin ([place])   7. GERD without esophagitis      PLAN:  Ms Zaniah Titterington Is a 66 year old widowed Caucasian female who has significant cardiovascular comorbidities including hypertension, type 2 diabetes mellitus, mixed hyperlipidemia, with remote triglyceride elevations in excess of 500 and was diagnosed as having severe obstructive sleep apnea since 2011.  Since that time, she has been on CPAP therapy.  I last saw her, she had not had CPAP supplies several years since her DME company had gone out of business.  She has been set up with apnea and is now achieved.  New supplies.  She admits to 100% CPAP use.  She denies breakthrough snoring or residual daytime sleepiness.  When I saw HER-2 months ago when she reestablish care with me, she was hypertensive and bradycardic.  Dolichocephalic and started her on oral milligrams, and spironolactone 12.5 mg daily.  I have recommended further titration of spironolactone to 12.5 mg twice a day for  2 weeks.  If her blood pressure is still in excess of 1:30 after 2 weeks on this increased regimen, she will further titrate spironolactone to 25 mg twice a day.  She has been on Vytorin 10/20 for hyperlipidemia.  I reviewed her recent lipid studies.  I have recommended she discontinue Vytorin and in its place will start Crestor 20 mg daily.  She will continue taking trilipix 135 mg and  I also have recommended omega-3 fatty acids with a high concentration of DHA and EPA and she will initiate 2 capsules daily, particularly with her persistent hypertriglyceridemia.  She is diabetic on metformin and victoza.  GERD is controlled with Nexium.  She is moderately obese with a BMI of 37.51, weight loss and exercise was recommended.  She will  undergo repeat laboratory 2-3 months.  I will see her in follow-up and further recommendations will be made at that time.   Medication Adjustments/Labs and Tests Ordered: Current medicines are reviewed at length with the patient today.  Concerns regarding medicines are outlined above.  Medication changes, Labs and Tests ordered today are listed in the Patient Instructions below.  Patient Instructions  Your physician has recommended you make the following change in your medication:   1.) increase the the spirolactone to 1/2 tablet twice a day for  2 weeks. If your systolic blood pressure ( top #)  Continues to be greater than 130 then increase this to 1 tablet twice a day.  2.) STOP the Vytorin this has been replaced with Rouvastatin 20 mg. ( crestor)  3.) get some fish oil over the counter and take 2 capsules daily.   Your physician recommends that you return for lab work and office visit  in: 3 months        Signed, Shelva Majestic, MD, [facility]  04/28/2016 3:25 PM    [facility] [address] [place], [place], [place]  [address] Phone: [phone]

## 2016-07-28 ENCOUNTER — Ambulatory Visit (INDEPENDENT_AMBULATORY_CARE_PROVIDER_SITE_OTHER): Payer: BLUE CROSS/BLUE SHIELD | Admitting: Cardiovascular Disease

## 2016-07-28 ENCOUNTER — Encounter: Payer: Self-pay | Admitting: Cardiovascular Disease

## 2016-07-28 VITALS — BP 146/50 | HR 54 | Ht 68.0 in | Wt 247.0 lb

## 2016-07-28 DIAGNOSIS — Z794 Long term (current) use of insulin: Secondary | ICD-10-CM | POA: Diagnosis not present

## 2016-07-28 DIAGNOSIS — Z79899 Other long term (current) drug therapy: Secondary | ICD-10-CM

## 2016-07-28 DIAGNOSIS — I10 Essential (primary) hypertension: Secondary | ICD-10-CM

## 2016-07-28 DIAGNOSIS — E119 Type 2 diabetes mellitus without complications: Secondary | ICD-10-CM

## 2016-07-28 DIAGNOSIS — E785 Hyperlipidemia, unspecified: Secondary | ICD-10-CM

## 2016-07-28 DIAGNOSIS — G4733 Obstructive sleep apnea (adult) (pediatric): Secondary | ICD-10-CM

## 2016-07-28 MED ORDER — SPIRONOLACTONE 25 MG PO TABS: 25.0000 mg | ORAL_TABLET | Freq: Two times a day (BID) | ORAL | 3 refills | Status: DC

## 2016-07-28 MED ORDER — ROSUVASTATIN CALCIUM 40 MG PO TABS
40.0000 mg | ORAL_TABLET | Freq: Every day | ORAL | 3 refills | Status: DC
Start: 2016-07-28 — End: 2017-09-21

## 2016-07-28 MED ORDER — ICOSAPENT ETHYL 1 G PO CAPS: 2.0000 | ORAL_CAPSULE | Freq: Two times a day (BID) | ORAL | 3 refills | Status: DC

## 2016-07-28 NOTE — Progress Notes (Signed)
Cardiology Office Note    Date:  07/30/2016   ID:  Kristy Dougherty, DOB October 11, 1950, MRN [id number]  PCP:  [name], MD  Cardiologist:  Shelva Majestic, MD   Chief Complaint  Patient presents with  . Follow-up    3 MONTHS    History of Present Illness:  Kristy Dougherty is a 66 y.o. female who presents for a 3 month follow-up cardiology evaluation.  Kristy. [name] has a history of obesity, hypertension, type 2 diabetes mellitus, marked mixed hyperlipidemia with an atherogenic profile, and severe obstructive sleep apnea.  In 2011, she had undergone a cardiac catheterization by Dr. Tina Griffiths and had normal coronary arteries.  In 2011 she was referred for a sleep study and was found to have severe obstructive sleep apnea with an AHI of 68.3 per hour.  She had significant oxygen desaturation to 74% and had loud snoring.  She underwent CPAP titration CPAP pressure of 12 cm water pressure was recommended.  Her DME company used to be [employer], but they have gone out of business.  She has not had supplies in several years.  Her mask is very old and des not have a good seal. She admits to 100% use. She typically goes to bed at 9:30 - 10 pm and wakes up at 5:30 am.  She goes to the bathroom typically one time per night. Since I last saw her she recently established with [employer] for her CPAP supplies.  She admits to shortness of breath that has increased recently. She continues to smoke and has been smoking for at least 45 years currently up to 1 ppd. She has been on amlodipine 10 mg, lisinopril HCT 20/12.5 but she has been taking this twice a day, and Bystolic 5 mg.  She is diabetic on Levemir, Victoza and Tresiba, and for hyperlipidemia.  She has been taking Vytorin 10/20 in addition to Trilipix135 mg daily.  She has a peripheral neuropathy on gabapentin 300 mg twice a day.  She also has psoriatic arthritis and has been taking methotrexate.  When I  saw her In January 2018 she was hypertensive and  bradycardic.  At that time, I reduced her Bystolic and added spironolactone 12.5 mg daily.  She also had lab work done on 03/21/16  lab and she had the results on her phone, which I reviewed with her in detail.  Her GFR was 48.  BUN 20, Cr 1.14.  Potassium 4.6, with the addition of spironolactone.  Hemoglobin A1c was increased at 6.9.  Total cholesterol 194, HDL 38, triglycerides 320, LDL 91, and non-HDL cholesterol 156.  She denies any recent chest pressure.  She denies palpitations.   I saw her in follow-up in March 2018 and I recommended further increase of spironolactone to 12.5 twice a day with further titration to 25 mg twice a day.  Blood pressure remained elevated.  I discontinued Vytorin in light of her persistent elevation of lipid studies and started her on Crestor 20 mg to take in addition to her try lipids 135 mg and recommended omega-3 fatty acid supplementation.  She underwent follow-up laboratory by Dr. [name] on 06/19/2016.  BUN 22, chronic 1.05.  LFTs were normal.  Her total cholesterol was 203, HDL 37, triglycerides had increased to 440 and non-HDL cholesterol was 166.  She presents for reevaluation  Past Medical History:  Diagnosis Date  . Diabetes mellitus without complication ([place])   . Hyperlipemia   . Hypertension   . Normal coronary arteries  2011  . Obesity   . OSA on CPAP   . Shingles July 2014    Past Surgical History:  Procedure Laterality Date  . ABDOMINAL HYSTERECTOMY  1983  . CARDIAC CATHETERIZATION  04/02/2009   Normal coronary arteries  . US ECHOCARDIOGRAPHY  03/22/2009   EF =>55%,trace MR,mild TR & PI    Current Medications: Outpatient Medications Prior to Visit  Medication Sig Dispense Refill  . Albiglutide (TANZEUM) 50 MG PEN Inject 50 mg into the skin once a week.    Marland Kitchen amLODipine (NORVASC) 10 MG tablet Take 1 tablet (10 mg total) by mouth daily. NEED APPOINTMENT FOR FUTURE REFILLS 30 tablet 0  . aspirin EC 81 MG tablet Take 81 mg by mouth daily.      . Choline Fenofibrate (TRILIPIX) 135 MG capsule Take 135 mg by mouth daily.    . cyclobenzaprine (FLEXERIL) 5 MG tablet Take 5 mg by mouth 3 (three) times daily as needed for muscle spasms.    Marland Kitchen esomeprazole (NEXIUM) 40 MG capsule Take 40 mg by mouth daily before breakfast.    . FLUoxetine (PROZAC) 20 MG capsule Take 20 mg by mouth daily.    . folic acid (FOLVITE) 1 MG tablet Take 1 mg by mouth daily.    Marland Kitchen gabapentin (NEURONTIN) 100 MG capsule Take 3 capsules by mouth 2 (two) times daily.    . Insulin Detemir (LEVEMIR FLEXPEN [place]) Inject 52 Units into the skin 2 (two) times daily.    . Liraglutide (VICTOZA) 18 MG/3ML SOPN Inject 1.2 mLs into the skin every 30 (thirty) days.    Marland Kitchen lisinopril-hydrochlorothiazide (PRINZIDE,ZESTORETIC) 20-12.5 MG per tablet Take 1 tablet by mouth 2 (two) times daily.     . metFORMIN (GLUCOPHAGE) 1000 MG tablet Take 1,000 mg by mouth 2 (two) times daily with a meal.    . methotrexate (RHEUMATREX) 2.5 MG tablet Take 12 mg by mouth once a week. Tuesdays.  Caution:Chemotherapy. Protect from light.    . nebivolol (BYSTOLIC) 5 MG tablet Take 2.5 mg by mouth daily.    . traMADol (ULTRAM) 50 MG tablet Take 1 tablet by mouth as needed.    . TRESIBA FLEXTOUCH 200 UNIT/ML SOPN Inject 140 mLs as directed as directed.    Marland Kitchen spironolactone (ALDACTONE) 25 MG tablet Take 1 tablet (25 mg total) by mouth 2 (two) times daily. 180 tablet 3  . rosuvastatin (CRESTOR) 20 MG tablet Take 1 tablet (20 mg total) by mouth daily. 90 tablet 3   No facility-administered medications prior to visit.      Allergies:   Patient has no known allergies.   Social History   Social History  . Marital status: Widowed    Spouse name: N/A  . Number of children: N/A  . Years of education: N/A   Social History Main Topics  . Smoking status: Current Every Day Smoker    Packs/day: 0.75    Years: 40.00    Types: Cigarettes  . Smokeless tobacco: Never Used  . Alcohol use No  . Drug use: No  . Sexual  activity: Not Asked   Other Topics Concern  . None   Social History Narrative  . None    Additional social history is notable in that her husband passed away after developing sepsis.  She works for [employer] in the [employer].  She is smoking 3/4-1 pack of cigarettes per day and has been smoking for 45 to almost 50 years.  She has been widowed since  2010.  She has 1 daughter age 64 and 5 grandchildren.  Family History:  The patient's family history includes Cancer in her mother; Heart attack in her father.  Her mother had lung CA.  Father died in his 20s but had numerous heart problems.  ROS General: Negative; No fevers, chills, or night sweats;  HEENT: Negative; No changes in vision or hearing, sinus congestion, difficulty swallowing Pulmonary: Negative; No cough, wheezing, shortness of breath, hemoptysis Cardiovascular:  See HPI GI: Negative; No nausea, vomiting, diarrhea, or abdominal pain GU: Negative; No dysuria, hematuria, or difficulty voiding Musculoskeletal: Negative; no myalgias, joint pain, or weakness Rheumatologic: Positive for psoriatic arthritis Hematologic/Oncology: Negative; no easy bruising, bleeding Endocrine: Positive for diabetes mellitus Neuro: Negative; no changes in balance, headaches Skin: Negative; No rashes or skin lesions Psychiatric: Negative; No behavioral problems, depression Sleep: Positive for severe obstructive sleep apnea with prior loud snoring, daytime sleepiness, hypersomnolence, no bruxism, restless legs, hypnogognic hallucinations, no cataplexy Other comprehensive 14 point system review is negative.   PHYSICAL EXAM:   VS:  BP (!) 146/50   Pulse (!) 54   Ht 5\' 8"  (1.727 m) Comment: 5 8  Wt 247 lb (112 kg)   BMI 37.56 kg/m     The blood pressure by me was 146/68  Wt Readings from Last 3 Encounters:  07/28/16 247 lb (112 kg)  04/26/16 254 lb (115.2 kg)  02/28/16 255 lb 12.8 oz (116 kg)       Physical Exam BP (!)  146/50   Pulse (!) 54   Ht 5\' 8"  (1.727 m) Comment: 5 8  Wt 247 lb (112 kg)   BMI 37.56 kg/m  General: Alert, oriented, no distress.  Skin: normal turgor, no rashes, warm and dry HEENT: Normocephalic, atraumatic. Pupils equal round and reactive to light; sclera anicteric; extraocular muscles intact;  Nose without nasal septal hypertrophy Mouth/Parynx benign; Mallinpatti scale 3 Neck: No JVD, no carotid bruits; normal carotid upstroke Lungs: clear to ausculatation and percussion; no wheezing or rales Chest wall: without tenderness to palpitation Heart: PMI not displaced, RRR, s1 s2 normal, 2/6 systolic murmur, no diastolic murmur, no rubs, gallops, thrills, or heaves Abdomen: Moderate central adiposity soft, nontender; no hepatosplenomehaly, BS+; abdominal aorta nontender and not dilated by palpation. Back: no CVA tenderness Pulses 2+ Musculoskeletal: full range of motion, normal strength, no joint deformities Extremities: no clubbing cyanosis or edema, Homan's sign negative  Neurologic: grossly nonfocal; Cranial nerves grossly wnl Psychologic: Normal mood and affect; normal cognition   Studies/Labs Reviewed:   ECG (independently read by me): Sinus bradycardia 54 bpm.  PR interval 196, QTc interval 398 Kristy.  No ST segment changes.  March 2018 ECG (independently read by me): Sinus bradycardia 54 bpm.  No ectopy.  Normal intervals.  January 2018 EKG:  EKG is  ordered today.  The ekg independently reviewed by me shows sinus bradycardia at 47 bpm.  QTc interval 398 Kristy.  PR interval 192 Kristy.  Recent Labs: BMP Latest Ref Rng & Units 08/13/2012  Glucose 70 - 99 mg/dL 177(H)  BUN 6 - 23 mg/dL 21  Creatinine 0.50 - 1.10 mg/dL 1.02  Sodium 135 - 145 mEq/L 134(L)  Potassium 3.5 - 5.1 mEq/L 4.6  Chloride 96 - 112 mEq/L 95(L)  CO2 19 - 32 mEq/L 25  Calcium 8.4 - 10.5 mg/dL 10.1     No flowsheet data found.  CBC Latest Ref Rng & Units 08/13/2012  WBC 4.0 - 10.5 K/uL 11.9(H)  Hemoglobin  12.0 - 15.0 g/dL 13.0  Hematocrit 36.0 - 46.0 % 36.9  Platelets 150 - 400 K/uL 253   Lab Results  Component Value Date   MCV 90.0 08/13/2012   No results found for: TSH No results found for: HGBA1C   BNP No results found for: BNP  ProBNP No results found for: PROBNP   Lipid Panel  No results found for: CHOL, TRIG, HDL, CHOLHDL, VLDL, LDLCALC, LDLDIRECT   RADIOLOGY: No results found.   Additional studies/ records that were reviewed today include:  I reviewed the patient's diagnostic sleep study and CPAP titration studies from April and June 2011.  I reviewed prior medical records.  Also reviewed [facility], which showed a BUN of 23 Cr 1.05.;  Normal LFTs.  I personally review blood work from from 04/18/2016 done at [facility].  I reviewed the recent blood work from 06/19/2016 by Dr. [name] as noted above.   ASSESSMENT:    1. Essential hypertension   2. Dyslipidemia   3. Medication management   4. Morbid obesity ([place])   5. Obstructive sleep apnea syndrome   6. Type 2 diabetes mellitus without complication, with long-term current use of insulin [facility])      PLAN:  Kristy Dougherty Is a 66 year old widowed Caucasian female who has significant cardiovascular comorbidities including hypertension, type 2 diabetes mellitus, mixed hyperlipidemia, with remote triglyceride elevations in excess of 500 and was diagnosed as having severe obstructive sleep apnea since 2011.  Since that time, she has been on CPAP therapy.  She now admits to 100% CPAP compliance and has been reset up with a DME company to obtain supplies.  When she had reestablished with me, she was bradycardic and hypertensive.  I have adjusted her medications, which now include amlodipine 10 mg, Bystolic 2.5 mg, lisinopril HCT 20/12.5, spironolactone 25 mg twice a day for blood pressure control.  Her blood pressure today is improved.  The last saw her, I initiated Crestor in place of Vytorin and  had her continue fenofibrate for her mixed hyperlipidemia.  I reviewed her most recent laboratory.  Triglycerides remain significantly elevated at 440.  I have recommended further titration of Crestor up to 40 mg and have written her prescription for Vascepa 2 capsules twice a day.  We discussed weight loss and improved diet.  On her echo Doppler study in February 2018.  She had normal systolic function with moderate LVH and PA pressure elevation at 41 mm.  She is diabetic on Victoza, metformin, and Levemir insulin.  She will undergo repeat blood work in 3 months and I will see her in 4 months for reevaluation.  Medication Adjustments/Labs and Tests Ordered: Current medicines are reviewed at length with the patient today.  Concerns regarding medicines are outlined above.  Medication changes, Labs and Tests ordered today are listed in the Patient Instructions below.  Patient Instructions  Your physician has recommended you make the following change in your medication:   1.) the crestor has been to 40 mg from 20 mg daily.  2.) start new prescription given for Vascepa.  Your physician recommends that you return for lab work in: 3 months. Slips provided today.  Your physician recommends that you schedule a follow-up appointment in: 4 months.      Signed, Shelva Majestic, MD, [facility]  07/30/2016 [facility] [address], [place], [place], [place]  [address] Phone: [phone]

## 2016-07-28 NOTE — Patient Instructions (Signed)
Your physician has recommended you make the following change in your medication:   1.) the crestor has been to 40 mg from 20 mg daily.  2.) start new prescription given for Vascepa.  Your physician recommends that you return for lab work in: 3 months. Slips provided today.  Your physician recommends that you schedule a follow-up appointment in: 4 months.

## 2016-11-21 ENCOUNTER — Encounter: Payer: Self-pay | Admitting: Cardiovascular Disease

## 2016-11-21 ENCOUNTER — Ambulatory Visit (INDEPENDENT_AMBULATORY_CARE_PROVIDER_SITE_OTHER): Payer: BLUE CROSS/BLUE SHIELD | Admitting: Cardiovascular Disease

## 2016-11-21 VITALS — BP 164/50 | HR 56 | Ht 68.0 in | Wt 249.0 lb

## 2016-11-21 DIAGNOSIS — Z794 Long term (current) use of insulin: Secondary | ICD-10-CM | POA: Diagnosis not present

## 2016-11-21 DIAGNOSIS — G4733 Obstructive sleep apnea (adult) (pediatric): Secondary | ICD-10-CM

## 2016-11-21 DIAGNOSIS — I10 Essential (primary) hypertension: Secondary | ICD-10-CM | POA: Diagnosis not present

## 2016-11-21 DIAGNOSIS — E119 Type 2 diabetes mellitus without complications: Secondary | ICD-10-CM

## 2016-11-21 DIAGNOSIS — E785 Hyperlipidemia, unspecified: Secondary | ICD-10-CM

## 2016-11-21 NOTE — Progress Notes (Signed)
Cardiology Office Note    Date:  11/21/2016   ID:  Kristy Dougherty, DOB [birth year], MRN [id number]  PCP:  [name], MD  Cardiologist:  Shelva Majestic, MD   Chief Complaint  Patient presents with  . Follow-up    no chest pain    History of Present Illness:  Kristy Dougherty is a 66 y.o. female who presents for a 3 month follow-up cardiology evaluation.  Kristy Dougherty has a history of obesity, hypertension, type 2 diabetes mellitus, marked mixed hyperlipidemia with an atherogenic profile, and severe obstructive sleep apnea.  In 2011, she had undergone a cardiac catheterization by Dr. Tina Griffiths and had normal coronary arteries.  In 2011 she was referred for a sleep study and was found to have severe obstructive sleep apnea with an AHI of 68.3 per hour.  She had significant oxygen desaturation to 74% and had loud snoring.  She underwent CPAP titration CPAP pressure of 12 cm water pressure was recommended.  Her DME company used to be [employer], but they have gone out of business.  She has not had supplies in several years.  Her mask is very old and des not have a good seal. She admits to 100% use. She typically goes to bed at 9:30 - 10 pm and wakes up at 5:30 am.  She goes to the bathroom typically one time per night. Since I last saw her she recently established with [employer] for her CPAP supplies.  She admits to shortness of breath that has increased recently. She continues to smoke and has been smoking for at least 45 years currently up to 1 ppd. She has been on amlodipine 10 mg, lisinopril HCT 20/12.5 but she has been taking this twice a day, and Bystolic 5 mg.  She is diabetic on Levemir, Victoza and Tresiba, and for hyperlipidemia.  She has been taking Vytorin 10/20 in addition to Trilipix135 mg daily.  She has a peripheral neuropathy on gabapentin 300 mg twice a day.  She also has psoriatic arthritis and has been taking methotrexate.  When I  saw her In January 2018 she was hypertensive and  bradycardic.  At that time, I reduced her Bystolic and added spironolactone 12.5 mg daily.  She also had lab work done on 03/21/16  lab and she had the results on her phone, which I reviewed with her in detail.  Her GFR was 48.  BUN 20, Cr 1.14.  Potassium 4.6, with the addition of spironolactone.  Hemoglobin A1c was increased at 6.9.  Total cholesterol 194, HDL 38, triglycerides 320, LDL 91, and non-HDL cholesterol 156.  She denies any recent chest pressure.  She denies palpitations.   When I saw her in March 2018  I recommended further increase of spironolactone from 12.5 twice a day with further titration to 25 mg twice a day.  Blood pressure remained elevated.  I discontinued Vytorin in light of her persistent elevation of lipid studies and started her on Crestor 20 mg to take in addition to her try lipids 135 mg and recommended omega-3 fatty acid supplementation.  She underwent follow-up laboratory by Dr. [name] on 06/19/2016.  BUN 22, chronic 1.05.  LFTs were normal.  Her total cholesterol was 203, HDL 37, triglycerides had increased to 440 and non-HDL cholesterol was 166.  Since I last saw her, unfortunate, she continues to smoke three quarters a pack of cigarettes per day.  She states that she has not been very rested with her diet.  She does not get much exercise.  She does have some hip discomfort, which she blames secondary to Crestor.  She had seen Dr. [name]. .  She has been on a reduced dose of amlodipine at just 5 mg due to some prior ankle swelling but she believes the ankle swelling resolved once she increased spironolactone.  Follow-up blood work on 10/25/2016 showed a total cholesterol 160, HDL 33, LDL 66, but triglycerides remain significantly elevated at 436.  Hemoglobin A1c was 7.1.  Calcium was 10.4.  She had stable renal function.  She denies any chest tightness.  She continues to use CPAP with 100% compliance.  She is diabetic. She presents for reevaluation  Past Medical History:    Diagnosis Date  . Diabetes mellitus without complication ([place])   . Hyperlipemia   . Hypertension   . Normal coronary arteries 2011  . Obesity   . OSA on CPAP   . Shingles July 2014    Past Surgical History:  Procedure Laterality Date  . ABDOMINAL HYSTERECTOMY  1983  . CARDIAC CATHETERIZATION  04/02/2009   Normal coronary arteries  . US ECHOCARDIOGRAPHY  03/22/2009   EF =>55%,trace MR,mild TR & PI    Current Medications: Outpatient Medications Prior to Visit  Medication Sig Dispense Refill  . Albiglutide (TANZEUM) 50 MG PEN Inject 50 mg into the skin once a week.    Marland Kitchen amLODipine (NORVASC) 10 MG tablet Take 1 tablet (10 mg total) by mouth daily. NEED APPOINTMENT FOR FUTURE REFILLS 30 tablet 0  . aspirin EC 81 MG tablet Take 81 mg by mouth daily.    . Choline Fenofibrate (TRILIPIX) 135 MG capsule Take 135 mg by mouth daily.    . cyclobenzaprine (FLEXERIL) 5 MG tablet Take 5 mg by mouth 3 (three) times daily as needed for muscle spasms.    Marland Kitchen esomeprazole (NEXIUM) 40 MG capsule Take 40 mg by mouth daily before breakfast.    . FLUoxetine (PROZAC) 20 MG capsule Take 20 mg by mouth daily.    . folic acid (FOLVITE) 1 MG tablet Take 1 mg by mouth daily.    Marland Kitchen gabapentin (NEURONTIN) 100 MG capsule Take 3 capsules by mouth 2 (two) times daily.    Vanessa Kick Ethyl 1 g CAPS Take 2 capsules by mouth 2 (two) times daily. 360 capsule 3  . lisinopril-hydrochlorothiazide (PRINZIDE,ZESTORETIC) 20-12.5 MG per tablet Take 1 tablet by mouth 2 (two) times daily.     . metFORMIN (GLUCOPHAGE) 1000 MG tablet Take 1,000 mg by mouth 2 (two) times daily with a meal.    . methotrexate (RHEUMATREX) 2.5 MG tablet Take 12 mg by mouth once a week. Tuesdays.  Caution:Chemotherapy. Protect from light.    . nebivolol (BYSTOLIC) 5 MG tablet Take 2.5 mg by mouth daily.    . rosuvastatin (CRESTOR) 40 MG tablet Take 1 tablet (40 mg total) by mouth daily. 90 tablet 3  . spironolactone (ALDACTONE) 25 MG tablet Take 1 tablet  (25 mg total) by mouth 2 (two) times daily. 180 tablet 3  . traMADol (ULTRAM) 50 MG tablet Take 1 tablet by mouth as needed.    . TRESIBA FLEXTOUCH 200 UNIT/ML SOPN Inject 140 mLs as directed as directed.    . Insulin Detemir (LEVEMIR FLEXPEN [place]) Inject 52 Units into the skin 2 (two) times daily.    . Liraglutide (VICTOZA) 18 MG/3ML SOPN Inject 1.2 mLs into the skin every 30 (thirty) days.     No facility-administered medications prior to visit.  Allergies:   Patient has no known allergies.   Social History   Social History  . Marital status: Widowed    Spouse name: N/A  . Number of children: N/A  . Years of education: N/A   Social History Main Topics  . Smoking status: Current Every Day Smoker    Packs/day: 0.75    Years: 40.00    Types: Cigarettes  . Smokeless tobacco: Never Used  . Alcohol use No  . Drug use: No  . Sexual activity: Not Asked   Other Topics Concern  . None   Social History Narrative  . None    Additional social history is notable in that her husband passed away after developing sepsis.  She works for [employer] in the [employer].  She is smoking 3/4-1 pack of cigarettes per day and has been smoking for 45 to almost 50 years.  She has been widowed since 2010.  She has 1 daughter age 48 and 5 grandchildren.  Family History:  The patient's family history includes Cancer in her mother; Heart attack in her father.  Her mother had lung CA.  Father died in his 80s but had numerous heart problems.  ROS General: Negative; No fevers, chills, or night sweats;  HEENT: Negative; No changes in vision or hearing, sinus congestion, difficulty swallowing Pulmonary: Negative; No cough, wheezing, shortness of breath, hemoptysis Cardiovascular:  See HPI GI: Negative; No nausea, vomiting, diarrhea, or abdominal pain GU: Negative; No dysuria, hematuria, or difficulty voiding Musculoskeletal: Negative; no myalgias, joint pain, or weakness Rheumatologic:  Positive for psoriatic arthritis Hematologic/Oncology: Negative; no easy bruising, bleeding Endocrine: Positive for diabetes mellitus Neuro: Negative; no changes in balance, headaches Skin: Negative; No rashes or skin lesions Psychiatric: Negative; No behavioral problems, depression Sleep: Positive for severe obstructive sleep apnea with prior loud snoring, daytime sleepiness, hypersomnolence, no bruxism, restless legs, hypnogognic hallucinations, no cataplexy Other comprehensive 14 point system review is negative.   PHYSICAL EXAM:   VS:  BP (!) 164/50   Pulse (!) 56   Ht 5\' 8"  (1.727 m)   Wt 249 lb (112.9 kg)   BMI 37.86 kg/m     Repeat blood pressure by me was 172/70  Wt Readings from Last 3 Encounters:  11/21/16 249 lb (112.9 kg)  07/28/16 247 lb (112 kg)  04/26/16 254 lb (115.2 kg)    General: Alert, oriented, no distress.  Skin: normal turgor, no rashes, warm and dry HEENT: Normocephalic, atraumatic. Pupils equal round and reactive to light; sclera anicteric; extraocular muscles intact;  Nose without nasal septal hypertrophy Mouth/Parynx benign; Mallinpatti scale 3 Neck: No JVD, no carotid bruits; normal carotid upstroke Lungs: clear to ausculatation and percussion; no wheezing or rales Chest wall: without tenderness to palpitation Heart: PMI not displaced, RRR, s1 s2 normal, 2/6 systolic murmur, no diastolic murmur, no rubs, gallops, thrills, or heaves Abdomen: Moderate central adiposity soft, nontender; no hepatosplenomehaly, BS+; abdominal aorta nontender and not dilated by palpation. Back: no CVA tenderness Pulses 2+ Musculoskeletal: full range of motion, normal strength, no joint deformities Extremities: no clubbing cyanosis or edema, Homan's sign negative  Neurologic: grossly nonfocal; Cranial nerves grossly wnl Psychologic: Normal mood and affect    Studies/Labs Reviewed:   ECG (independently read by me): Sinus bradycardia 56 bpm.  Normal intervals.  No  ectopy.  ECG (independently read by me): Sinus bradycardia 54 bpm.  PR interval 196, QTc interval 398 Kristy.  No ST segment changes.  March 2018 ECG (independently read  by me): Sinus bradycardia 54 bpm.  No ectopy.  Normal intervals.  January 2018 EKG:  EKG is  ordered today.  The ekg independently reviewed by me shows sinus bradycardia at 47 bpm.  QTc interval 398 Kristy.  PR interval 192 Kristy.  Recent Labs: BMP Latest Ref Rng & Units 08/13/2012  Glucose 70 - 99 mg/dL 177(H)  BUN 6 - 23 mg/dL 21  Creatinine 0.50 - 1.10 mg/dL 1.02  Sodium 135 - 145 mEq/L 134(L)  Potassium 3.5 - 5.1 mEq/L 4.6  Chloride 96 - 112 mEq/L 95(L)  CO2 19 - 32 mEq/L 25  Calcium 8.4 - 10.5 mg/dL 10.1     No flowsheet data found.  CBC Latest Ref Rng & Units 08/13/2012  WBC 4.0 - 10.5 K/uL 11.9(H)  Hemoglobin 12.0 - 15.0 g/dL 13.0  Hematocrit 36.0 - 46.0 % 36.9  Platelets 150 - 400 K/uL 253   Lab Results  Component Value Date   MCV 90.0 08/13/2012   No results found for: TSH No results found for: HGBA1C   BNP No results found for: BNP  ProBNP No results found for: PROBNP   Lipid Panel  No results found for: CHOL, TRIG, HDL, CHOLHDL, VLDL, LDLCALC, LDLDIRECT   RADIOLOGY: No results found.   Additional studies/ records that were reviewed today include:  I reviewed the patient's diagnostic sleep study and CPAP titration studies from April and June 2011.  I reviewed prior medical records.  Also reviewed laboratory from [facility] from last year, which showed a BUN of 23 Cr 1.05.;  Normal LFTs.  I personally review blood work from from 04/18/2016 done at [place].  I reviewed the recent blood work from 06/19/2016 by Dr. [name] as noted above.   ASSESSMENT:    1. Essential hypertension   2. Dyslipidemia   3. Morbid obesity ([place])   4. Obstructive sleep apnea syndrome   5. Type 2 diabetes mellitus without complication, with long-term current use of insulin [facility])      PLAN:  Kristy Dougherty Is a  66 year old widowed Caucasian female who has significant cardiovascular comorbidities including hypertension, type 2 diabetes mellitus, mixed hyperlipidemia, with remote triglyceride elevations in excess of 500 and severe obstructive sleep apnea since 2011.  Since that time, she has been on CPAP therapy.  She admits to 100% CPAP compliance and has been reset up with a DME company to obtain supplies.  Her blood pressure today continues to be elevated despite taking amlodipine 5 mg, lisinopril HCT 01/41, Bystolic 2.5 mg, and spironolactone 25 mg twice a day.  I recommended further increase of amlodipine back to 10 mg daily.  If this cannot control her blood pressure with ideal blood pressure less than 130/80, hydralazine will need to be added to her regimen.  I reviewed her recent laboratory done by her primary physician.  She continues to be on Crestor 40 mg as well as fenofibrate 135 mg and is also on Vascepa 2 capsules twice a day.  Triglycerides continue to be significantly elevated at 436.  I suspect a significant component of her continued elevation is poor diet.   I had discussion concerning improved diet with reduction and sweets and carbohydrates as well as increased exercise.  Her pulse is now in the 50s on very low-dose Bystolic at 2.5 mg.  She is not having significant edema.  Presently.  I again had a long discussion regarding pleat discontinuance of tobacco.  She is not having any chest pain.  She is  on a multiple regimen for her diabetes mellitus followed by Dr. [name] including metformin, and Treshiba and has been on insulin.  She will see Dr. [name] back in 2-3 months.  I will see her in 4 months for follow-up cardiology evaluation.    Medication Adjustments/Labs and Tests Ordered: Current medicines are reviewed at length with the patient today.  Concerns regarding medicines are outlined above.  Medication changes, Labs and Tests ordered today are listed in the Patient Instructions  below.  Patient Instructions  Medication Instructions:  INCREASE- Amlodipine 10 mg daily  If you need a refill on your cardiac medications before your next appointment, please call your pharmacy.  Labwork: None Ordered   Testing/Procedures: None Ordered  Follow-Up: Your physician wants you to follow-up in: 4 Months with dr Claiborne Billings   Thank you for choosing [facility] at [facility]!!        Signed, Shelva Majestic, MD, [facility]  11/21/2016 9:38 AM    [place] [address], [place], [place], [place]  [address] Phone: [phone]

## 2016-11-21 NOTE — Patient Instructions (Signed)
Medication Instructions:  INCREASE- Amlodipine 10 mg daily  If you need a refill on your cardiac medications before your next appointment, please call your pharmacy.  Labwork: None Ordered   Testing/Procedures: None Ordered  Follow-Up: Your physician wants you to follow-up in: 4 Months with dr Claiborne Billings   Thank you for choosing [facility] at [facility]!!

## 2017-06-27 ENCOUNTER — Encounter: Payer: Self-pay | Admitting: Internal Medicine

## 2017-08-10 ENCOUNTER — Other Ambulatory Visit: Payer: Self-pay | Admitting: Physician Assistant

## 2017-08-10 DIAGNOSIS — R197 Diarrhea, unspecified: Secondary | ICD-10-CM

## 2017-08-10 DIAGNOSIS — R109 Unspecified abdominal pain: Secondary | ICD-10-CM

## 2017-08-10 DIAGNOSIS — R112 Nausea with vomiting, unspecified: Secondary | ICD-10-CM

## 2017-08-19 ENCOUNTER — Other Ambulatory Visit: Payer: Self-pay | Admitting: Cardiovascular Disease

## 2017-08-20 NOTE — Telephone Encounter (Signed)
Rx sent to pharmacy   

## 2017-08-22 ENCOUNTER — Ambulatory Visit
Admission: RE | Admit: 2017-08-22 | Discharge: 2017-08-22 | Disposition: A | Discharge status: Home / Self Care | Payer: BLUE CROSS/BLUE SHIELD | Source: Ambulatory Visit | Attending: Physician Assistant | Admitting: Physician Assistant

## 2017-08-22 DIAGNOSIS — R112 Nausea with vomiting, unspecified: Secondary | ICD-10-CM

## 2017-08-22 DIAGNOSIS — R197 Diarrhea, unspecified: Secondary | ICD-10-CM

## 2017-08-22 DIAGNOSIS — R109 Unspecified abdominal pain: Secondary | ICD-10-CM

## 2017-08-22 MED ORDER — IOPAMIDOL (ISOVUE-300) INJECTION 61%
100.0000 mL | Freq: Once | INTRAVENOUS | Status: AC | PRN
  Administered 2017-08-22: 100 mL via INTRAVENOUS

## 2017-09-21 ENCOUNTER — Other Ambulatory Visit: Payer: Self-pay | Admitting: Cardiovascular Disease

## 2017-11-07 ENCOUNTER — Other Ambulatory Visit: Payer: Self-pay | Admitting: Family Medicine

## 2017-11-07 ENCOUNTER — Observation Stay (HOSPITAL_COMMUNITY)
Admission: EM | Admit: 2017-11-07 | Discharge: 2017-11-08 | Disposition: A | Discharge status: Home / Self Care | Payer: BLUE CROSS/BLUE SHIELD | Attending: Internal Medicine | Admitting: Internal Medicine

## 2017-11-07 ENCOUNTER — Ambulatory Visit
Admission: RE | Admit: 2017-11-07 | Discharge: 2017-11-07 | Disposition: A | Discharge status: Home / Self Care | Payer: BLUE CROSS/BLUE SHIELD | Source: Ambulatory Visit | Attending: Family Medicine | Admitting: Family Medicine

## 2017-11-07 ENCOUNTER — Encounter (HOSPITAL_COMMUNITY): Payer: Self-pay | Admitting: Emergency Medicine

## 2017-11-07 ENCOUNTER — Other Ambulatory Visit: Payer: Self-pay

## 2017-11-07 DIAGNOSIS — F329 Major depressive disorder, single episode, unspecified: Secondary | ICD-10-CM | POA: Insufficient documentation

## 2017-11-07 DIAGNOSIS — F17200 Nicotine dependence, unspecified, uncomplicated: Secondary | ICD-10-CM | POA: Diagnosis present

## 2017-11-07 DIAGNOSIS — E875 Hyperkalemia: Secondary | ICD-10-CM | POA: Diagnosis present

## 2017-11-07 DIAGNOSIS — Z7982 Long term (current) use of aspirin: Secondary | ICD-10-CM | POA: Diagnosis not present

## 2017-11-07 DIAGNOSIS — Z794 Long term (current) use of insulin: Secondary | ICD-10-CM | POA: Diagnosis not present

## 2017-11-07 DIAGNOSIS — E1122 Type 2 diabetes mellitus with diabetic chronic kidney disease: Secondary | ICD-10-CM | POA: Insufficient documentation

## 2017-11-07 DIAGNOSIS — L4050 Arthropathic psoriasis, unspecified: Secondary | ICD-10-CM | POA: Diagnosis not present

## 2017-11-07 DIAGNOSIS — Z6836 Body mass index (BMI) 36.0-36.9, adult: Secondary | ICD-10-CM | POA: Diagnosis not present

## 2017-11-07 DIAGNOSIS — G4733 Obstructive sleep apnea (adult) (pediatric): Secondary | ICD-10-CM | POA: Insufficient documentation

## 2017-11-07 DIAGNOSIS — Z801 Family history of malignant neoplasm of trachea, bronchus and lung: Secondary | ICD-10-CM | POA: Diagnosis not present

## 2017-11-07 DIAGNOSIS — E785 Hyperlipidemia, unspecified: Secondary | ICD-10-CM | POA: Diagnosis not present

## 2017-11-07 DIAGNOSIS — J449 Chronic obstructive pulmonary disease, unspecified: Secondary | ICD-10-CM | POA: Diagnosis not present

## 2017-11-07 DIAGNOSIS — Z8249 Family history of ischemic heart disease and other diseases of the circulatory system: Secondary | ICD-10-CM | POA: Insufficient documentation

## 2017-11-07 DIAGNOSIS — Z8619 Personal history of other infectious and parasitic diseases: Secondary | ICD-10-CM | POA: Diagnosis not present

## 2017-11-07 DIAGNOSIS — G4730 Sleep apnea, unspecified: Secondary | ICD-10-CM | POA: Diagnosis present

## 2017-11-07 DIAGNOSIS — Z90710 Acquired absence of both cervix and uterus: Secondary | ICD-10-CM | POA: Insufficient documentation

## 2017-11-07 DIAGNOSIS — Z79899 Other long term (current) drug therapy: Secondary | ICD-10-CM | POA: Insufficient documentation

## 2017-11-07 DIAGNOSIS — I129 Hypertensive chronic kidney disease with stage 1 through stage 4 chronic kidney disease, or unspecified chronic kidney disease: Secondary | ICD-10-CM | POA: Insufficient documentation

## 2017-11-07 DIAGNOSIS — Z7952 Long term (current) use of systemic steroids: Secondary | ICD-10-CM | POA: Insufficient documentation

## 2017-11-07 DIAGNOSIS — N179 Acute kidney failure, unspecified: Secondary | ICD-10-CM | POA: Diagnosis present

## 2017-11-07 DIAGNOSIS — F419 Anxiety disorder, unspecified: Secondary | ICD-10-CM | POA: Diagnosis not present

## 2017-11-07 DIAGNOSIS — E669 Obesity, unspecified: Secondary | ICD-10-CM | POA: Diagnosis present

## 2017-11-07 DIAGNOSIS — F17210 Nicotine dependence, cigarettes, uncomplicated: Secondary | ICD-10-CM | POA: Diagnosis not present

## 2017-11-07 DIAGNOSIS — R0600 Dyspnea, unspecified: Secondary | ICD-10-CM

## 2017-11-07 DIAGNOSIS — E119 Type 2 diabetes mellitus without complications: Secondary | ICD-10-CM | POA: Diagnosis not present

## 2017-11-07 DIAGNOSIS — K279 Peptic ulcer, site unspecified, unspecified as acute or chronic, without hemorrhage or perforation: Secondary | ICD-10-CM | POA: Diagnosis not present

## 2017-11-07 DIAGNOSIS — N183 Chronic kidney disease, stage 3 (moderate): Secondary | ICD-10-CM | POA: Diagnosis not present

## 2017-11-07 DIAGNOSIS — D649 Anemia, unspecified: Secondary | ICD-10-CM | POA: Diagnosis not present

## 2017-11-07 DIAGNOSIS — E782 Mixed hyperlipidemia: Secondary | ICD-10-CM | POA: Diagnosis not present

## 2017-11-07 LAB — IRON AND TIBC
Iron: 21 ug/dL — ABNORMAL LOW (ref 28–170)
SATURATION RATIOS: 4 % — AB (ref 10.4–31.8)
TIBC: 599 ug/dL — AB (ref 250–450)
UIBC: 578 ug/dL

## 2017-11-07 LAB — GLUCOSE, CAPILLARY: Glucose-Capillary: 168 mg/dL — ABNORMAL HIGH (ref 70–99)

## 2017-11-07 LAB — PROTIME-INR
INR: 1.18
PROTHROMBIN TIME: 14.9 s (ref 11.4–15.2)

## 2017-11-07 LAB — COMPREHENSIVE METABOLIC PANEL
ALBUMIN: 3 g/dL — AB (ref 3.5–5.0)
ALK PHOS: 73 U/L (ref 38–126)
ALT: 12 U/L (ref 0–44)
ANION GAP: 9 (ref 5–15)
AST: 26 U/L (ref 15–41)
BUN: 40 mg/dL — AB (ref 8–23)
CALCIUM: 8.6 mg/dL — AB (ref 8.9–10.3)
CO2: 21 mmol/L — ABNORMAL LOW (ref 22–32)
Chloride: 106 mmol/L (ref 98–111)
Creatinine, Ser: 1.89 mg/dL — ABNORMAL HIGH (ref 0.44–1.00)
GFR calc Af Amer: 31 mL/min — ABNORMAL LOW (ref >60)
GFR calc non Af Amer: 27 mL/min — ABNORMAL LOW (ref >60)
Glucose, Bld: 160 mg/dL — ABNORMAL HIGH (ref 70–99)
Potassium: 6.5 mmol/L (ref 3.5–5.1)
SODIUM: 136 mmol/L (ref 135–145)
Total Bilirubin: 0.4 mg/dL (ref 0.3–1.2)
Total Protein: 6.6 g/dL (ref 6.5–8.1)

## 2017-11-07 LAB — RETICULOCYTES
Immature Retic Fract: 15.1 % (ref 2.3–15.9)
RBC.: 2.52 MIL/uL — AB (ref 3.87–5.11)
Retic Count, Absolute: 78.8 10*3/uL (ref 19.0–186.0)
Retic Ct Pct: 3.1 % (ref 0.4–3.1)

## 2017-11-07 LAB — FOLATE: FOLATE: 29 ng/mL (ref >5.9)

## 2017-11-07 LAB — POC OCCULT BLOOD, ED: FECAL OCCULT BLD: NEGATIVE

## 2017-11-07 LAB — CBC
HEMATOCRIT: 23.9 % — AB (ref 36.0–46.0)
HEMOGLOBIN: 6.6 g/dL — AB (ref 12.0–15.0)
MCH: 25 pg — AB (ref 26.0–34.0)
MCHC: 27.2 g/dL — AB (ref 30.0–36.0)
MCV: 91.9 fL (ref 80.0–100.0)
Platelets: 267 10*3/uL (ref 150–400)
RBC: 2.6 MIL/uL — ABNORMAL LOW (ref 3.87–5.11)
RDW: 18.8 % — ABNORMAL HIGH (ref 11.5–15.5)
WBC: 10.1 10*3/uL (ref 4.0–10.5)

## 2017-11-07 LAB — VITAMIN B12: VITAMIN B 12: 305 pg/mL (ref 180–914)

## 2017-11-07 LAB — FERRITIN: Ferritin: 7 ng/mL — ABNORMAL LOW (ref 11–307)

## 2017-11-07 LAB — ABO/RH: ABO/RH(D): B POS

## 2017-11-07 LAB — APTT: APTT: 29 s (ref 24–36)

## 2017-11-07 LAB — PREPARE RBC (CROSSMATCH)

## 2017-11-07 MED ORDER — ONDANSETRON HCL 4 MG/2ML IJ SOLN: 4.0000 mg | Freq: Four times a day (QID) | INTRAMUSCULAR | Status: DC | PRN

## 2017-11-07 MED ORDER — GABAPENTIN 300 MG PO CAPS
300.0000 mg | ORAL_CAPSULE | Freq: Every day | ORAL | Status: DC
  Administered 2017-11-07: 300 mg via ORAL
  Filled 2017-11-07: qty 1

## 2017-11-07 MED ORDER — ALBUTEROL SULFATE (2.5 MG/3ML) 0.083% IN NEBU
10.0000 mg | INHALATION_SOLUTION | Freq: Once | RESPIRATORY_TRACT | Status: AC
  Administered 2017-11-07: 10 mg via RESPIRATORY_TRACT
  Filled 2017-11-07: qty 12

## 2017-11-07 MED ORDER — FENOFIBRATE 160 MG PO TABS
160.0000 mg | ORAL_TABLET | Freq: Every day | ORAL | Status: DC
  Administered 2017-11-08: 160 mg via ORAL
  Filled 2017-11-07: qty 1

## 2017-11-07 MED ORDER — INSULIN ASPART 100 UNIT/ML ~~LOC~~ SOLN: 0.0000 [IU] | Freq: Every day | SUBCUTANEOUS | Status: DC

## 2017-11-07 MED ORDER — ACETAMINOPHEN 325 MG PO TABS: 650.0000 mg | ORAL_TABLET | Freq: Four times a day (QID) | ORAL | Status: DC | PRN

## 2017-11-07 MED ORDER — ARFORMOTEROL TARTRATE 15 MCG/2ML IN NEBU
15.0000 ug | INHALATION_SOLUTION | Freq: Two times a day (BID) | RESPIRATORY_TRACT | Status: DC
  Administered 2017-11-08: 15 ug via RESPIRATORY_TRACT
  Filled 2017-11-07: qty 2

## 2017-11-07 MED ORDER — INSULIN ASPART 100 UNIT/ML ~~LOC~~ SOLN
0.0000 [IU] | Freq: Three times a day (TID) | SUBCUTANEOUS | Status: DC
  Administered 2017-11-08: 3 [IU] via SUBCUTANEOUS

## 2017-11-07 MED ORDER — ACETAMINOPHEN 650 MG RE SUPP: 650.0000 mg | Freq: Four times a day (QID) | RECTAL | Status: DC | PRN

## 2017-11-07 MED ORDER — INSULIN ASPART 100 UNIT/ML IV SOLN
10.0000 [IU] | Freq: Once | INTRAVENOUS | Status: AC
  Administered 2017-11-07: 10 [IU] via INTRAVENOUS
  Filled 2017-11-07: qty 0.1

## 2017-11-07 MED ORDER — PANTOPRAZOLE SODIUM 40 MG IV SOLR
40.0000 mg | Freq: Two times a day (BID) | INTRAVENOUS | Status: DC
  Administered 2017-11-07 – 2017-11-08 (×2): 40 mg via INTRAVENOUS
  Filled 2017-11-07 (×2): qty 40

## 2017-11-07 MED ORDER — NICOTINE 21 MG/24HR TD PT24
21.0000 mg | MEDICATED_PATCH | Freq: Every day | TRANSDERMAL | Status: DC
  Administered 2017-11-07 – 2017-11-08 (×2): 21 mg via TRANSDERMAL
  Filled 2017-11-07 (×2): qty 1

## 2017-11-07 MED ORDER — AMLODIPINE BESYLATE 5 MG PO TABS
5.0000 mg | ORAL_TABLET | Freq: Every day | ORAL | Status: DC
  Administered 2017-11-08: 5 mg via ORAL
  Filled 2017-11-07: qty 1

## 2017-11-07 MED ORDER — NICOTINE 21 MG/24HR TD PT24: 21.0000 mg | MEDICATED_PATCH | Freq: Every day | TRANSDERMAL | Status: DC

## 2017-11-07 MED ORDER — ONDANSETRON HCL 4 MG PO TABS: 4.0000 mg | ORAL_TABLET | Freq: Four times a day (QID) | ORAL | Status: DC | PRN

## 2017-11-07 MED ORDER — CYCLOBENZAPRINE HCL 5 MG PO TABS: 5.0000 mg | ORAL_TABLET | Freq: Three times a day (TID) | ORAL | Status: DC | PRN

## 2017-11-07 MED ORDER — ALBUTEROL SULFATE (2.5 MG/3ML) 0.083% IN NEBU: 2.5000 mg | INHALATION_SOLUTION | Freq: Four times a day (QID) | RESPIRATORY_TRACT | Status: DC

## 2017-11-07 MED ORDER — BUDESONIDE 0.5 MG/2ML IN SUSP
0.5000 mg | Freq: Two times a day (BID) | RESPIRATORY_TRACT | Status: DC
  Administered 2017-11-08: 0.5 mg via RESPIRATORY_TRACT
  Filled 2017-11-07: qty 2

## 2017-11-07 MED ORDER — SODIUM CHLORIDE 0.9% FLUSH
3.0000 mL | Freq: Two times a day (BID) | INTRAVENOUS | Status: DC
  Administered 2017-11-08: 3 mL via INTRAVENOUS

## 2017-11-07 MED ORDER — FLUOXETINE HCL 20 MG PO CAPS
20.0000 mg | ORAL_CAPSULE | Freq: Every day | ORAL | Status: DC
  Administered 2017-11-08: 20 mg via ORAL
  Filled 2017-11-07: qty 1

## 2017-11-07 MED ORDER — FENOFIBRIC ACID 135 MG PO CPDR: 135.0000 mg | DELAYED_RELEASE_CAPSULE | Freq: Every day | ORAL | Status: DC

## 2017-11-07 MED ORDER — SODIUM CHLORIDE 0.9 % IV SOLN
INTRAVENOUS | Status: DC
  Administered 2017-11-07: 22:00:00 via INTRAVENOUS

## 2017-11-07 MED ORDER — SODIUM CHLORIDE 0.9 % IV SOLN: 10.0000 mL/h | Freq: Once | INTRAVENOUS | Status: DC

## 2017-11-07 MED ORDER — SODIUM CHLORIDE 0.9 % IV BOLUS: 1000.0000 mL | Freq: Once | INTRAVENOUS | Status: DC

## 2017-11-07 MED ORDER — FLUTICASONE PROPIONATE 50 MCG/ACT NA SUSP: 1.0000 | Freq: Every day | NASAL | Status: DC | PRN

## 2017-11-07 MED ORDER — DEXTROSE 50 % IV SOLN
1.0000 | Freq: Once | INTRAVENOUS | Status: AC
  Administered 2017-11-07: 50 mL via INTRAVENOUS
  Filled 2017-11-07: qty 50

## 2017-11-07 MED ORDER — ROSUVASTATIN CALCIUM 40 MG PO TABS
40.0000 mg | ORAL_TABLET | Freq: Every day | ORAL | Status: DC
  Administered 2017-11-08: 40 mg via ORAL
  Filled 2017-11-07: qty 1
  Filled 2017-11-07: qty 2

## 2017-11-07 MED ORDER — FOLIC ACID 1 MG PO TABS
1.0000 mg | ORAL_TABLET | Freq: Every day | ORAL | Status: DC
  Administered 2017-11-08: 1 mg via ORAL
  Filled 2017-11-07: qty 1

## 2017-11-07 MED ORDER — NEBIVOLOL HCL 2.5 MG PO TABS
2.5000 mg | ORAL_TABLET | Freq: Every day | ORAL | Status: DC
  Administered 2017-11-07: 2.5 mg via ORAL
  Filled 2017-11-07: qty 1

## 2017-11-07 MED ORDER — LEVALBUTEROL HCL 0.63 MG/3ML IN NEBU: 0.6300 mg | INHALATION_SOLUTION | Freq: Four times a day (QID) | RESPIRATORY_TRACT | Status: DC | PRN

## 2017-11-07 MED ORDER — TRAMADOL HCL 50 MG PO TABS: 50.0000 mg | ORAL_TABLET | Freq: Four times a day (QID) | ORAL | Status: DC | PRN

## 2017-11-07 NOTE — ED Notes (Signed)
No reply for vitals

## 2017-11-07 NOTE — ED Notes (Signed)
Per RRT- ok to admin 10mg  of albuterol neb over less than an hour.

## 2017-11-07 NOTE — ED Notes (Signed)
Critical value called at 18:32

## 2017-11-07 NOTE — ED Triage Notes (Signed)
Pt reports sob that started about one week ago with generalized weakness. Pt reports dizziness upon position changes. Pt's doctor sent her here for low blood count. Denies N/V/D. No blood in stool. Denies pain. Not on blood thinners.

## 2017-11-07 NOTE — Plan of Care (Signed)

## 2017-11-07 NOTE — ED Provider Notes (Signed)
[facility] EMERGENCY DEPARTMENT Provider Note   CSN: [id number] Arrival date & time: 11/07/17  1656     History   Chief Complaint Chief Complaint  Patient presents with  . Abnormal Lab  . Fatigue    HPI Kristy Dougherty is a 67 y.o. female possible history of diabetes, hyperlipidemia, hypertension, obesity, OSA, ulcer who presents for evaluation of progressively worsening shortness of breath and fatigue and a low hemoglobin.  She was seen by her primary care doctor earlier today for evaluation of her symptoms.  Patient states that her primary care doctor called her and told her that her blood level was low and that she needed to go to the emergency department for further evaluation.  She states she went to her primary care doctor earlier today because she has been having a weeklong history of progressive worsening shortness of breath.  She noticed that when she walked out to her trash cans from her house, she got very fatigued and short of breath.  She states that was abnormal for her.  She denies any chest pain or leg swelling.  She states that she was recently diagnosed with a peptic ulcer in June 2018.  She sees [facility] GI and was started on Nexium.  She reports that she had one episode of anemia in July but states that it resolved on its own.  She does report that she has been taking her Nexium as directed.  She does currently smoke approximate 1 pack of cigarettes a day.  Denies any NSAID use.  She has not noticed any black or tarry stools or bright red bleeding from her rectum.  She has never required a transfusion before.  Patient denies any fevers, chest pain, nausea/vomiting.  She has had some generalized abdominal pain which she stated similar to the pain she has had with the ulcers.  Currently denying any abdominal pain.  The history is provided by the patient.    Past Medical History:  Diagnosis Date  . Diabetes mellitus without complication ([place])   .  Hyperlipemia   . Hypertension   . Normal coronary arteries 2011  . Obesity   . OSA on CPAP   . Shingles July 2014    Patient Active Problem List   Diagnosis Date Noted  . Symptomatic anemia 11/07/2017  . Chest pain with moderate risk of acute coronary syndrome 08/29/2012  . Diabetes mellitus ([place]) 08/29/2012  . HTN (hypertension) 08/29/2012  . Dyslipidemia 08/29/2012  . Obesity 08/29/2012  . Sleep apnea- on C-pap 08/29/2012  . Smoker 08/29/2012  . Family history of coronary artery disease 08/29/2012  . Shingles outbreak 08/29/2012  . Normal coronary arteries- 2011 08/29/2012  . Dyspnea 08/29/2012    Past Surgical History:  Procedure Laterality Date  . ABDOMINAL HYSTERECTOMY  1983  . CARDIAC CATHETERIZATION  04/02/2009   Normal coronary arteries  . US ECHOCARDIOGRAPHY  03/22/2009   EF =>55%,trace MR,mild TR & PI     OB History   None      Home Medications    Prior to Admission medications   Medication Sig Start Date End Date Taking? Authorizing Provider  acetaminophen (TYLENOL) 500 MG tablet Take 500-1,000 mg by mouth every 6 (six) hours as needed (for pain).   Yes [provider]  albuterol (PROVENTIL HFA) 108 (90 Base) MCG/ACT inhaler Inhale 2 puffs into the lungs every 6 (six) hours as needed for wheezing or shortness of breath.   Yes [provider]  amLODipine (NORVASC) 10 MG tablet Take 1 tablet (10 mg total) by mouth daily. NEED APPOINTMENT FOR FUTURE REFILLS Patient taking differently: Take 5 mg by mouth daily.  03/30/14  Yes Troy Sine, MD  aspirin EC 81 MG tablet Take 81 mg by mouth at bedtime.    Yes [provider]  budesonide-formoterol (SYMBICORT) 160-4.5 MCG/ACT inhaler Inhale 2 puffs into the lungs 2 (two) times daily.   Yes [provider]  Choline Fenofibrate (FENOFIBRIC ACID) 135 MG CPDR Take 135 mg by mouth daily.   Yes [provider]  cyclobenzaprine (FLEXERIL) 5 MG tablet Take 5 mg by mouth 3 (three)  times daily as needed for muscle spasms.   Yes [provider]  esomeprazole (NEXIUM) 40 MG capsule Take 40 mg by mouth daily before breakfast.   Yes [provider]  FLUoxetine (PROZAC) 20 MG capsule Take 20 mg by mouth daily.   Yes [provider]  fluticasone (FLONASE) 50 MCG/ACT nasal spray Place 1-2 sprays into both nostrils daily as needed for allergies or rhinitis.   Yes [provider]  folic acid (FOLVITE) 1 MG tablet Take 1 mg by mouth daily.   Yes [provider]  gabapentin (NEURONTIN) 100 MG capsule Take 300 mg by mouth at bedtime.  10/20/12  Yes [provider]  Icosapent Ethyl 1 g CAPS Take 2 capsules by mouth 2 (two) times daily. 07/28/16  Yes Troy Sine, MD  lisinopril-hydrochlorothiazide (PRINZIDE,ZESTORETIC) 20-12.5 MG per tablet Take 1 tablet by mouth 2 (two) times daily.    Yes [provider]  metFORMIN (GLUCOPHAGE) 1000 MG tablet Take 1,000 mg by mouth 2 (two) times daily with a meal.   Yes [provider]  methotrexate (RHEUMATREX) 2.5 MG tablet Take 15 mg by mouth every Tuesday. Caution:Chemotherapy. Protect from light.   Yes [provider]  nebivolol (BYSTOLIC) 5 MG tablet Take 2.5 mg by mouth at bedtime.    Yes [provider]  rosuvastatin (CRESTOR) 40 MG tablet TAKE 1 TABLET BY MOUTH EVERY DAY Patient taking differently: Take 40 mg by mouth daily.  09/21/17  Yes Troy Sine, MD  spironolactone (ALDACTONE) 25 MG tablet Take 1 tablet (25 mg total) by mouth 2 (two) times daily. Please call to make appointment for further refills. Patient taking differently: Take 25 mg by mouth 2 (two) times daily.  08/20/17  Yes Troy Sine, MD  traMADol (ULTRAM) 50 MG tablet Take 50 mg by mouth every 6 (six) hours as needed (for pain).  10/14/12  Yes [provider]  TRESIBA FLEXTOUCH 200 UNIT/ML SOPN Inject 125 Units into the skin daily before breakfast.  02/01/16  Yes [provider]    Family History Family History  Problem Relation Age of Onset  . Cancer Mother        lung  . Heart attack Father     Social History Social History   Tobacco Use  . Smoking status: Current Every Day Smoker    Packs/day: 1.00    Years: 40.00    Pack years: 40.00    Types: Cigarettes  . Smokeless tobacco: Never Used  Substance Use Topics  . Alcohol use: No  . Drug use: No     Allergies   Patient has no known allergies.   Review of Systems Review of Systems  Constitutional: Positive for fatigue. Negative for chills and fever.  HENT: Negative for congestion.   Eyes: Negative for visual disturbance.  Respiratory:  Positive for shortness of breath. Negative for cough.   Cardiovascular: Negative for chest pain.  Gastrointestinal: Negative for abdominal pain, blood in stool, diarrhea, nausea and vomiting.  Endocrine:       Negative ecchymosis  Genitourinary: Negative for dysuria and hematuria.  Skin: Negative for rash.  Neurological: Negative for dizziness, weakness, numbness and headaches.  All other systems reviewed and are negative.    Physical Exam Updated Vital Signs BP (!) 162/56 (BP Location: Left Arm)   Pulse 68   Temp 98 F (36.7 C) (Oral)   Resp 20   Ht 5\' 9"  (1.753 m)   Wt 112.9 kg   SpO2 95%   BMI 36.77 kg/m   Physical Exam  Constitutional: She is oriented to person, place, and time. She appears well-developed and well-nourished.  HENT:  Head: Normocephalic and atraumatic.  Mouth/Throat: Oropharynx is clear and moist and mucous membranes are normal.  Eyes: Pupils are equal, round, and reactive to light. Conjunctivae, EOM and lids are normal.  Pale conjunctiva bilaterally   Neck: Full passive range of motion without pain.  Cardiovascular: Normal rate, regular rhythm, normal heart sounds and normal pulses. Exam reveals no gallop and no friction rub.  No murmur heard. Pulmonary/Chest: Effort normal. She has wheezes.  Mild  wheezing noted.No evidence of respiratory distress.   Abdominal: Soft. Normal appearance. There is no tenderness. There is no rigidity and no guarding.  Abdomen is soft, non-distended, non-tender. No rigidity, No guarding. No peritoneal signs.  Genitourinary: Rectum normal. Rectal exam shows guaiac negative stool.  Genitourinary Comments: The exam was performed with a chaperone present. Digital Rectal Exam reveals sphincter with good tone. No external hemorrhoids. No masses or fissures. Stool color is brown with no overt blood.  Musculoskeletal: Normal range of motion.  Neurological: She is alert and oriented to person, place, and time.  Skin: Skin is warm and dry. Capillary refill takes less than 2 seconds.  Psychiatric: She has a normal mood and affect. Her speech is normal.  Nursing note and vitals reviewed.    ED Treatments / Results  Labs (all labs ordered are listed, but only abnormal results are displayed) Labs Reviewed  COMPREHENSIVE METABOLIC PANEL - Abnormal; Notable for the following components:      Result Value   Potassium 6.5 (*)    CO2 21 (*)    Glucose, Bld 160 (*)    BUN 40 (*)    Creatinine, Ser 1.89 (*)    Calcium 8.6 (*)    Albumin 3.0 (*)    GFR calc non Af Amer 27 (*)    GFR calc Af Amer 31 (*)    All other components within normal limits  CBC - Abnormal; Notable for the following components:   RBC 2.60 (*)    Hemoglobin 6.6 (*)    HCT 23.9 (*)    MCH 25.0 (*)    MCHC 27.2 (*)    RDW 18.8 (*)    All other components within normal limits  IRON AND TIBC - Abnormal; Notable for the following components:   Iron 21 (*)    TIBC 599 (*)    Saturation Ratios 4 (*)    All other components within normal limits  FERRITIN - Abnormal; Notable for the following components:   Ferritin 7 (*)    All other components within normal limits  RETICULOCYTES - Abnormal; Notable for the following components:   RBC. 2.52 (*)    All other components within normal limits  GLUCOSE, CAPILLARY - Abnormal; Notable for the following components:   Glucose-Capillary 168 (*)    All other components within normal limits  VITAMIN B12  FOLATE  PROTIME-INR  APTT  HIV ANTIBODY (ROUTINE TESTING W REFLEX)  CBC  BASIC METABOLIC PANEL  POC OCCULT BLOOD, ED  TYPE AND SCREEN  ABO/RH  PREPARE RBC (CROSSMATCH)    EKG EKG Interpretation  Date/Time:  Wednesday November 07 2017 17:07:57 EDT Ventricular Rate:  58 PR Interval:  208 QRS Duration: 86 QT Interval:  418 QTC Calculation: 410 R Axis:   79 Text Interpretation:  Sinus bradycardia Cannot rule out Anterior infarct , age undetermined Abnormal ECG Confirmed by Lennice Sites [phone]) on 11/07/2017 7:45:34 PM   Radiology Dg Chest 2 View  Result Date: 11/07/2017 CLINICAL DATA:  Dyspnea for 1 week EXAM: CHEST - 2 VIEW COMPARISON:  December 22, 2013 FINDINGS: The heart size and mediastinal contours are stable. The heart size is mildly enlarged. There is mild central vascular congestion. No frank pulmonary edema focal pneumonia or pleural effusion is identified. The visualized skeletal structures are unremarkable. IMPRESSION: Mild cardiomegaly with mild central pulmonary vascular congestion. Lungs are otherwise clear. Electronically Signed   By: Abelardo Diesel M.D.   On: 11/07/2017 13:51    Procedures .Critical Care Performed by: Volanda Napoleon, PA-C Authorized by: Volanda Napoleon, PA-C   Critical care provider statement:    Critical care time (minutes):  45   Critical care was necessary to treat or prevent imminent or life-threatening deterioration of the following conditions:  Circulatory failure and respiratory failure   Critical care was time spent personally by me on the following activities:  Discussions with consultants, evaluation of patient's response to treatment, examination of patient, ordering and performing treatments and interventions, ordering and review of laboratory studies, ordering and review  of radiographic studies, pulse oximetry, re-evaluation of patient's condition, obtaining history from patient or surrogate and review of old charts   (including critical care time)  Medications Ordered in ED Medications  sodium chloride flush (NS) 0.9 % injection 3 mL (3 mLs Intravenous Not Given 11/07/17 2354)  ondansetron (ZOFRAN) tablet 4 mg (has no administration in time range)    Or  ondansetron (ZOFRAN) injection 4 mg (has no administration in time range)  acetaminophen (TYLENOL) tablet 650 mg (has no administration in time range)    Or  acetaminophen (TYLENOL) suppository 650 mg (has no administration in time range)  0.9 %  sodium chloride infusion ( Intravenous Transfusing/Transfer 11/07/17 2221)  levalbuterol (XOPENEX) nebulizer solution 0.63 mg (has no administration in time range)  traMADol (ULTRAM) tablet 50 mg (has no administration in time range)  amLODipine (NORVASC) tablet 5 mg (has no administration in time range)  nicotine (NICODERM CQ - dosed in mg/24 hours) patch 21 mg (21 mg Transdermal Patch Applied 11/07/17 2350)  nebivolol (BYSTOLIC) tablet 2.5 mg (2.5 mg Oral Given 11/07/17 2349)  rosuvastatin (CRESTOR) tablet 40 mg (has no administration in time range)  pantoprazole (PROTONIX) injection 40 mg (40 mg Intravenous Given 11/07/17 2353)  FLUoxetine (PROZAC) capsule 20 mg (has no administration in time range)  arformoterol (BROVANA) nebulizer solution 15 mcg (has no administration in time range)  budesonide (PULMICORT) nebulizer solution 0.5 mg (has no administration in time range)  gabapentin (NEURONTIN) capsule 300 mg (300 mg Oral Given 11/07/17 2348)  fluticasone (FLONASE) 50 MCG/ACT nasal spray 1-2 spray (has no administration in time range)  cyclobenzaprine (FLEXERIL) tablet 5 mg (has no administration in time  range)  folic acid (FOLVITE) tablet 1 mg (has no administration in time range)  insulin aspart (novoLOG) injection 0-15 Units (has no administration in time  range)  insulin aspart (novoLOG) injection 0-5 Units (0 Units Subcutaneous Not Given 11/07/17 2348)  fenofibrate tablet 160 mg (has no administration in time range)  albuterol (PROVENTIL) (2.5 MG/3ML) 0.083% nebulizer solution 10 mg (10 mg Nebulization Given 11/07/17 2016)  insulin aspart (novoLOG) injection 10 Units (10 Units Intravenous Given 11/07/17 2015)    And  dextrose 50 % solution 50 mL (50 mLs Intravenous Given 11/07/17 2017)     Initial Impression / Assessment and Plan / ED Course  I have reviewed the triage vital signs and the nursing notes.  Pertinent labs & imaging results that were available during my care of the patient were reviewed by me and considered in my medical decision making (see chart for details).     67 y.o. F with PMH/o hypertension, obesity, OSA, diabetes presents for evaluation of low hemoglobin as well as shortness of breath and fatigue has been ongoing for 1 week.  Saw her PCP today for evaluation of shortness of breath and fatigue and was told her blood was low to go to the ED. patient reports recently diagnosed with an ulcer in June 2018.  One episode of anemia in July but states it resolved on its own.  She has not noticed any black or tarry stools.  No bruising noted.  No chest pain. Patient is afebrile, non-toxic appearing, sitting comfortably on examination table. Vital signs reviewed and stable.  GU exam reveals no gross melena.  Initial labs ordered at triage.  Fecal occult negative.  CMP shows hyperkalemia of 6.5, bicarb 21, glucose of 160, BUN of 40, creatinine of 1.89.  Her most recent lab work was approximately 5 years ago which showed a BUN of 21 and creatinine 1.02.  Review of her records show that she did see cardiology in October 2018 which showed a BUN of 20 and a creatinine of 1.14 at that time.  CBC shows hemoglobin of 6.6.  Hematocrit is 23.9.  Normal MCV.  Question of patient's shortness of breath and fatigue are symptomatic anemia.  Unclear of  acuity of acute kidney injury as no recent lab work done.  Will initiate hyperkalemia protocol and transfuse patient.  Additionally, given ulcer, this could be anemia from bleeding ulcer.  We will plan for admission.  Review of records show that in October 2018, her cardiologist, Dr. Claiborne Billings did bump up her spironolactone from 25 mg daily to 50 mg twice daily. Her creatinine at that time was 1.14.  Discussed patient with Dr. Tamala Julian (Hospitalist). Will accept patient for admission.   Final Clinical Impressions(s) / ED Diagnoses   Final diagnoses:  Symptomatic anemia  Hyperkalemia  Acute kidney injury [facility])    ED Discharge Orders    None       Volanda Napoleon, PA-C 11/08/17 0050    Lennice Sites, DO 11/08/17 0340

## 2017-11-07 NOTE — H&P (Signed)
History and Physical    Kristy Dougherty LDJ:[id number] DOB: Aug 09, 1950 DOA: 11/07/2017  Referring MD/NP/PA: Providence Lanius, PA-C PCP: [name], MD  Patient coming from: Home  Chief Complaint: Abnormal lab work  I have personally briefly reviewed patient's old medical records in [place]   HPI: Kristy Dougherty is a 67 y.o. female with medical history significant of HTN, HLD, DM type II, obesity, peptic ulcer, OSA CPAP, psoriatic arthritis, and obesity; who had been sent from her PCPs office for abnormal lab work.  Over the last week and a half the patient complained of generalized weakness and shortness of breath with exertion.  She reports finding herself more winded doing her daily activities than normal.  Patient had been being followed by Dr. [name] of [facility] and had endoscopy in July and was noted to have peptic ulcer at that time.  She was placed on Nexium and has been taking as prescribed.  Patient also reports that her blood counts were low at that time, but improved on repeat check and never required blood transfusion.  She denies being on blood thinners, but still reports some occasional use of ibuprofen for pain symptoms.  Other associated symptoms include complaints of shortness of breath with exertion, intermittent mostly nonoperative cough, leg swelling, some lightheadedness with changes in positions, left-sided abdominal pain since onset of symptoms.  She describes the abdominal pain is achy and crampy in nature but has only been mild.  Denies having any fever, nausea, vomiting, diarrhea, constipation, blood in stool/urine, or loss of consciousness.  She had gone to her rheumatologist this morning and received a steroid injection into her hip.  She then followed up this afternoon with her primary care provider where blood work was obtained and she was advised to come to the emergency department for further evaluation as her blood counts were noted to be  low.   ED Course: Upon admission into the emergency department patient was found to be afebrile, pulse 65-70, respirations 17-22, blood pressures 136/109-163/63, and O2 saturations maintained on room air.  Labs revealed WBC 10.1, hemoglobin 6.6, platelets 267, potassium 6.5, BUN 40, creatinine 1.89.  Stool guaiac was negative.  Chest x-ray showed mild cardiomegaly with pulmonary vascular congestion.  Patient was given albuterol breathing treatment along with 10 units of insulin and dextrose for hyperkalemia.  She was ordered to be transfused 2 units of packed red blood cells.  TRH called to admit.  Anemia panel requested prior to transfusion.  Review of Systems  Constitutional: Positive for malaise/fatigue. Negative for chills and fever.  HENT: Negative for nosebleeds and sore throat.   Eyes: Negative for pain and discharge.  Respiratory: Positive for cough and shortness of breath. Negative for sputum production.   Cardiovascular: Positive for leg swelling. Negative for chest pain.  Gastrointestinal: Negative for abdominal pain and blood in stool.  Genitourinary: Negative for dysuria and hematuria.  Musculoskeletal: Positive for joint pain. Negative for falls.  Skin: Positive for rash. Negative for itching.  Neurological: Negative for focal weakness and loss of consciousness.  Endo/Heme/Allergies: Negative for polydipsia.  Psychiatric/Behavioral: Negative for substance abuse. The patient is not nervous/anxious.     Past Medical History:  Diagnosis Date  . Diabetes mellitus without complication ([place])   . Hyperlipemia   . Hypertension   . Normal coronary arteries 2011  . Obesity   . OSA on CPAP   . Shingles July 2014    Past Surgical History:  Procedure Laterality Date  .  ABDOMINAL HYSTERECTOMY  1983  . CARDIAC CATHETERIZATION  04/02/2009   Normal coronary arteries  . US ECHOCARDIOGRAPHY  03/22/2009   EF =>55%,trace MR,mild TR & PI     reports that she has been smoking cigarettes.  She has a 40.00 pack-year smoking history. She has never used smokeless tobacco. She reports that she does not drink alcohol or use drugs.  No Known Allergies  Family History  Problem Relation Age of Onset  . Cancer Mother        lung  . Heart attack Father     Prior to Admission medications   Medication Sig Start Date End Date Taking? Authorizing Provider  Albiglutide (TANZEUM) 50 MG PEN Inject 50 mg into the skin once a week.    [provider]  amLODipine (NORVASC) 10 MG tablet Take 1 tablet (10 mg total) by mouth daily. NEED APPOINTMENT FOR FUTURE REFILLS 03/30/14   Troy Sine, MD  aspirin EC 81 MG tablet Take 81 mg by mouth daily.    [provider]  Choline Fenofibrate (TRILIPIX) 135 MG capsule Take 135 mg by mouth daily.    [provider]  cyclobenzaprine (FLEXERIL) 5 MG tablet Take 5 mg by mouth 3 (three) times daily as needed for muscle spasms.    [provider]  esomeprazole (NEXIUM) 40 MG capsule Take 40 mg by mouth daily before breakfast.    [provider]  FLUoxetine (PROZAC) 20 MG capsule Take 20 mg by mouth daily.    [provider]  folic acid (FOLVITE) 1 MG tablet Take 1 mg by mouth daily.    [provider]  gabapentin (NEURONTIN) 100 MG capsule Take 3 capsules by mouth 2 (two) times daily. 10/20/12   [provider]  Icosapent Ethyl 1 g CAPS Take 2 capsules by mouth 2 (two) times daily. 07/28/16   Troy Sine, MD  lisinopril-hydrochlorothiazide (PRINZIDE,ZESTORETIC) 20-12.5 MG per tablet Take 1 tablet by mouth 2 (two) times daily.     [provider]  metFORMIN (GLUCOPHAGE) 1000 MG tablet Take 1,000 mg by mouth 2 (two) times daily with a meal.    [provider]  methotrexate (RHEUMATREX) 2.5 MG tablet Take 12 mg by mouth once a week. Tuesdays.  Caution:Chemotherapy. Protect from light.    [provider]  nebivolol (BYSTOLIC) 5 MG tablet Take 2.5 mg by mouth daily.     [provider]  OZEMPIC 0.25 or 0.5 MG/DOSE [facility]  09/25/16   [provider]  rosuvastatin (CRESTOR) 40 MG tablet TAKE 1 TABLET BY MOUTH EVERY DAY 09/21/17   Troy Sine, MD  spironolactone (ALDACTONE) 25 MG tablet Take 1 tablet (25 mg total) by mouth 2 (two) times daily. Please call to make appointment for further refills. 08/20/17   Troy Sine, MD  traMADol (ULTRAM) 50 MG tablet Take 1 tablet by mouth as needed. 10/14/12   [provider]  TRESIBA FLEXTOUCH 200 UNIT/ML SOPN Inject 140 mLs as directed as directed. 02/01/16   [provider]    Physical Exam:  Constitutional: NAD, calm, comfortable Vitals:   11/07/17 1717 11/07/17 1720 11/07/17 1924 11/07/17 1926  BP:  (!) 163/63 (!) 136/109   Pulse:  65 70   Resp:  17 (!) 22   Temp:  98.1 F (36.7 C) 98.7 F (37.1 C)   TempSrc:  Oral Oral   SpO2:  93% 97% 100%  Weight: 112.9 kg     Height: 5\' 9"  (1.753  m)      Eyes: PERRL, lids and conjunctivae normal ENMT: Mucous membranes are moist. Posterior pharynx clear of any exudate or lesions. Normal dentition.  Neck: normal, supple, no masses, no thyromegaly Respiratory: clear to auscultation bilaterally, no wheezing, no crackles. Normal respiratory effort. No accessory muscle use.  Cardiovascular: Regular rate and rhythm, no murmurs / rubs / gallops. No extremity edema. 2+ pedal pulses. No carotid bruits.  Abdomen: no tenderness, no masses palpated. No hepatosplenomegaly. Bowel sounds positive.  Musculoskeletal: no clubbing / cyanosis. No joint deformity upper and lower extremities. Good ROM, no contractures. Normal muscle tone.  Skin: Psoriasis rash present. Neurologic: CN 2-12 grossly intact. Sensation intact, DTR normal. Strength 5/5 in all 4.  Psychiatric: Normal judgment and insight. Alert and oriented x 3. Normal mood.     Labs on Admission: I have personally reviewed following labs and imaging studies  CBC: Recent Labs  Lab  11/07/17 1723  WBC 10.1  HGB 6.6*  HCT 23.9*  MCV 91.9  PLT 751   Basic Metabolic Panel: Recent Labs  Lab 11/07/17 1723  NA 136  K 6.5*  CL 106  CO2 21*  GLUCOSE 160*  BUN 40*  CREATININE 1.89*  CALCIUM 8.6*   GFR: Estimated Creatinine Clearance: 39.8 mL/min (A) (by C-G formula based on SCr of 1.89 mg/dL (H)). Liver Function Tests: Recent Labs  Lab 11/07/17 1723  AST 26  ALT 12  ALKPHOS 73  BILITOT 0.4  PROT 6.6  ALBUMIN 3.0*   No results for input(s): LIPASE, AMYLASE in the last 168 hours. No results for input(s): AMMONIA in the last 168 hours. Coagulation Profile: No results for input(s): INR, PROTIME in the last 168 hours. Cardiac Enzymes: No results for input(s): CKTOTAL, CKMB, CKMBINDEX, TROPONINI in the last 168 hours. BNP (last 3 results) No results for input(s): PROBNP in the last 8760 hours. HbA1C: No results for input(s): HGBA1C in the last 72 hours. CBG: No results for input(s): GLUCAP in the last 168 hours. Lipid Profile: No results for input(s): CHOL, HDL, LDLCALC, TRIG, CHOLHDL, LDLDIRECT in the last 72 hours. Thyroid Function Tests: No results for input(s): TSH, T4TOTAL, FREET4, T3FREE, THYROIDAB in the last 72 hours. Anemia Panel: No results for input(s): VITAMINB12, FOLATE, FERRITIN, TIBC, IRON, RETICCTPCT in the last 72 hours. Urine analysis: No results found for: COLORURINE, APPEARANCEUR, LABSPEC, PHURINE, GLUCOSEU, HGBUR, BILIRUBINUR, KETONESUR, PROTEINUR, UROBILINOGEN, NITRITE, LEUKOCYTESUR Sepsis Labs: No results found for this or any previous visit (from the past 240 hour(s)).   Radiological Exams on Admission: Dg Chest 2 View  Result Date: 11/07/2017 CLINICAL DATA:  Dyspnea for 1 week EXAM: CHEST - 2 VIEW COMPARISON:  December 22, 2013 FINDINGS: The heart size and mediastinal contours are stable. The heart size is mildly enlarged. There is mild central vascular congestion. No frank pulmonary edema focal pneumonia or pleural effusion  is identified. The visualized skeletal structures are unremarkable. IMPRESSION: Mild cardiomegaly with mild central pulmonary vascular congestion. Lungs are otherwise clear. Electronically Signed   By: Abelardo Diesel M.D.   On: 11/07/2017 13:51    EKG: Independently reviewed.  Sinus bradycardia at 65 bpm with signs of T wave peaking.  Assessment/Plan Symptomatic anemia: Acute.  Patient with hemoglobin of 6.6 on admission, but negative stool guaiac.  Patient noted to have elevated BUN which also could suggest the possibility of upper GI bleed. - Admit to a telemetry bed - Clear Liquid diet and n.p.o. after midnight- Follow-up anemia panel - Add on PT/INR and  APTT - Continue with transfusion of 2 units of packed red blood cells - Recheck CBC after blood transfusion - Protonix IV - Hold aspirin   Peptic ulcer disease: Patient had been being followed by [facility] and had  endoscopy last in July started on a PPI at that time.  Patient reports still intermittent use of NSAIDs. - Notify [facility] in a.m.  Suspect Acute kidney injury: Initial creatinine noted to be 1.89 with BUN 40.  Last lab work from Dr. Evette Georges office noted creatinine to be 1.14 in 2/22/018.  Suspect prerenal cause of symptoms due to elevated BUN to creatinine ratio in setting of blood loss and diuretic use.   - Gentle IV fluids normal saline at 75 mL/h - Hold nephrotoxic agents - Recheck kidney function in a.m.  Hyperkalemia: Acute initial potassium noted to be elevated at 6.5 with signs of T wave peaking on EKG.  Patient is on medications of spironolactone prescribed by Dr. Claiborne Billings of cardiology that had recently been increased in February.  Patient was given insulin and glucose and albuterol breathing treatment while in the ED. - Normal saline IV fluids as seen above - Hold spironolactone - Repeat BMP  Diabetes mellitus type II: Patient presents with a blood glucose of 160 on admission. - Hypoglycemic protocol - Restart  home Lantus dose when diet restarted - CBGs q. before meals and at bedtime with moderate sliding scale insulin  COPD: Stable. - Change Symbicort to Brovana and budesonide nebs - Albuterol nebs as needed breath/wheezing - Mucinex  Essential hypertension - Hold lisinopril hydrochlorothiazide and spironolactone - Continue Bystolic  Anxiety/depression - Continue Prozac  Psoriatic arthritis: Patient on methotrexate and takes medication weekly on Tuesdays. - Continue methotrexate in outpatient setting  OSA on CPAP - CPAP per RT  Mixed hyperlipidemia - Continue fenofibric acid and Crestor  Tobacco Abuse: Patient reports smoking 1 pack cigarettes per day on average. - Nicotine patch - Counseled on the need of cessation of tobacco use  Obesity: BMI 36.77  DVT prophylaxis: SCDs Code Status: Full  Family Communication: Discussed plan of care with patient and family present at bedside Disposition Plan: Likely discharge home in 1 to 2 days Consults called: none Admission status: observation  Norval Morton MD [facility] Pager [phone]   If 7PM-7AM, please contact night-coverage [email or web address] Password [email or web address]  11/07/2017, 8:21 PM

## 2017-11-08 DIAGNOSIS — G4733 Obstructive sleep apnea (adult) (pediatric): Secondary | ICD-10-CM | POA: Diagnosis not present

## 2017-11-08 DIAGNOSIS — D649 Anemia, unspecified: Secondary | ICD-10-CM | POA: Diagnosis not present

## 2017-11-08 DIAGNOSIS — E119 Type 2 diabetes mellitus without complications: Secondary | ICD-10-CM | POA: Diagnosis not present

## 2017-11-08 DIAGNOSIS — N179 Acute kidney failure, unspecified: Secondary | ICD-10-CM | POA: Diagnosis present

## 2017-11-08 DIAGNOSIS — F17200 Nicotine dependence, unspecified, uncomplicated: Secondary | ICD-10-CM | POA: Diagnosis not present

## 2017-11-08 LAB — BASIC METABOLIC PANEL
ANION GAP: 9 (ref 5–15)
BUN: 33 mg/dL — AB (ref 8–23)
CO2: 23 mmol/L (ref 22–32)
Calcium: 8.9 mg/dL (ref 8.9–10.3)
Chloride: 105 mmol/L (ref 98–111)
Creatinine, Ser: 1.4 mg/dL — ABNORMAL HIGH (ref 0.44–1.00)
GFR calc Af Amer: 45 mL/min — ABNORMAL LOW (ref >60)
GFR, EST NON AFRICAN AMERICAN: 38 mL/min — AB (ref >60)
GLUCOSE: 122 mg/dL — AB (ref 70–99)
POTASSIUM: 5.4 mmol/L — AB (ref 3.5–5.1)
SODIUM: 137 mmol/L (ref 135–145)

## 2017-11-08 LAB — GLUCOSE, CAPILLARY
GLUCOSE-CAPILLARY: 131 mg/dL — AB (ref 70–99)
GLUCOSE-CAPILLARY: 157 mg/dL — AB (ref 70–99)
Glucose-Capillary: 104 mg/dL — ABNORMAL HIGH (ref 70–99)

## 2017-11-08 LAB — CBC
HCT: 28.1 % — ABNORMAL LOW (ref 36.0–46.0)
Hemoglobin: 8.2 g/dL — ABNORMAL LOW (ref 12.0–15.0)
MCH: 25.9 pg — AB (ref 26.0–34.0)
MCHC: 29.2 g/dL — ABNORMAL LOW (ref 30.0–36.0)
MCV: 88.9 fL (ref 80.0–100.0)
NRBC: 0 % (ref 0.0–0.2)
PLATELETS: 237 10*3/uL (ref 150–400)
RBC: 3.16 MIL/uL — AB (ref 3.87–5.11)
RDW: 17.8 % — AB (ref 11.5–15.5)
WBC: 10.3 10*3/uL (ref 4.0–10.5)

## 2017-11-08 LAB — HIV ANTIBODY (ROUTINE TESTING W REFLEX): HIV SCREEN 4TH GENERATION: NONREACTIVE

## 2017-11-08 MED ORDER — SODIUM CHLORIDE 0.9 % IV SOLN
510.0000 mg | Freq: Once | INTRAVENOUS | Status: AC
  Administered 2017-11-08: 510 mg via INTRAVENOUS
  Filled 2017-11-08: qty 17

## 2017-11-08 MED ORDER — GUAIFENESIN ER 600 MG PO TB12
600.0000 mg | ORAL_TABLET | Freq: Two times a day (BID) | ORAL | Status: DC
  Administered 2017-11-08: 600 mg via ORAL
  Filled 2017-11-08: qty 1

## 2017-11-08 MED ORDER — FUROSEMIDE 40 MG PO TABS: 40.0000 mg | ORAL_TABLET | Freq: Every day | ORAL | 0 refills | Status: DC

## 2017-11-08 MED ORDER — AMLODIPINE BESYLATE 10 MG PO TABS: 10.0000 mg | ORAL_TABLET | Freq: Every day | ORAL | Status: DC

## 2017-11-08 MED ORDER — ALBUTEROL SULFATE HFA 108 (90 BASE) MCG/ACT IN AERS: 2.0000 | INHALATION_SPRAY | Freq: Four times a day (QID) | RESPIRATORY_TRACT | 0 refills | Status: AC | PRN

## 2017-11-08 MED ORDER — NICOTINE 21 MG/24HR TD PT24: 21.0000 mg | MEDICATED_PATCH | Freq: Every day | TRANSDERMAL | 0 refills | Status: DC

## 2017-11-08 MED ORDER — AMLODIPINE BESYLATE 10 MG PO TABS: 10.0000 mg | ORAL_TABLET | Freq: Every day | ORAL | 0 refills | Status: DC

## 2017-11-08 NOTE — Care Management Note (Signed)
Case Management Note  Patient Details  Name: CHIANNE BYRNS MRN: [id number] Date of Birth: August 17, 1950  Subjective/Objective:                 obs for symptomatic anemia   Action/Plan:  PAtient form home, independent PTA. Receiving transfusions. Following K+. No apparent home needs at this time. CM available for dispo needs.   Expected Discharge Date:                  Expected Discharge Plan:     In-House Referral:     Discharge planning Services  CM Consult  Post Acute Care Choice:    Choice offered to:     DME Arranged:    DME Agency:     HH Arranged:    HH Agency:     Status of Service:  In process, will continue to follow  If discussed at Long Length of Stay Meetings, dates discussed:    Additional Comments:  Carles Collet, RN 11/08/2017, 10:21 AM

## 2017-11-08 NOTE — Progress Notes (Signed)
SATURATION QUALIFICATIONS:  Patient Saturations on Room Air at Rest = 96%  Patient Saturations on Room Air while Ambulating = 94%  Patient ambulated 50 feet with no signs and symptoms of distress. Dorthey Sawyer, RN

## 2017-11-08 NOTE — Discharge Instructions (Signed)
If you notice worsening SOB or dark/tarry stools need to return to the ER Stop smoking NO NSAIDS/IBUPROFEN

## 2017-11-08 NOTE — Discharge Summary (Signed)
Physician Discharge Summary  Kristy Dougherty JOI:[id number] DOB: Oct 11, 1950 DOA: 11/07/2017  PCP: [name], MD  Admit date: 11/07/2017 Discharge date: 11/08/2017  Admitted From: home Discharge disposition: home   Recommendations for Outpatient Follow-Up:   1. Cbc, bmp 1 week 2. Close follow up with Dr. [name] regarding ulcer-- heme negative here 3. Close follow up with Dr. Georgina Peer-- adjusting of BP medications   Discharge Diagnosis:   Principal Problem:   Symptomatic anemia Active Problems:   Diabetes mellitus ([place])   Dyslipidemia   Obesity   Sleep apnea- on C-pap   Smoker   AKI (acute kidney injury) ([place])    Discharge Condition: Improved.  Diet recommendation: Low sodium, heart healthy.  Carbohydrate-modified.  Wound care: None.  Code status: Full.   History of Present Illness:   Kristy Dougherty is a 67 y.o. female with medical history significant of HTN, HLD, DM type II, obesity, peptic ulcer, OSA CPAP, psoriatic arthritis, and obesity; who had been sent from her PCPs office for abnormal lab work.  Over the last week and a half the patient complained of generalized weakness and shortness of breath with exertion.  She reports finding herself more winded doing her daily activities than normal.  Patient had been being followed by Dr. [name] of [place] and had endoscopy in July and was noted to have peptic ulcer at that time.  She was placed on Nexium and has been taking as prescribed.  Patient also reports that her blood counts were low at that time, but improved on repeat check and never required blood transfusion.  She denies being on blood thinners, but still reports some occasional use of ibuprofen for pain symptoms.  Other associated symptoms include complaints of shortness of breath with exertion, intermittent mostly nonoperative cough, leg swelling, some lightheadedness with changes in positions, left-sided abdominal pain since onset of  symptoms.  She describes the abdominal pain is achy and crampy in nature but has only been mild.  Denies having any fever, nausea, vomiting, diarrhea, constipation, blood in stool/urine, or loss of consciousness.  She had gone to her rheumatologist this morning and received a steroid injection into her hip.  She then followed up this afternoon with her primary care provider where blood work was obtained and she was advised to come to the emergency department for further evaluation as her blood counts were noted to be low.   Hospital Course by Problem:   Symptomatic anemia: Acute.  Patient with hemoglobin of 6.6 on admission, but negative stool guaiac.   - s/p transfusion of 2 units of packed red blood cells and IV Fe - outpatient GI follow up =patient feeling much better and prefers to have outpatient follow up-- missed last appointment with both cardiology as well as GI-- she has called and made appointments for both   Peptic ulcer disease: Patient had been being followed by [facility] and had  endoscopy last in July started on a PPI at that time.  Patient reports still intermittent use of NSAIDs. - outpatient follow up== heme negative and denies dark stools -stop NSAIDS  AKI on CKD stage 3 -follow outpatient -has seen Dr. [name] in the past  Hyperkalemia:  -improved -holding spironolactone for now -lasix x 2 days- recheck BMP and resume spiro as able  Diabetes mellitus type II:  -resume home meds  COPD: Stable. - resume home meds  Essential hypertension - have adjusted medications due to hyperkalemia -will probably need further  adjustments-- per Dr. Evette Georges note he was considering hydralazine  Anxiety/depression - Continue Prozac  Psoriatic arthritis: Patient on methotrexate and takes medication weekly on Tuesdays. - Continue methotrexate in outpatient setting  OSA on CPAP - CPAP   Mixed hyperlipidemia - Continue fenofibric acid and Crestor  Tobacco Abuse: Patient  reports smoking 1 pack cigarettes per day on average. - Nicotine patch - Counseled on the need of cessation of tobacco use  Obesity: BMI 36.77    Medical Consultants:      Discharge Exam:   Vitals:   11/08/17 0415 11/08/17 0720  BP: (!) 153/89 (!) 150/100  Pulse: (!) 59 (!) 53  Resp: 18 18  Temp: 98.2 F (36.8 C) 98.2 F (36.8 C)  SpO2: 93% 95%   Vitals:   11/08/17 0041 11/08/17 0127 11/08/17 0415 11/08/17 0720  BP: (!) 162/56 (!) 162/48 (!) 153/89 (!) 150/100  Pulse: 68 66 (!) 59 (!) 53  Resp: 20 20 18 18   Temp: 98 F (36.7 C) 98 F (36.7 C) 98.2 F (36.8 C) 98.2 F (36.8 C)  TempSrc: Oral Oral Oral Oral  SpO2: 95% 96% 93% 95%  Weight:      Height:        General exam: Appears calm and comfortable. Walking and feeling better    The results of significant diagnostics from this hospitalization (including imaging, microbiology, ancillary and laboratory) are listed below for reference.     Procedures and Diagnostic Studies:   Dg Chest 2 View  Result Date: 11/07/2017 CLINICAL DATA:  Dyspnea for 1 week EXAM: CHEST - 2 VIEW COMPARISON:  December 22, 2013 FINDINGS: The heart size and mediastinal contours are stable. The heart size is mildly enlarged. There is mild central vascular congestion. No frank pulmonary edema focal pneumonia or pleural effusion is identified. The visualized skeletal structures are unremarkable. IMPRESSION: Mild cardiomegaly with mild central pulmonary vascular congestion. Lungs are otherwise clear. Electronically Signed   By: Abelardo Diesel M.D.   On: 11/07/2017 13:51     Labs:   Basic Metabolic Panel: Recent Labs  Lab 11/07/17 1723 11/08/17 0525  NA 136 137  K 6.5* 5.4*  CL 106 105  CO2 21* 23  GLUCOSE 160* 122*  BUN 40* 33*  CREATININE 1.89* 1.40*  CALCIUM 8.6* 8.9   GFR Estimated Creatinine Clearance: 53.7 mL/min (A) (by C-G formula based on SCr of 1.4 mg/dL (H)). Liver Function Tests: Recent Labs  Lab 11/07/17 1723    AST 26  ALT 12  ALKPHOS 73  BILITOT 0.4  PROT 6.6  ALBUMIN 3.0*   No results for input(s): LIPASE, AMYLASE in the last 168 hours. No results for input(s): AMMONIA in the last 168 hours. Coagulation profile Recent Labs  Lab 11/07/17 2029  INR 1.18    CBC: Recent Labs  Lab 11/07/17 1723 11/08/17 0525  WBC 10.1 10.3  HGB 6.6* 8.2*  HCT 23.9* 28.1*  MCV 91.9 88.9  PLT 267 237   Cardiac Enzymes: No results for input(s): CKTOTAL, CKMB, CKMBINDEX, TROPONINI in the last 168 hours. BNP: Invalid input(s): POCBNP CBG: Recent Labs  Lab 11/07/17 2332 11/08/17 0415 11/08/17 0719 11/08/17 1121  GLUCAP 168* 131* 104* 157*   D-Dimer No results for input(s): DDIMER in the last 72 hours. Hgb A1c No results for input(s): HGBA1C in the last 72 hours. Lipid Profile No results for input(s): CHOL, HDL, LDLCALC, TRIG, CHOLHDL, LDLDIRECT in the last 72 hours. Thyroid function studies No results for input(s): TSH, T4TOTAL, T3FREE,  THYROIDAB in the last 72 hours.  Invalid input(s): FREET3 Anemia work up [employer]    11/07/17 2029  VITAMINB12 305  FOLATE 29.0  FERRITIN 7*  TIBC 599*  IRON 21*  RETICCTPCT 3.1   Microbiology No results found for this or any previous visit (from the past 240 hour(s)).   Discharge Instructions:   Discharge Instructions    Diet - low sodium heart healthy   Complete by:  As directed    Diet Carb Modified   Complete by:  As directed    Discharge instructions   Complete by:  As directed    Need close follow up with Dr. Georgina Peer for adjustments of your BP medication ( I am holding your spironolactone/aldactone as your potassium was high when you came in and will replace with lasix for a few doses)-- you will need BMP/CBC next week Needs to follow up with Dr. [name]-- NO NSAIDS/IBUPROFEN STOP SMOKING Be sure to be compliant with your CPAP! Bring log of blood sugars to PCP for medication adjustment   Increase activity slowly   Complete by:   As directed      Allergies as of 11/08/2017   No Known Allergies     Medication List    STOP taking these medications   aspirin EC 81 MG tablet   spironolactone 25 MG tablet Commonly known as:  ALDACTONE     TAKE these medications   acetaminophen 500 MG tablet Commonly known as:  TYLENOL Take 500-1,000 mg by mouth every 6 (six) hours as needed (for pain).   albuterol 108 (90 Base) MCG/ACT inhaler Commonly known as:  PROVENTIL HFA;VENTOLIN HFA Inhale 2 puffs into the lungs every 6 (six) hours as needed for wheezing or shortness of breath.   amLODipine 10 MG tablet Commonly known as:  NORVASC Take 1 tablet (10 mg total) by mouth daily. Start taking on:  11/09/2017 What changed:  additional instructions   budesonide-formoterol 160-4.5 MCG/ACT inhaler Commonly known as:  SYMBICORT Inhale 2 puffs into the lungs 2 (two) times daily.   cyclobenzaprine 5 MG tablet Commonly known as:  FLEXERIL Take 5 mg by mouth 3 (three) times daily as needed for muscle spasms.   esomeprazole 40 MG capsule Commonly known as:  NEXIUM Take 40 mg by mouth daily before breakfast.   Fenofibric Acid 135 MG Cpdr Take 135 mg by mouth daily.   fluticasone 50 MCG/ACT nasal spray Commonly known as:  FLONASE Place 1-2 sprays into both nostrils daily as needed for allergies or rhinitis.   folic acid 1 MG tablet Commonly known as:  FOLVITE Take 1 mg by mouth daily.   furosemide 40 MG tablet Commonly known as:  LASIX Take 1 tablet (40 mg total) by mouth daily for 2 days.   gabapentin 100 MG capsule Commonly known as:  NEURONTIN Take 300 mg by mouth at bedtime.   Icosapent Ethyl 1 g Caps Take 2 capsules by mouth 2 (two) times daily.   lisinopril-hydrochlorothiazide 20-12.5 MG tablet Commonly known as:  PRINZIDE,ZESTORETIC Take 1 tablet by mouth 2 (two) times daily.   metFORMIN 1000 MG tablet Commonly known as:  GLUCOPHAGE Take 1,000 mg by mouth 2 (two) times daily with a meal.     methotrexate 2.5 MG tablet Commonly known as:  RHEUMATREX Take 15 mg by mouth every Tuesday. Caution:Chemotherapy. Protect from light.   nebivolol 5 MG tablet Commonly known as:  BYSTOLIC Take 2.5 mg by mouth at bedtime.   nicotine 21 mg/24hr patch  Commonly known as:  NICODERM CQ - dosed in mg/24 hours Place 1 patch (21 mg total) onto the skin daily. Start taking on:  11/09/2017   PROZAC 20 MG capsule Generic drug:  FLUoxetine Take 20 mg by mouth daily.   rosuvastatin 40 MG tablet Commonly known as:  CRESTOR TAKE 1 TABLET BY MOUTH EVERY DAY   traMADol 50 MG tablet Commonly known as:  ULTRAM Take 50 mg by mouth every 6 (six) hours as needed (for pain).   TRESIBA FLEXTOUCH 200 UNIT/ML Sopn Generic drug:  Insulin Degludec Inject 125 Units into the skin daily before breakfast.      Follow-up Information    [name], MD Follow up in 1 week(s).   Specialty:  Family Medicine Why:  need repeat CBC/BMP Contact information: [address] [place] [address] [phone]        Troy Sine, MD. Schedule an appointment as soon as possible for a visit.   Specialty:  Cardiology Contact information: [address] [place] [address] [phone]        Laurence Spates, MD. Schedule an appointment as soon as possible for a visit.   Specialty:  Gastroenterology Contact information: [address] [place] [address] [phone]            Time coordinating discharge: 25 min  Signed:  Geradine Girt  [facility] 11/08/2017, 1:42 PM

## 2017-11-08 NOTE — Progress Notes (Signed)
Patient discharged to home. After visit summary reviewed and patient capable of re verbalizing medications and follow up appointments. Pt remains stable. No signs and symptoms of distress. Educated to return to ER in the event of SOB, dizziness, chest pain, or fainting. Ryenn Howeth, RN  

## 2017-11-09 LAB — BPAM RBC
BLOOD PRODUCT EXPIRATION DATE: 201910312359
Blood Product Expiration Date: 201910312359
ISSUE DATE / TIME: 201910100052
ISSUE DATE / TIME: 201910092124
Unit Type and Rh: 7300
Unit Type and Rh: 7300

## 2017-11-09 LAB — TYPE AND SCREEN
ABO/RH(D): B POS
ANTIBODY SCREEN: NEGATIVE
Unit division: 0
Unit division: 0

## 2017-11-12 ENCOUNTER — Encounter: Payer: Self-pay | Admitting: Physician Assistant

## 2017-11-12 ENCOUNTER — Ambulatory Visit: Payer: BLUE CROSS/BLUE SHIELD | Admitting: Physician Assistant

## 2017-11-12 VITALS — BP 160/82 | HR 69 | Ht 69.0 in | Wt 234.0 lb

## 2017-11-12 DIAGNOSIS — Z79899 Other long term (current) drug therapy: Secondary | ICD-10-CM

## 2017-11-12 DIAGNOSIS — G4733 Obstructive sleep apnea (adult) (pediatric): Secondary | ICD-10-CM

## 2017-11-12 DIAGNOSIS — E119 Type 2 diabetes mellitus without complications: Secondary | ICD-10-CM

## 2017-11-12 DIAGNOSIS — Z9989 Dependence on other enabling machines and devices: Secondary | ICD-10-CM

## 2017-11-12 DIAGNOSIS — Z720 Tobacco use: Secondary | ICD-10-CM

## 2017-11-12 DIAGNOSIS — Z794 Long term (current) use of insulin: Secondary | ICD-10-CM

## 2017-11-12 DIAGNOSIS — I10 Essential (primary) hypertension: Secondary | ICD-10-CM

## 2017-11-12 DIAGNOSIS — E785 Hyperlipidemia, unspecified: Secondary | ICD-10-CM

## 2017-11-12 DIAGNOSIS — D500 Iron deficiency anemia secondary to blood loss (chronic): Secondary | ICD-10-CM

## 2017-11-12 MED ORDER — FUROSEMIDE 40 MG PO TABS: 40.0000 mg | ORAL_TABLET | ORAL | 3 refills | Status: DC | PRN

## 2017-11-12 MED ORDER — NEBIVOLOL HCL 5 MG PO TABS: 5.0000 mg | ORAL_TABLET | Freq: Every day | ORAL | 3 refills | Status: DC

## 2017-11-12 MED ORDER — SPIRONOLACTONE 25 MG PO TABS: 25.0000 mg | ORAL_TABLET | Freq: Every day | ORAL | 3 refills | Status: DC

## 2017-11-12 NOTE — Patient Instructions (Signed)
Medication Instructions:  Your physician has recommended you make the following change in your medication:  1.  RESTART the Spironolactone 25 mg daily 2.  INCREASE the Bystolic to whole tablet = 5 mg daily 3.  ONLY take there Lasix to ONLY AS NEEDED   If you need a refill on your cardiac medications before your next appointment, please call your pharmacy.   Lab work: THIS Friday, 11/16/17:  BMET  If you have labs (blood work) drawn today and your tests are completely normal, you will receive your results only by: Marland Kitchen MyChart Message (if you have MyChart) OR . A paper copy in the mail If you have any lab test that is abnormal or we need to change your treatment, we will call you to review the results.  Testing/Procedures: None ordered  Follow-Up: At [facility], you and your health needs are our priority.  As part of our continuing mission to provide you with exceptional heart care, we have created designated Provider Care Teams.  These Care Teams include your primary Cardiologist (physician) and Advanced Practice Providers (APPs -  Physician Assistants and Nurse Practitioners) who all work together to provide you with the care you need, when you need it. 2-3 WEEKS WITH HAO MENG, PA-C 4-6 WEEKS WITH DR. Claiborne Billings   Any Other Special Instructions Will Be Listed Below (If Applicable).

## 2017-11-12 NOTE — Progress Notes (Signed)
Cardiology Office Note    Date:  11/12/2017   ID:  Kristy Dougherty, DOB 23-Jun-1950, MRN [id number]  PCP:  Kristy Neer, MD  Cardiologist:  Dr. Claiborne Dougherty  Chief Complaint  Patient presents with  . Follow-up    seen for Dr. Claiborne Dougherty.     History of Present Illness:  Kristy Dougherty is a 67 y.o. female with PMH of OSA on CPAP, tobacco abuse, HTN, HLD, DM II, and obesity.  She had a cardiac catheterization in 2011 which showed normal coronaries.  She was found to have severe obstructive sleep apnea in the same year.  Her Bystolic was reduced in 6283 due to bradycardia.  Patient has been complaining of generalized weakness and shortness of breath with exertion.  She was admitted to the hospital on 11/07/2017 with symptomatic anemia with hemoglobin of 6.6.  Potassium level was 6.5 on arrival.  Creatinine was elevated at 1.89.  She was transfused with 2 units of packed red blood cell.  She was not on any blood thinners at the time.  Symptoms improved after blood transfusion.  Since she has a history of peptic ulcer disease.  She was referred to follow-up with fecal GI as outpatient.  She was instructed to hold any NSAIDs.  Patient presents today for cardiology office visit.  She has upcoming EGD by colonoscopy.  She denies any chest pain or shortness of breath.  Her blood pressure has been elevated.  I will increase Bystolic to 5 mg daily and restart spironolactone at 25 mg daily.  I will monitor her renal function and electrolytes closely by repeating a basic metabolic panel.  Since discharge, she has been taking Lasix on a daily basis.  I will change Lasix to as needed.  She will monitor for any increased lower extremity edema.  I will see her in 2 to 3 weeks for titration of blood pressure medication.  Past Medical History:  Diagnosis Date  . Diabetes mellitus without complication ([place])   . Hyperlipemia   . Hypertension   . Normal coronary arteries 2011  . Obesity   . OSA on CPAP   .  Shingles July 2014    Past Surgical History:  Procedure Laterality Date  . ABDOMINAL HYSTERECTOMY  1983  . CARDIAC CATHETERIZATION  04/02/2009   Normal coronary arteries  . US ECHOCARDIOGRAPHY  03/22/2009   EF =>55%,trace MR,mild TR & PI    Current Medications: Outpatient Medications Prior to Visit  Medication Sig Dispense Refill  . acetaminophen (TYLENOL) 500 MG tablet Take 500-1,000 mg by mouth every 6 (six) hours as needed (for pain).    Marland Kitchen albuterol (PROVENTIL HFA) 108 (90 Base) MCG/ACT inhaler Inhale 2 puffs into the lungs every 6 (six) hours as needed for wheezing or shortness of breath. 1 Inhaler 0  . amLODipine (NORVASC) 10 MG tablet Take 1 tablet (10 mg total) by mouth daily. 30 tablet 0  . budesonide-formoterol (SYMBICORT) 160-4.5 MCG/ACT inhaler Inhale 2 puffs into the lungs 2 (two) times daily.    . Choline Fenofibrate (FENOFIBRIC ACID) 135 MG CPDR Take 135 mg by mouth daily.    . cyclobenzaprine (FLEXERIL) 5 MG tablet Take 5 mg by mouth 3 (three) times daily as needed for muscle spasms.    Marland Kitchen esomeprazole (NEXIUM) 40 MG capsule Take 40 mg by mouth daily before breakfast.    . FLUoxetine (PROZAC) 20 MG capsule Take 20 mg by mouth daily.    . fluticasone (FLONASE) 50 MCG/ACT nasal spray Place  1-2 sprays into both nostrils daily as needed for allergies or rhinitis.    . folic acid (FOLVITE) 1 MG tablet Take 1 mg by mouth daily.    Marland Kitchen gabapentin (NEURONTIN) 100 MG capsule Take 300 mg by mouth at bedtime.     Kristy Dougherty Kristy Dougherty 1 g CAPS Take 2 capsules by mouth 2 (two) times daily. 360 capsule 3  . lisinopril-hydrochlorothiazide (PRINZIDE,ZESTORETIC) 20-12.5 MG per tablet Take 1 tablet by mouth 2 (two) times daily.     . metFORMIN (GLUCOPHAGE) 1000 MG tablet Take 1,000 mg by mouth 2 (two) times daily with a meal.    . methotrexate (RHEUMATREX) 2.5 MG tablet Take 15 mg by mouth every Tuesday. Caution:Chemotherapy. Protect from light.    . nicotine (NICODERM CQ - DOSED IN MG/24 HOURS) 21  mg/24hr patch Place 1 patch (21 mg total) onto the skin daily. 28 patch 0  . rosuvastatin (CRESTOR) 40 MG tablet TAKE 1 TABLET BY MOUTH EVERY DAY (Patient taking differently: Take 40 mg by mouth daily. ) 90 tablet 3  . traMADol (ULTRAM) 50 MG tablet Take 50 mg by mouth every 6 (six) hours as needed (for pain).     . TRESIBA FLEXTOUCH 200 UNIT/ML SOPN Inject 125 Units into the skin daily before breakfast.     . nebivolol (BYSTOLIC) 5 MG tablet Take 2.5 mg by mouth at bedtime.     . furosemide (LASIX) 40 MG tablet Take 1 tablet (40 mg total) by mouth daily for 2 days. 2 tablet 0   No facility-administered medications prior to visit.      Allergies:   Patient has no known allergies.   Social History   Socioeconomic History  . Marital status: Widowed    Spouse name: Not on file  . Number of children: Not on file  . Years of education: Not on file  . Highest education level: Not on file  Occupational History  . Not on file  Social Needs  . Financial resource strain: Not on file  . Food insecurity:    Worry: Not on file    Inability: Not on file  . Transportation needs:    Medical: Not on file    Non-medical: Not on file  Tobacco Use  . Smoking status: Current Every Day Smoker    Packs/day: 1.00    Years: 40.00    Pack years: 40.00    Types: Cigarettes  . Smokeless tobacco: Never Used  Substance and Sexual Activity  . Alcohol use: No  . Drug use: No  . Sexual activity: Not on file  Lifestyle  . Physical activity:    Days per week: Not on file    Minutes per session: Not on file  . Stress: Not on file  Relationships  . Social connections:    Talks on phone: Not on file    Gets together: Not on file    Attends religious service: Not on file    Active member of club or organization: Not on file    Attends meetings of clubs or organizations: Not on file    Relationship status: Not on file  Other Topics Concern  . Not on file  Social History Narrative  . Not on file       Family History:  The patient's family history includes Cancer in her mother; Heart attack in her father.   ROS:   Please see the history of present illness.    ROS All other systems reviewed and are  negative.   PHYSICAL EXAM:   VS:  BP (!) 160/82 (BP Location: Right Arm, Patient Position: Sitting, Cuff Size: Normal)   Pulse 69   Ht 5\' 9"  (1.753 m)   Wt 234 lb (106.1 kg)   SpO2 96%   BMI 34.56 kg/m    GEN: Well nourished, well developed, in no acute distress  HEENT: normal  Neck: no JVD, carotid bruits, or masses Cardiac: RRR; no murmurs, rubs, or gallops,no edema  Respiratory:  clear to auscultation bilaterally, normal work of breathing GI: soft, nontender, nondistended, + BS MS: no deformity or atrophy  Skin: warm and dry, no rash Neuro:  Alert and Oriented x 3, Strength and sensation are intact Psych: euthymic mood, full affect  Wt Readings from Last 3 Encounters:  11/12/17 234 lb (106.1 kg)  11/07/17 249 lb (112.9 kg)  11/21/16 249 lb (112.9 kg)      Studies/Labs Reviewed:   EKG:  EKG is not ordered today.    Recent Labs: 11/07/2017: ALT 12 11/08/2017: BUN 33; Creatinine, Ser 1.40; Hemoglobin 8.2; Platelets 237; Potassium 5.4; Sodium 137   Lipid Panel No results found for: CHOL, TRIG, HDL, CHOLHDL, VLDL, LDLCALC, LDLDIRECT  Additional studies/ records that were reviewed today include:   Echo 03/13/2016 LV EF: 60% -   65% Study Conclusions  - Left ventricle: The cavity size was normal. Wall thickness was   increased in a pattern of moderate LVH. Systolic function was   normal. The estimated ejection fraction was in the range of 60%   to 65%. Left ventricular diastolic function parameters were   normal. - Mitral valve: There was mild regurgitation. - Pulmonary arteries: PA peak pressure: 41 mm Hg (S).    ASSESSMENT:    1. OSA on CPAP   2. Essential hypertension   3. Medication management   4. Tobacco abuse   5. Hyperlipidemia, unspecified  hyperlipidemia type   6. Controlled type 2 diabetes mellitus without complication, with long-term current use of insulin ([place])   7. Iron deficiency anemia due to chronic blood loss      PLAN:  In order of problems listed above:  1. OSA on CPAP: Follow-up with Dr. Claiborne Dougherty for management of CPAP therapy  2. Anemia: Follow-up with GI for EGD.  Recently admitted for symptomatic anemia  3. Hypertension: Blood pressure elevated, spironolactone was recently discontinued when the patient presented to the hospital with hyperkalemia and severe anemia.  We will do another trial with spironolactone at a lower dose.  Restart spironolactone 25 mg daily.  Increase Bystolic to 5 mg daily  4. Hyperlipidemia: On 40 mg daily of Crestor.  5. DM2: On metformin and insulin, managed by primary care provider    Medication Adjustments/Labs and Tests Ordered: Current medicines are reviewed at length with the patient today.  Concerns regarding medicines are outlined above.  Medication changes, Labs and Tests ordered today are listed in the Patient Instructions below. Patient Instructions  Medication Instructions:  Your physician has recommended you make the following change in your medication:  1.  RESTART the Spironolactone 25 mg daily 2.  INCREASE the Bystolic to whole tablet = 5 mg daily 3.  ONLY take there Lasix to ONLY AS NEEDED   If you need a refill on your cardiac medications before your next appointment, please call your pharmacy.   Lab work: THIS Friday, 11/16/17:  BMET  If you have labs (blood work) drawn today and your tests are completely normal, you will receive your  results only by: Marland Kitchen MyChart Message (if you have MyChart) OR . A paper copy in the mail If you have any lab test that is abnormal or we need to change your treatment, we will call you to review the results.  Testing/Procedures: None ordered  Follow-Up: At [facility], you and your health needs are our priority.  As part  of our continuing mission to provide you with exceptional heart care, we have created designated Provider Care Teams.  These Care Teams include your primary Cardiologist (physician) and Advanced Practice Providers (APPs -  Physician Assistants and Nurse Practitioners) who all work together to provide you with the care you need, when you need it. 2-3 WEEKS WITH Kristy Ridener, PA-C 4-6 WEEKS WITH DR. Claiborne Dougherty   Any Other Special Instructions Will Be Listed Below (If Applicable).       Hilbert Corrigan, [place]  11/12/2017 4:47 PM    [facility] [address], [place], [place]  [address] Phone: [phone]; Fax: [phone]

## 2017-11-14 ENCOUNTER — Other Ambulatory Visit: Payer: Self-pay | Admitting: Cardiovascular Disease

## 2017-11-16 ENCOUNTER — Other Ambulatory Visit: Payer: Self-pay

## 2017-11-16 DIAGNOSIS — Z79899 Other long term (current) drug therapy: Secondary | ICD-10-CM

## 2017-11-16 DIAGNOSIS — I10 Essential (primary) hypertension: Secondary | ICD-10-CM

## 2017-11-16 LAB — BASIC METABOLIC PANEL
BUN/Creatinine Ratio: 26 (ref 12–28)
BUN: 35 mg/dL — AB (ref 8–27)
CALCIUM: 9 mg/dL (ref 8.7–10.3)
CO2: 22 mmol/L (ref 20–29)
CREATININE: 1.36 mg/dL — AB (ref 0.57–1.00)
Chloride: 99 mmol/L (ref 96–106)
GFR, EST AFRICAN AMERICAN: 47 mL/min/{1.73_m2} — AB (ref >59)
GFR, EST NON AFRICAN AMERICAN: 41 mL/min/{1.73_m2} — AB (ref >59)
Glucose: 84 mg/dL (ref 65–99)
Potassium: 5.3 mmol/L — ABNORMAL HIGH (ref 3.5–5.2)
Sodium: 135 mmol/L (ref 134–144)

## 2017-11-20 ENCOUNTER — Other Ambulatory Visit: Payer: Self-pay

## 2017-11-20 DIAGNOSIS — N179 Acute kidney failure, unspecified: Secondary | ICD-10-CM

## 2017-11-23 ENCOUNTER — Telehealth: Payer: Self-pay | Admitting: Cardiovascular Disease

## 2017-11-23 NOTE — Telephone Encounter (Signed)
° ° °  Dr [name] office calling to inform Dr Claiborne Billings, patient was scheduled for procedure today but her HR was 37. Patient has appt scheduled with [facility] 10/28

## 2017-11-23 NOTE — Telephone Encounter (Signed)
Spoke with Nurse from [facility] who states pt was scheduled for an upper endoscopy today but had to reschedule as pt HR was 37 and irregular. She reports pt states she woke up feeling bad and still SOB.  Attempted to contact pt to schedule for her to be seen in office today. Left message for pt to call back.

## 2017-11-26 ENCOUNTER — Ambulatory Visit: Payer: BLUE CROSS/BLUE SHIELD | Admitting: Physician Assistant

## 2017-11-26 ENCOUNTER — Encounter: Payer: Self-pay | Admitting: Physician Assistant

## 2017-11-26 ENCOUNTER — Other Ambulatory Visit: Payer: Self-pay

## 2017-11-26 VITALS — BP 150/54 | HR 57 | Ht 69.0 in | Wt 235.2 lb

## 2017-11-26 DIAGNOSIS — E119 Type 2 diabetes mellitus without complications: Secondary | ICD-10-CM

## 2017-11-26 DIAGNOSIS — D649 Anemia, unspecified: Secondary | ICD-10-CM | POA: Diagnosis not present

## 2017-11-26 DIAGNOSIS — Z794 Long term (current) use of insulin: Secondary | ICD-10-CM

## 2017-11-26 DIAGNOSIS — E785 Hyperlipidemia, unspecified: Secondary | ICD-10-CM

## 2017-11-26 DIAGNOSIS — E875 Hyperkalemia: Secondary | ICD-10-CM | POA: Diagnosis not present

## 2017-11-26 DIAGNOSIS — N179 Acute kidney failure, unspecified: Secondary | ICD-10-CM

## 2017-11-26 DIAGNOSIS — Z9989 Dependence on other enabling machines and devices: Secondary | ICD-10-CM

## 2017-11-26 DIAGNOSIS — I10 Essential (primary) hypertension: Secondary | ICD-10-CM | POA: Diagnosis not present

## 2017-11-26 DIAGNOSIS — G4733 Obstructive sleep apnea (adult) (pediatric): Secondary | ICD-10-CM

## 2017-11-26 MED ORDER — NEBIVOLOL HCL 2.5 MG PO TABS: 2.5000 mg | ORAL_TABLET | Freq: Every day | ORAL | 1 refills | Status: DC

## 2017-11-26 MED ORDER — HYDRALAZINE HCL 25 MG PO TABS: 25.0000 mg | ORAL_TABLET | Freq: Two times a day (BID) | ORAL | 1 refills | Status: DC

## 2017-11-26 MED ORDER — SPIRONOLACTONE 25 MG PO TABS: 12.5000 mg | ORAL_TABLET | Freq: Every day | ORAL | 3 refills | Status: DC

## 2017-11-26 NOTE — Telephone Encounter (Signed)
patient has an appointment 11/26/17

## 2017-11-26 NOTE — Telephone Encounter (Signed)
Will need f/u office eval

## 2017-11-26 NOTE — Addendum Note (Signed)
Addended by: Therisa Doyne on: 11/26/2017 09:02 AM   Modules accepted: Orders

## 2017-11-26 NOTE — Patient Instructions (Signed)
Medication Instructions:  Decrease Spironolactone to 12.5 mg (1/2 tab) daily.  Decrease Bystolic (nebivolol) to 2.5 mg daily.---HOLD this the evening before your endoscopy.  START Hydralazine 25 mg twice daily.  If you need a refill on your cardiac medications before your next appointment, please call your pharmacy.   Lab work: Your physician recommends that you return for lab work in: 2 weeks (BMET)  If you have labs (blood work) drawn today and your tests are completely normal, you will receive your results only by: Marland Kitchen MyChart Message (if you have MyChart) OR . A paper copy in the mail If you have any lab test that is abnormal or we need to change your treatment, we will call you to review the results.  Testing/Procedures: none  Follow-Up: At [facility], you and your health needs are our priority.  As part of our continuing mission to provide you with exceptional heart care, we have created designated Provider Care Teams.  These Care Teams include your primary Cardiologist (physician) and Advanced Practice Providers (APPs -  Physician Assistants and Nurse Practitioners) who all work together to provide you with the care you need, when you need it. You are scheduled for a follow-up appointment with Dr. Claiborne Billings next month (12/24/17 at 2:40 pm)   Advanced Practice Providers on your designated Care Team: [place], [place] . Fabian Sharp, PA-C  Any Other Special Instructions Will Be Listed Below (If Applicable). none

## 2017-11-26 NOTE — Progress Notes (Signed)
Cardiology Office Note    Date:  11/26/2017   ID:  Kristy Dougherty, DOB Dec 06, 1950, MRN [id number]  PCP:  [name], MD  Cardiologist:  Dr. Claiborne Billings GI: Dr. Katherine Basset of [facility]  Chief Complaint  Patient presents with  . Follow-up    BP management, recent bradycardia, GI procedure delayed    History of Present Illness:  Kristy Dougherty is a 67 y.o. female with PMH of OSA on CPAP, tobacco abuse, HTN, HLD, DM II, and obesity.  She had a cardiac catheterization in 2011 which showed normal coronaries.  She was found to have severe obstructive sleep apnea in the same year.  Her Bystolic was reduced in 2542 due to bradycardia.  Patient has been complaining of generalized weakness and shortness of breath with exertion.  She was admitted to the hospital on 11/07/2017 with symptomatic anemia with hemoglobin of 6.6.  Potassium level was 6.5 on arrival.  Creatinine was elevated at 1.89.  She was transfused with 2 units of packed red blood cell.  She was not on any blood thinners at the time.  Symptoms improved after blood transfusion.  Since she has a history of peptic ulcer disease.  She was referred to follow-up with fecal GI as outpatient.  She was instructed to hold any NSAIDs.  During the last office visit, I changed Lasix to as needed.  I also increased Bystolic to 5 mg daily and restart her spironolactone 25 mg daily.  Repeat lab work obtained on 11/16/2017 showed potassium remain elevated at 5.3.  I decreased her spironolactone to 12.5 mg daily.  Patient presents today for cardiology office visit.  She was not aware to instruction to decrease the spironolactone.  I instructed her to start decreasing the spironolactone to 12.5 mg today.  I will also reduce her Bystolic to the previous 2.5 mg daily.  I added hydralazine 25 mg twice daily.  She is scheduled for a basic metabolic panel in 2 weeks to monitor her hyperkalemia.  She was unable to proceed with a EGD recently due to significant  bradycardia of 37, however no EKG was obtained.  With the reduction of the Bystolic, this should help with her heart rate.  Her heart rate is 57 today.  In the future, if she were to undergo EGD again, I think she can hold her Bystolic the night before which should raise her blood pressure during anesthesia.  She is okay from cardiology perspective to proceed with a GI procedure at this point.    Past Medical History:  Diagnosis Date  . Diabetes mellitus without complication ([place])   . Hyperlipemia   . Hypertension   . Normal coronary arteries 2011  . Obesity   . OSA on CPAP   . Shingles July 2014    Past Surgical History:  Procedure Laterality Date  . ABDOMINAL HYSTERECTOMY  1983  . CARDIAC CATHETERIZATION  04/02/2009   Normal coronary arteries  . US ECHOCARDIOGRAPHY  03/22/2009   EF =>55%,trace MR,mild TR & PI    Current Medications: Outpatient Medications Prior to Visit  Medication Sig Dispense Refill  . acetaminophen (TYLENOL) 500 MG tablet Take 500-1,000 mg by mouth every 6 (six) hours as needed (for pain).    Marland Kitchen albuterol (PROVENTIL HFA) 108 (90 Base) MCG/ACT inhaler Inhale 2 puffs into the lungs every 6 (six) hours as needed for wheezing or shortness of breath. 1 Inhaler 0  . amLODipine (NORVASC) 10 MG tablet Take 1 tablet (10 mg  total) by mouth daily. 30 tablet 0  . budesonide-formoterol (SYMBICORT) 160-4.5 MCG/ACT inhaler Inhale 2 puffs into the lungs 2 (two) times daily.    . Choline Fenofibrate (FENOFIBRIC ACID) 135 MG CPDR Take 135 mg by mouth daily.    . cyclobenzaprine (FLEXERIL) 5 MG tablet Take 5 mg by mouth 3 (three) times daily as needed for muscle spasms.    Marland Kitchen esomeprazole (NEXIUM) 40 MG capsule Take 40 mg by mouth daily before breakfast.    . FLUoxetine (PROZAC) 20 MG capsule Take 20 mg by mouth daily.    . fluticasone (FLONASE) 50 MCG/ACT nasal spray Place 1-2 sprays into both nostrils daily as needed for allergies or rhinitis.    . folic acid (FOLVITE) 1 MG tablet  Take 1 mg by mouth daily.    . furosemide (LASIX) 40 MG tablet Take 1 tablet (40 mg total) by mouth as needed for edema. 90 tablet 3  . gabapentin (NEURONTIN) 100 MG capsule Take 300 mg by mouth at bedtime.     Marland Kitchen lisinopril-hydrochlorothiazide (PRINZIDE,ZESTORETIC) 20-12.5 MG per tablet Take 1 tablet by mouth 2 (two) times daily.     . metFORMIN (GLUCOPHAGE) 1000 MG tablet Take 1,000 mg by mouth 2 (two) times daily with a meal.    . methotrexate (RHEUMATREX) 2.5 MG tablet Take 15 mg by mouth every Tuesday. Caution:Chemotherapy. Protect from light.    . nicotine (NICODERM CQ - DOSED IN MG/24 HOURS) 21 mg/24hr patch Place 1 patch (21 mg total) onto the skin daily. 28 patch 0  . rosuvastatin (CRESTOR) 40 MG tablet TAKE 1 TABLET BY MOUTH EVERY DAY (Patient taking differently: Take 40 mg by mouth daily. ) 90 tablet 3  . traMADol (ULTRAM) 50 MG tablet Take 50 mg by mouth every 6 (six) hours as needed (for pain).     . TRESIBA FLEXTOUCH 200 UNIT/ML SOPN Inject 125 Units into the skin daily before breakfast.     . VASCEPA 1 g CAPS TAKE 2 CAPSULES BY MOUTH 2 (TWO) TIMES DAILY. 360 capsule 3  . nebivolol (BYSTOLIC) 5 MG tablet Take 1 tablet (5 mg total) by mouth daily. 90 tablet 3  . spironolactone (ALDACTONE) 25 MG tablet Take 1 tablet (25 mg total) by mouth daily. 90 tablet 3   No facility-administered medications prior to visit.      Allergies:   Patient has no known allergies.   Social History   Socioeconomic History  . Marital status: Widowed    Spouse name: Not on file  . Number of children: Not on file  . Years of education: Not on file  . Highest education level: Not on file  Occupational History  . Not on file  Social Needs  . Financial resource strain: Not on file  . Food insecurity:    Worry: Not on file    Inability: Not on file  . Transportation needs:    Medical: Not on file    Non-medical: Not on file  Tobacco Use  . Smoking status: Current Every Day Smoker    Packs/day:  1.00    Years: 40.00    Pack years: 40.00    Types: Cigarettes  . Smokeless tobacco: Never Used  Substance and Sexual Activity  . Alcohol use: No  . Drug use: No  . Sexual activity: Not on file  Lifestyle  . Physical activity:    Days per week: Not on file    Minutes per session: Not on file  . Stress: Not on  file  Relationships  . Social connections:    Talks on phone: Not on file    Gets together: Not on file    Attends religious service: Not on file    Active member of club or organization: Not on file    Attends meetings of clubs or organizations: Not on file    Relationship status: Not on file  Other Topics Concern  . Not on file  Social History Narrative  . Not on file     Family History:  The patient's family history includes Cancer in her mother; Heart attack in her father.   ROS:   Please see the history of present illness.    ROS All other systems reviewed and are negative.   PHYSICAL EXAM:   VS:  BP (!) 150/54   Pulse (!) 57   Ht 5\' 9"  (1.753 m)   Wt 235 lb 3.2 oz (106.7 kg)   BMI 34.73 kg/m    GEN: Well nourished, well developed, in no acute distress  HEENT: normal  Neck: no JVD, carotid bruits, or masses Cardiac: RRR; no murmurs, rubs, or gallops,no edema  Respiratory:  clear to auscultation bilaterally, normal work of breathing GI: soft, nontender, nondistended, + BS MS: no deformity or atrophy  Skin: warm and dry, no rash Neuro:  Alert and Oriented x 3, Strength and sensation are intact Psych: euthymic mood, full affect  Wt Readings from Last 3 Encounters:  11/26/17 235 lb 3.2 oz (106.7 kg)  11/12/17 234 lb (106.1 kg)  11/07/17 249 lb (112.9 kg)      Studies/Labs Reviewed:   EKG:  EKG is ordered today.  The ekg ordered today demonstrates sinus bradycardia, heart rate 45  Recent Labs: 11/07/2017: ALT 12 11/08/2017: Hemoglobin 8.2; Platelets 237 11/16/2017: BUN 35; Creatinine, Ser 1.36; Potassium 5.3; Sodium 135   Lipid Panel No  results found for: CHOL, TRIG, HDL, CHOLHDL, VLDL, LDLCALC, LDLDIRECT  Additional studies/ records that were reviewed today include:   Echo 03/13/2016 LV EF: 60% - 65% Study Conclusions  - Left ventricle: The cavity size was normal. Wall thickness was increased in a pattern of moderate LVH. Systolic function was normal. The estimated ejection fraction was in the range of 60% to 65%. Left ventricular diastolic function parameters were normal. - Mitral valve: There was mild regurgitation. - Pulmonary arteries: PA peak pressure: 41 mm Hg (S).    ASSESSMENT:    1. Essential hypertension   2. Anemia, unspecified type   3. Hyperkalemia   4. Hyperlipidemia, unspecified hyperlipidemia type   5. Controlled type 2 diabetes mellitus without complication, with long-term current use of insulin ([place])   6. OSA on CPAP   7. Morbid obesity ([place])      PLAN:  In order of problems listed above:  1. Hypertension: Blood pressure remain elevated.  I had to decrease his spironolactone to 12.5 mg daily due to persistent hyperkalemia.  I also decreased his Bystolic to 2.5 mg daily due to episodic symptomatic bradycardia.  I will add hydralazine 25 mg twice daily to help control the blood pressure.  2. Hyperkalemia: Reduce spironolactone to 12.5 mg daily.  Basic metabolic panel in 2 weeks  3. Anemia: Pending upcoming EGD by GI.  4. Hyperlipidemia: On Crestor 40 mg daily  5. Obstructive sleep apnea on CPAP: Managed by Dr. Claiborne Billings.  6. DM2: She says she has been having some low blood sugar recently on insulin.  I will defer this to primary care provider.  Medication Adjustments/Labs and Tests Ordered: Current medicines are reviewed at length with the patient today.  Concerns regarding medicines are outlined above.  Medication changes, Labs and Tests ordered today are listed in the Patient Instructions below. Patient Instructions  Medication Instructions:  Decrease Spironolactone to  12.5 mg (1/2 tab) daily.  Decrease Bystolic (nebivolol) to 2.5 mg daily.---HOLD this the evening before your endoscopy.  START Hydralazine 25 mg twice daily.  If you need a refill on your cardiac medications before your next appointment, please call your pharmacy.   Lab work: Your physician recommends that you return for lab work in: 2 weeks (BMET)  If you have labs (blood work) drawn today and your tests are completely normal, you will receive your results only by: Marland Kitchen MyChart Message (if you have MyChart) OR . A paper copy in the mail If you have any lab test that is abnormal or we need to change your treatment, we will call you to review the results.  Testing/Procedures: none  Follow-Up: At [facility], you and your health needs are our priority.  As part of our continuing mission to provide you with exceptional heart care, we have created designated Provider Care Teams.  These Care Teams include your primary Cardiologist (physician) and Advanced Practice Providers (APPs -  Physician Assistants and Nurse Practitioners) who all work together to provide you with the care you need, when you need it. You are scheduled for a follow-up appointment with Dr. Claiborne Billings next month (12/24/17 at 2:40 pm)   Advanced Practice Providers on your designated Care Team: [place], [place] . Fabian Sharp, PA-C  Any Other Special Instructions Will Be Listed Below (If Applicable). none      Hilbert Corrigan, [place]  11/26/2017 8:45 AM    [address], [place], [place]  [address] Phone: [phone]; Fax: [phone]

## 2017-12-01 ENCOUNTER — Other Ambulatory Visit: Payer: Self-pay | Admitting: Cardiovascular Disease

## 2017-12-13 ENCOUNTER — Encounter: Payer: Self-pay | Admitting: *Deleted

## 2017-12-24 ENCOUNTER — Ambulatory Visit: Payer: BLUE CROSS/BLUE SHIELD | Admitting: Cardiovascular Disease

## 2017-12-24 ENCOUNTER — Encounter: Payer: Self-pay | Admitting: Cardiovascular Disease

## 2017-12-24 VITALS — BP 171/61 | HR 67 | Ht 68.0 in | Wt 240.4 lb

## 2017-12-24 DIAGNOSIS — I10 Essential (primary) hypertension: Secondary | ICD-10-CM | POA: Diagnosis not present

## 2017-12-24 DIAGNOSIS — D500 Iron deficiency anemia secondary to blood loss (chronic): Secondary | ICD-10-CM

## 2017-12-24 DIAGNOSIS — G4733 Obstructive sleep apnea (adult) (pediatric): Secondary | ICD-10-CM | POA: Diagnosis not present

## 2017-12-24 DIAGNOSIS — E119 Type 2 diabetes mellitus without complications: Secondary | ICD-10-CM

## 2017-12-24 DIAGNOSIS — Z79899 Other long term (current) drug therapy: Secondary | ICD-10-CM | POA: Diagnosis not present

## 2017-12-24 DIAGNOSIS — Z794 Long term (current) use of insulin: Secondary | ICD-10-CM

## 2017-12-24 DIAGNOSIS — D649 Anemia, unspecified: Secondary | ICD-10-CM

## 2017-12-24 DIAGNOSIS — Z9989 Dependence on other enabling machines and devices: Secondary | ICD-10-CM

## 2017-12-24 MED ORDER — TORSEMIDE 20 MG PO TABS: 20.0000 mg | ORAL_TABLET | Freq: Every day | ORAL | 3 refills | Status: DC

## 2017-12-24 MED ORDER — AMLODIPINE BESYLATE 10 MG PO TABS: 10.0000 mg | ORAL_TABLET | Freq: Every day | ORAL | 3 refills | Status: DC

## 2017-12-24 NOTE — Patient Instructions (Signed)
Medication Instructions:  STOP furosemide  START torsemide 20 mg daily   If you need a refill on your cardiac medications before your next appointment, please call your pharmacy.   Lab work: Please return for labs in 1 weeks (BMET, CBC, iron/TIBC/Fe)  Our in office lab hours are Monday-Friday 8:00-4:00, closed for lunch 12:45-1:45 pm.  No appointment needed.  If you have labs (blood work) drawn today and your tests are completely normal, you will receive your results only by: Marland Kitchen MyChart Message (if you have MyChart) OR . A paper copy in the mail If you have any lab test that is abnormal or we need to change your treatment, we will call you to review the results.  Follow-Up: At [facility], you and your health needs are our priority.  As part of our continuing mission to provide you with exceptional heart care, we have created designated Provider Care Teams.  These Care Teams include your primary Cardiologist (physician) and Advanced Practice Providers (APPs -  Physician Assistants and Nurse Practitioners) who all work together to provide you with the care you need, when you need it. ---4-6 weeks with Kristy Deforest PA and 3 months with Dr. Claiborne Dougherty

## 2017-12-24 NOTE — Progress Notes (Signed)
Cardiology Office Note    Date:  12/25/2017   ID:  Kristy Dougherty, DOB 1950-12-17, MRN [id number]  PCP:  Kristy Neer, MD  Cardiologist:  Kristy Majestic, MD   Chief Complaint  Patient presents with  . Follow-up  . Edema    History of Present Illness:  Kristy Dougherty is a 67 y.o. female who presents for a 13 month follow-up cardiology evaluation.  [name] has a history of obesity, hypertension, type 2 diabetes mellitus, marked mixed hyperlipidemia with an atherogenic profile, and severe obstructive sleep apnea.  In 2011, she had undergone a cardiac catheterization by Dr. Tina [place] and had normal coronary arteries.  In 2011 she was referred for a sleep study and was found to have severe obstructive sleep apnea with an AHI of 68.3 per hour.  She had significant oxygen desaturation to 74% and had loud snoring.  She underwent CPAP titration CPAP pressure of 12 cm water pressure was recommended.  Her DME company used to be [facility] [facility], but they have gone out of business.  She has not had supplies in several years.  Her mask is very old and des not have a good seal. She admits to 100% use. She typically goes to bed at 9:30 - 10 pm and wakes up at 5:30 am.  She goes to the bathroom typically one time per night. [employer] [facility] is her DME company for her CPAP supplies.  She admits to shortness of breath that has increased recently. She continues to smoke and has been smoking for at least 45 years currently up to 1 ppd. She has been on amlodipine 10 mg, lisinopril HCT 20/12.5 but she has been taking this twice a day, and Bystolic 5 mg.  She is diabetic on Levemir, Victoza and Tresiba, and for hyperlipidemia.  She has been taking Vytorin 10/20 in addition to Trilipix135 mg daily.  She has a peripheral neuropathy on gabapentin 300 mg twice a day.  She also has psoriatic arthritis and has been taking methotrexate.  When I  saw her In January 2018 she was hypertensive and bradycardic.  At that time, I reduced  her Bystolic and added spironolactone 12.5 mg daily.  She also had lab work done on 03/21/16  lab and she had the results on her phone, which I reviewed with her in detail.  Her GFR was 48.  BUN 20, Cr 1.14.  Potassium 4.6, with the addition of spironolactone.  Hemoglobin A1c was increased at 6.9.  Total cholesterol 194, HDL 38, triglycerides 320, LDL 91, and non-HDL cholesterol 156.  She denies any recent chest pressure.  She denies palpitations.   When I saw her in March 2018  I recommended further increase of spironolactone from 12.5 twice a day with further titration to 25 mg twice a day.  Blood pressure remained elevated.  I discontinued Vytorin in light of her persistent elevation of lipid studies and started her on Crestor 20 mg to take in addition to her try lipids 135 mg and recommended omega-3 fatty acid supplementation.  She underwent follow-up laboratory by Dr. Serita [facility] on 06/19/2016.  BUN 22, chronic 1.05.  LFTs were normal.  Her total cholesterol was 203, HDL 37, triglycerides had increased to 440 and non-HDL cholesterol was 166.  When I last saw her in October 2018  Continued to smoke three quarters a pack of cigarettes per day.  She states that she has not been very rested with her diet.  She does not get much exercise.  She does have some hip discomfort, which she blames secondary to Crestor.  She had seen Kristy [facility]herty. .  She has been on a reduced dose of amlodipine at just 5 mg due to some prior ankle swelling but she believes the ankle swelling resolved once she increased spironolactone.  Follow-up blood work on 10/25/2016 showed a total cholesterol 160, HDL 33, LDL 66, but triglycerides remain significantly elevated at 436.  Hemoglobin A1c was 7.1.  Calcium was 10.4.  She had stable renal function.  She denies any chest tightness.  She was using  CPAP with 100% compliance.    Since I saw her, she was in the hospital overnight on November 07, 2017 with generalized weakness and shortness of  breath with exertion.  He had symptomatic anemia with a hemoglobin of 6.6.  Potassium was 6.5 and creatinine 1.89.  She received 2 units of packed red cell transfusion.  Symptoms improved with red blood cell replacement.  She has a history of peptic ulcer disease.  She was supposed to have endoscopy with [facility] but apparently she was significant he bradycardic and this was canceled.  She saw Kristy Dougherty back in follow-up on November 26, 2017.  Her Bystolic was decreased to 2.5 mg and he added hydralazine 25 mg twice a day for blood pressure control.  She has now been on iron therapy since her hospitalization.  She presents for evaluation.  Past Medical History:  Diagnosis Date  . Diabetes mellitus without complication ([facility])   . Hyperlipemia   . Hypertension   . Normal coronary arteries 2011  . Obesity   . OSA on CPAP   . Shingles July 2014    Past Surgical History:  Procedure Laterality Date  . ABDOMINAL HYSTERECTOMY  1983  . CARDIAC CATHETERIZATION  04/02/2009   Normal coronary arteries  . US ECHOCARDIOGRAPHY  03/22/2009   EF =>55%,trace MR,mild TR & PI    Current Medications: Outpatient Medications Prior to Visit  Medication Sig Dispense Refill  . acetaminophen (TYLENOL) 500 MG tablet Take 500-1,000 mg by mouth every 6 (six) hours as needed (for pain).    Marland Kitchen albuterol (PROVENTIL HFA) 108 (90 Base) MCG/ACT inhaler Inhale 2 puffs into the lungs every 6 (six) hours as needed for wheezing or shortness of breath. 1 Inhaler 0  . budesonide-formoterol (SYMBICORT) 160-4.5 MCG/ACT inhaler Inhale 2 puffs into the lungs 2 (two) times daily.    . Choline Fenofibrate (FENOFIBRIC ACID) 135 MG CPDR Take 135 mg by mouth daily.    . cyclobenzaprine (FLEXERIL) 5 MG tablet Take 5 mg by mouth 3 (three) times daily as needed for muscle spasms.    Marland Kitchen esomeprazole (NEXIUM) 40 MG capsule Take 40 mg by mouth daily before breakfast.    . FLUoxetine (PROZAC) 20 MG capsule Take 20 mg by mouth daily.      . fluticasone (FLONASE) 50 MCG/ACT nasal spray Place 1-2 sprays into both nostrils daily as needed for allergies or rhinitis.    . folic acid (FOLVITE) 1 MG tablet Take 1 mg by mouth daily.    Marland Kitchen gabapentin (NEURONTIN) 100 MG capsule Take 300 mg by mouth at bedtime.     . hydrALAZINE (APRESOLINE) 25 MG tablet Take 1 tablet (25 mg total) by mouth 2 (two) times daily. 180 tablet 1  . lisinopril-hydrochlorothiazide (PRINZIDE,ZESTORETIC) 20-12.5 MG per tablet Take 1 tablet by mouth 2 (two) times daily.     . metFORMIN (GLUCOPHAGE) 1000 MG tablet Take 1,000 mg  by mouth 2 (two) times daily with a meal.    . methotrexate (RHEUMATREX) 2.5 MG tablet Take 15 mg by mouth every Tuesday. Caution:Chemotherapy. Protect from light.    . nebivolol (BYSTOLIC) 2.5 MG tablet Take 1 tablet (2.5 mg total) by mouth daily. 90 tablet 1  . nicotine (NICODERM CQ - DOSED IN MG/24 HOURS) 21 mg/24hr patch Place 1 patch (21 mg total) onto the skin daily. 28 patch 0  . rosuvastatin (CRESTOR) 40 MG tablet TAKE 1 TABLET BY MOUTH EVERY DAY (Patient taking differently: Take 40 mg by mouth daily. ) 90 tablet 3  . spironolactone (ALDACTONE) 25 MG tablet Take 0.5 tablets (12.5 mg total) by mouth daily. 45 tablet 3  . traMADol (ULTRAM) 50 MG tablet Take 50 mg by mouth every 6 (six) hours as needed (for pain).     . TRESIBA FLEXTOUCH 200 UNIT/ML SOPN Inject 125 Units into the skin daily before breakfast.     . VASCEPA 1 g CAPS TAKE 2 CAPSULES BY MOUTH 2 (TWO) TIMES DAILY. 360 capsule 3  . amLODipine (NORVASC) 10 MG tablet Take 1 tablet (10 mg total) by mouth daily. 30 tablet 0  . furosemide (LASIX) 40 MG tablet Take 1 tablet (40 mg total) by mouth as needed for edema. 90 tablet 3   No facility-administered medications prior to visit.      Allergies:   Patient has no known allergies.   Social History   Socioeconomic History  . Marital status: Widowed    Spouse name: Not on file  . Number of children: Not on file  . Years of  education: Not on file  . Highest education level: Not on file  Occupational History  . Not on file  Social Needs  . Financial resource strain: Not on file  . Food insecurity:    Worry: Not on file    Inability: Not on file  . Transportation needs:    Medical: Not on file    Non-medical: Not on file  Tobacco Use  . Smoking status: Current Every Day Smoker    Packs/day: 1.00    Years: 40.00    Pack years: 40.00    Types: Cigarettes  . Smokeless tobacco: Never Used  Substance and Sexual Activity  . Alcohol use: No  . Drug use: No  . Sexual activity: Not on file  Lifestyle  . Physical activity:    Days per week: Not on file    Minutes per session: Not on file  . Stress: Not on file  Relationships  . Social connections:    Talks on phone: Not on file    Gets together: Not on file    Attends religious service: Not on file    Active member of club or organization: Not on file    Attends meetings of clubs or organizations: Not on file    Relationship status: Not on file  Other Topics Concern  . Not on file  Social History Narrative  . Not on file    Additional social history is notable in that her husband passed away after developing sepsis.  She works for [employer] in the [employer].  She is smoking 3/4-1 pack of cigarettes per day and has been smoking for 45 to almost 50 years.  She has been widowed since 2010.  She has 1 daughter age 66 and 5 grandchildren.  Family History:  The patient's family history includes Heart attack in her father; Lung cancer in her  mother.  Her mother had lung CA.  Father died in his 68s but had numerous heart problems.  ROS General: Negative; No fevers, chills, or night sweats;  HEENT: Negative; No changes in vision or hearing, sinus congestion, difficulty swallowing Pulmonary: Negative; No cough, wheezing, shortness of breath, hemoptysis Cardiovascular:  See HPI GI: Negative; No nausea, vomiting, diarrhea, or abdominal  pain GU: Negative; No dysuria, hematuria, or difficulty voiding Musculoskeletal: Negative; no myalgias, joint pain, or weakness Rheumatologic: Positive for psoriatic arthritis Hematologic/Oncology: Negative; no easy bruising, bleeding Endocrine: Positive for diabetes mellitus Neuro: Negative; no changes in balance, headaches Skin: Negative; No rashes or skin lesions Psychiatric: Negative; No behavioral problems, depression Sleep: Positive for severe obstructive sleep apnea with prior loud snoring, daytime sleepiness, hypersomnolence, no bruxism, restless legs, hypnogognic hallucinations, no cataplexy Other comprehensive 14 point system review is negative.   PHYSICAL EXAM:   VS:  BP (!) 171/61   Doug[place] 67   Ht '5\' 8"'$  (1.727 m)   Wt 240 lb 6.4 oz (109 kg)   BMI 36.55 kg/m     Repeat blood pressure by me remained elevated at 178/70  Wt Readings from Last 3 Encounters:  12/24/17 240 lb 6.4 oz (109 kg)  11/26/17 235 lb 3.2 oz (106.7 kg)  11/12/17 234 lb (106.1 kg)    General: Alert, oriented, no distress.  Skin: Multiple skin excoriations from scratching HEENT: Normocephalic, atraumatic. Pupils equal round and reactive to light; sclera anicteric; extraocular muscles intact;  Nose without nasal septal hypertrophy Mouth/Parynx benign; Mallinpatti scale 3 Neck: No JVD, no carotid bruits; normal carotid upstroke Lungs: clear to ausculatation and percussion; no wheezing or rales Chest wall: without tenderness to palpitation Heart: PMI not displaced, RRR, s1 s2 normal, 2/6 systolic murmur, no diastolic murmur, no rubs, gallops, thrills, or heaves Abdomen: Moderate central adiposity; soft, nontender; no hepatosplenomehaly, BS+; abdominal aorta nontender and not dilated by palpation. Back: no CVA tenderness Pulses 2+ Musculoskeletal: full range of motion, normal strength, no joint deformities Extremities: no clubbing cyanosis or edema, Homan's sign negative  Neurologic: grossly nonfocal;  Cranial nerves grossly wnl Psychologic: Normal mood and affect   Studies/Labs Reviewed:   ECG (independently read by me): Sinus rhythm at 67 bpm.  No ectopy.  Normal intervals.  October 2018 ECG (independently read by me): Sinus bradycardia 56 bpm.  Normal intervals.  No ectopy.  June 2018 ECG (independently read by me): Sinus bradycardia 54 bpm.  PR interval 196, QTc interval 398 ms.  No ST segment changes.  March 2018 ECG (independently read by me): Sinus bradycardia 54 bpm.  No ectopy.  Normal intervals.  January 2018 EKG:  EKG is  ordered today.  The ekg independently reviewed by me shows sinus bradycardia at 47 bpm.  QTc interval 398 ms.  PR interval 192 ms.  Recent Labs: BMP Latest Ref Rng & Units 11/16/2017 11/08/2017 11/07/2017  Glucose 65 - 99 mg/dL 84 122(H) 160(H)  BUN 8 - 27 mg/dL 35(H) 33(H) 40(H)  Creatinine 0.57 - 1.00 mg/dL 1.36(H) 1.40(H) 1.89(H)  BUN/Creat Ratio 12 - 28 26 - -  Sodium 134 - 144 mmol/L 135 137 136  Potassium 3.5 - 5.2 mmol/L 5.3(H) 5.4(H) 6.5(HH)  Chloride 96 - 106 mmol/L 99 105 106  CO2 20 - 29 mmol/L 22 23 21(L)  Calcium 8.7 - 10.3 mg/dL 9.0 8.9 8.6(L)     Hepatic Function Latest Ref Rng & Units 11/07/2017  Total Protein 6.5 - 8.1 g/dL 6.6  Albumin 3.5 - 5.0  g/dL 3.0(L)  AST 15 - 41 U/L 26  ALT 0 - 44 U/L 12  Alk Phosphatase 38 - 126 U/L 73  Total Bilirubin 0.3 - 1.2 mg/dL 0.4    CBC Latest Ref Rng & Units 11/08/2017 11/07/2017 08/13/2012  WBC 4.0 - 10.5 K/uL 10.3 10.1 11.9(H)  Hemoglobin 12.0 - 15.0 g/dL 8.2(L) 6.6(LL) 13.0  Hematocrit 36.0 - 46.0 % 28.1(L) 23.9(L) 36.9  Platelets 150 - 400 K/uL 237 267 253   Lab Results  Component Value Date   MCV 88.9 11/08/2017   MCV 91.9 11/07/2017   MCV 90.0 08/13/2012   No results found for: TSH No results found for: HGBA1C   BNP No results found for: BNP  ProBNP No results found for: PROBNP   Lipid Panel  No results found for: CHOL, TRIG, HDL, CHOLHDL, VLDL, LDLCALC,  LDLDIRECT   RADIOLOGY: No results found.   Additional studies/ records that were reviewed today include:  I reviewed the patient's diagnostic sleep study and CPAP titration studies from April and June 2011.  I reviewed prior medical records.  Also reviewed laboratory from [facility] from last year, which showed a BUN of 23 Cr 1.05.;  Normal LFTs.  I personally review blood work from from 04/18/2016 done at [place].  I reviewed the recent blood work from 06/19/2016 by [facility] as noted above.  Reviewed her recent hospitalization and subsequent office visits with Kristy [facility], [facility]  ASSESSMENT:    1. Essential hypertension   2. Iron deficiency anemia due to chronic blood loss   3. Medication management   4. OSA on CPAP   5. Morbid obesity ([place])   6. Controlled type 2 diabetes mellitus without complication, with long-term current use of insulin ([place])   7. Anemia, unspecified type     PLAN:  Ms Gaila Engebretsen Is a 67 year old widowed Caucasian female who has significant cardiovascular comorbidities including hypertension, type 2 diabetes mellitus, mixed hyperlipidemia, with remote triglyceride elevations in excess of 500 and severe obstructive sleep apnea since 2011.  Since that time, she has been on CPAP therapy.  She continues to use CPAP but at times has fallen asleep in a recliner.  She is status post recent packed red blood cell transfusion for significant anemia with a hemoglobin of 6.6.  Blood pressure today remains elevated despite taking amlodipine 10 mg, hydralazine 25 mg twice a day, lisinopril HCT 20/12.5 mg, Spironolactone 12.5 mg daily in addition to her reduced dose of Bystolic at 2.5 mg.  She had stopped taking furosemide since she did not feel it made a difference.  She is no longer bradycardic with resting Dougherty in the 60s.  She continues to have swelling and rather than take furosemide I have suggested torsemide 20 mg daily.  If her blood pressure remains elevated hydralazine may  need to be  increased.  Her skin excoriations are secondary to scratching.  She is now on Nexium 40 mg daily with her history of peptic ulcer disease.  With reference to lipids she is now on rosuvastatin 40 mg, fenofibrate in addition to the Vascepa 2 capsules twice a day.  I discussed with her the Reduce-it trial data.  Most recent lipid studies show a total cholesterol 127 LDL 66 but triglycerides remain elevated at 172 and HDL is low at 26.  Creatinine is stable at 1.36 with recent potassium at 5.3.  She is diabetic on Tresiba and metformin.  In 1 week she will undergo a be met, CBC, and I  will obtain iron studies.  I have recommended she see Kristy Deforest, PA in 4-6 weeks and see me in 3 months for reevaluation   Medication Adjustments/Labs and Tests Ordered: Current medicines are reviewed at length with the patient today.  Concerns regarding medicines are outlined above.  Medication changes, Labs and Tests ordered today are listed in the Patient Instructions below.  Patient Instructions  Medication Instructions:  STOP furosemide  START torsemide 20 mg daily   If you need a refill on your cardiac medications before your next appointment, please call your pharmacy.   Lab work: Please return for labs in 1 weeks (BMET, CBC, iron/TIBC/Fe)  Our in office lab hours are Monday-Friday 8:00-4:00, closed for lunch 12:45-1:45 pm.  No appointment needed.  If you have labs (blood work) drawn today and your tests are completely normal, you will receive your results only by: Marland Kitchen MyChart Message (if you have MyChart) OR . A paper copy in the mail If you have any lab test that is abnormal or we need to change your treatment, we will call you to review the results.  Follow-Up: At [facility], you and your health needs are our priority.  As part of our continuing mission to provide you with exceptional heart care, we have created designated Provider Care Teams.  These Care Teams include your primary  Cardiologist (physician) and Advanced Practice Providers (APPs -  Physician Assistants and Nurse Practitioners) who all work together to provide you with the care you need, when you need it. ---4-6 weeks with Kristy Deforest PA and 3 months with Dr. Claiborne Billings     Signed, Kristy Majestic, MD, [facility]  12/25/2017 4:50 PM    [place] [address] [place], [place], [place]  [address] Phone: [phone]

## 2017-12-25 ENCOUNTER — Encounter: Payer: Self-pay | Admitting: Cardiovascular Disease

## 2018-01-21 ENCOUNTER — Encounter: Payer: Self-pay | Admitting: Physician Assistant

## 2018-01-21 ENCOUNTER — Ambulatory Visit: Payer: BLUE CROSS/BLUE SHIELD | Admitting: Physician Assistant

## 2018-01-21 VITALS — BP 150/52 | HR 58 | Ht 68.0 in | Wt 238.2 lb

## 2018-01-21 DIAGNOSIS — E785 Hyperlipidemia, unspecified: Secondary | ICD-10-CM

## 2018-01-21 DIAGNOSIS — R600 Localized edema: Secondary | ICD-10-CM

## 2018-01-21 DIAGNOSIS — G4733 Obstructive sleep apnea (adult) (pediatric): Secondary | ICD-10-CM | POA: Diagnosis not present

## 2018-01-21 DIAGNOSIS — I10 Essential (primary) hypertension: Secondary | ICD-10-CM | POA: Diagnosis not present

## 2018-01-21 DIAGNOSIS — Z9989 Dependence on other enabling machines and devices: Secondary | ICD-10-CM

## 2018-01-21 DIAGNOSIS — E119 Type 2 diabetes mellitus without complications: Secondary | ICD-10-CM

## 2018-01-21 MED ORDER — TORSEMIDE 20 MG PO TABS: ORAL_TABLET | ORAL | 3 refills | Status: DC

## 2018-01-21 NOTE — Patient Instructions (Signed)
Medication Instructions:  Increase Torsemide to 30 mg daily (take 1 1/2 tablet of 20 mg) If you need a refill on your cardiac medications before your next appointment, please call your pharmacy.   Lab work: BMET in one week If you have labs (blood work) drawn today and your tests are completely normal, you will receive your results only by: Marland Kitchen MyChart Message (if you have MyChart) OR . A paper copy in the mail If you have any lab test that is abnormal or we need to change your treatment, we will call you to review the results.  Testing/Procedures: Your physician has requested that you have a lower extremity venous duplex. This test is an ultrasound of the veins in the legs or arms. It looks at venous blood flow that carries blood from the heart to the legs or arms. Allow one hour for a Lower Venous exam. Allow thirty minutes for an Upper Venous exam. There are no restrictions or special instructions.   Follow-Up: At [facility], you and your health needs are our priority.  As part of our continuing mission to provide you with exceptional heart care, we have created designated Provider Care Teams.  These Care Teams include your primary Cardiologist (physician) and Advanced Practice Providers (APPs -  Physician Assistants and Nurse Practitioners) who all work together to provide you with the care you need, when you need it. Marland Kitchen Keep appointment with [facility] as scheduled.  Any Other Special Instructions Will Be Listed Below (If Applicable). Bring BP diary to next office visit.  Marland Kitchen

## 2018-01-21 NOTE — Progress Notes (Signed)
Cardiology Office Note    Date:  01/23/2018   ID:  Kristy Dougherty, DOB 1950/09/19, MRN [id number]  PCP:  [name], MD  Cardiologist:  Dr. Claiborne Billings GI: Dr. Katherine Basset of [facility]  Chief Complaint  Patient presents with  . Follow-up    seen for Dr. Claiborne Billings.    History of Present Illness:  Kristy Dougherty is a 67 y.o. female with PMH of OSA on CPAP, tobacco abuse, HTN, HLD, DM II,and obesity. She had a cardiac catheterization in 2011 which showed normal coronaries. She was found to have severe obstructive sleep apnea in the same year. Her Bystolic was reduced in 9381 due to bradycardia. Patient has been complaining of generalized weakness and shortness of breath with exertion. She was admitted to the hospital on 11/07/2017 with symptomatic anemia with hemoglobin of 6.6. Potassium level was 6.5 on arrival. Creatinine was elevated at 1.89. She was transfused with 2 units of packed red blood cell. She was not on any blood thinners at the time. Symptoms improved after blood transfusion. Since she has a history of peptic ulcer disease. She was referred to follow-up with fecal GI as outpatient. She was instructed to hold any NSAIDs.  I have previously tried to increase her Bystolic however this was reduced secondary to asymptomatic bradycardia.  I also increased her spironolactone at some point, however again the dosage was reduced back to a lower dose due to hyperkalemia.  I added hydralazine for additional blood pressure control.  Due to symptomatic anemia, she was scheduled to undergo GI procedure, this was delayed due to her significant bradycardia.  During the past office visit, Dr. Claiborne Billings switched her Lasix to torsemide 20 mg daily.  Patient presents today for cardiology office visit.  She continued to have lower extremity edema, it is worse on the right side compared to the left side.  I will order a lower extremity venous Doppler of the right lower extremity to rule out  DVT.  Otherwise I recommended to increase torsemide to 30 mg daily and have a one-week repeat basic metabolic panel.  She says she has obtained lab work and her GI doctor's office recently and was told that her renal function is stable.  Otherwise her lungs clear, there has been no significant orthopnea or PND.   Past Medical History:  Diagnosis Date  . Diabetes mellitus without complication ([place])   . Hyperlipemia   . Hypertension   . Normal coronary arteries 2011  . Obesity   . OSA on CPAP   . Shingles July 2014    Past Surgical History:  Procedure Laterality Date  . ABDOMINAL HYSTERECTOMY  1983  . CARDIAC CATHETERIZATION  04/02/2009   Normal coronary arteries  . US ECHOCARDIOGRAPHY  03/22/2009   EF =>55%,trace MR,mild TR & PI    Current Medications: Outpatient Medications Prior to Visit  Medication Sig Dispense Refill  . acetaminophen (TYLENOL) 500 MG tablet Take 500-1,000 mg by mouth every 6 (six) hours as needed (for pain).    Marland Kitchen albuterol (PROVENTIL HFA) 108 (90 Base) MCG/ACT inhaler Inhale 2 puffs into the lungs every 6 (six) hours as needed for wheezing or shortness of breath. 1 Inhaler 0  . amLODipine (NORVASC) 10 MG tablet Take 1 tablet (10 mg total) by mouth daily. 90 tablet 3  . budesonide-formoterol (SYMBICORT) 160-4.5 MCG/ACT inhaler Inhale 2 puffs into the lungs 2 (two) times daily.    . Choline Fenofibrate (FENOFIBRIC ACID) 135 MG CPDR Take 135 mg  by mouth daily.    . cyclobenzaprine (FLEXERIL) 5 MG tablet Take 5 mg by mouth 3 (three) times daily as needed for muscle spasms.    Marland Kitchen esomeprazole (NEXIUM) 40 MG capsule Take 40 mg by mouth daily before breakfast.    . FLUoxetine (PROZAC) 20 MG capsule Take 20 mg by mouth daily.    . fluticasone (FLONASE) 50 MCG/ACT nasal spray Place 1-2 sprays into both nostrils daily as needed for allergies or rhinitis.    . folic acid (FOLVITE) 1 MG tablet Take 1 mg by mouth daily.    Marland Kitchen gabapentin (NEURONTIN) 100 MG capsule Take 300 mg by  mouth at bedtime.     . hydrALAZINE (APRESOLINE) 25 MG tablet Take 1 tablet (25 mg total) by mouth 2 (two) times daily. 180 tablet 1  . lisinopril-hydrochlorothiazide (PRINZIDE,ZESTORETIC) 20-12.5 MG per tablet Take 1 tablet by mouth 2 (two) times daily.     . metFORMIN (GLUCOPHAGE) 1000 MG tablet Take 1,000 mg by mouth 2 (two) times daily with a meal.    . methotrexate (RHEUMATREX) 2.5 MG tablet Take 15 mg by mouth every Tuesday. Caution:Chemotherapy. Protect from light.    . nebivolol (BYSTOLIC) 2.5 MG tablet Take 1 tablet (2.5 mg total) by mouth daily. 90 tablet 1  . nicotine (NICODERM CQ - DOSED IN MG/24 HOURS) 21 mg/24hr patch Place 1 patch (21 mg total) onto the skin daily. 28 patch 0  . rosuvastatin (CRESTOR) 40 MG tablet TAKE 1 TABLET BY MOUTH EVERY DAY (Patient taking differently: Take 40 mg by mouth daily. ) 90 tablet 3  . spironolactone (ALDACTONE) 25 MG tablet Take 0.5 tablets (12.5 mg total) by mouth daily. 45 tablet 3  . traMADol (ULTRAM) 50 MG tablet Take 50 mg by mouth every 6 (six) hours as needed (for pain).     . TRESIBA FLEXTOUCH 200 UNIT/ML SOPN Inject 125 Units into the skin daily before breakfast.     . VASCEPA 1 g CAPS TAKE 2 CAPSULES BY MOUTH 2 (TWO) TIMES DAILY. 360 capsule 3  . torsemide (DEMADEX) 20 MG tablet Take 1 tablet (20 mg total) by mouth daily. 90 tablet 3   No facility-administered medications prior to visit.      Allergies:   Patient has no known allergies.   Social History   Socioeconomic History  . Marital status: Widowed    Spouse name: Not on file  . Number of children: Not on file  . Years of education: Not on file  . Highest education level: Not on file  Occupational History  . Not on file  Social Needs  . Financial resource strain: Not on file  . Food insecurity:    Worry: Not on file    Inability: Not on file  . Transportation needs:    Medical: Not on file    Non-medical: Not on file  Tobacco Use  . Smoking status: Current Every Day  Smoker    Packs/day: 1.00    Years: 40.00    Pack years: 40.00    Types: Cigarettes  . Smokeless tobacco: Never Used  Substance and Sexual Activity  . Alcohol use: No  . Drug use: No  . Sexual activity: Not on file  Lifestyle  . Physical activity:    Days per week: Not on file    Minutes per session: Not on file  . Stress: Not on file  Relationships  . Social connections:    Talks on phone: Not on file  Gets together: Not on file    Attends religious service: Not on file    Active member of club or organization: Not on file    Attends meetings of clubs or organizations: Not on file    Relationship status: Not on file  Other Topics Concern  . Not on file  Social History Narrative  . Not on file     Family History:  The patient's family history includes Heart attack in her father; Lung cancer in her mother.   ROS:   Please see the history of present illness.    ROS All other systems reviewed and are negative.   PHYSICAL EXAM:   VS:  BP (!) 150/52   Pulse (!) 58   Ht 5\' 8"  (1.727 m)   Wt 238 lb 3.2 oz (108 kg)   SpO2 91%   BMI 36.22 kg/m    GEN: Well nourished, well developed, in no acute distress  HEENT: normal  Neck: no JVD, carotid bruits, or masses Cardiac: RRR; no murmurs, rubs, or gallops. 1-2+ pitting edema, R>L  Respiratory:  clear to auscultation bilaterally, normal work of breathing GI: soft, nontender, nondistended, + BS MS: no deformity or atrophy  Skin: warm and dry, no rash Neuro:  Alert and Oriented x 3, Strength and sensation are intact Psych: euthymic mood, full affect  Wt Readings from Last 3 Encounters:  01/21/18 238 lb 3.2 oz (108 kg)  12/24/17 240 lb 6.4 oz (109 kg)  11/26/17 235 lb 3.2 oz (106.7 kg)      Studies/Labs Reviewed:   EKG:  EKG is not ordered today.    Recent Labs: 11/07/2017: ALT 12 11/08/2017: Hemoglobin 8.2; Platelets 237 11/16/2017: BUN 35; Creatinine, Ser 1.36; Potassium 5.3; Sodium 135   Lipid Panel No  results found for: CHOL, TRIG, HDL, CHOLHDL, VLDL, LDLCALC, LDLDIRECT  Additional studies/ records that were reviewed today include:   Echo 03/13/2016 LV EF: 60% -   65% Study Conclusions  - Left ventricle: The cavity size was normal. Wall thickness was   increased in a pattern of moderate LVH. Systolic function was   normal. The estimated ejection fraction was in the range of 60%   to 65%. Left ventricular diastolic function parameters were   normal. - Mitral valve: There was mild regurgitation. - Pulmonary arteries: PA peak pressure: 41 mm Hg (S).    ASSESSMENT:    1. Lower leg edema   2. OSA on CPAP   3. Essential hypertension   4. Hyperlipidemia, unspecified hyperlipidemia type   5. Controlled type 2 diabetes mellitus without complication, without long-term current use of insulin (HCC)      PLAN:  In order of problems listed above:  1. Lower extremity edema: Right worse than left.  I will increase torsemide to 30 mg daily.  Obtain 1 week basic metabolic panel.  We will also obtain venous Doppler on the right lower extremity to rule out DVT  2. Hypertension: Blood pressure borderline elevated, continue on the current therapy, increase torsemide to 30 mg daily  3. Hyperlipidemia: On Crestor 40 mg daily  4. DM2: Managed by primary care provider   Medication Adjustments/Labs and Tests Ordered: Current medicines are reviewed at length with the patient today.  Concerns regarding medicines are outlined above.  Medication changes, Labs and Tests ordered today are listed in the Patient Instructions below. Patient Instructions  Medication Instructions:  Increase Torsemide to 30 mg daily (take 1 1/2 tablet of 20 mg) If you need  a refill on your cardiac medications before your next appointment, please call your pharmacy.   Lab work: BMET in one week If you have labs (blood work) drawn today and your tests are completely normal, you will receive your results only  by: Marland Kitchen MyChart Message (if you have MyChart) OR . A paper copy in the mail If you have any lab test that is abnormal or we need to change your treatment, we will call you to review the results.  Testing/Procedures: Your physician has requested that you have a lower extremity venous duplex. This test is an ultrasound of the veins in the legs or arms. It looks at venous blood flow that carries blood from the heart to the legs or arms. Allow one hour for a Lower Venous exam. Allow thirty minutes for an Upper Venous exam. There are no restrictions or special instructions.   Follow-Up: At [facility], you and your health needs are our priority.  As part of our continuing mission to provide you with exceptional heart care, we have created designated Provider Care Teams.  These Care Teams include your primary Cardiologist (physician) and Advanced Practice Providers (APPs -  Physician Assistants and Nurse Practitioners) who all work together to provide you with the care you need, when you need it. Marland Kitchen Keep appointment with [facility] as scheduled.  Any Other Special Instructions Will Be Listed Below (If Applicable). Bring BP diary to next office visit.  Hilbert Corrigan, [place]  01/23/2018 11:37 PM    [address], [place], [place]  [address] Phone: [phone]; Fax: [phone]

## 2018-01-23 ENCOUNTER — Encounter: Payer: Self-pay | Admitting: Physician Assistant

## 2018-01-24 ENCOUNTER — Ambulatory Visit (HOSPITAL_COMMUNITY)
Admission: RE | Admit: 2018-01-24 | Discharge: 2018-01-24 | Disposition: A | Discharge status: Home / Self Care | Payer: BLUE CROSS/BLUE SHIELD | Source: Ambulatory Visit | Attending: Cardiovascular Disease | Admitting: Cardiovascular Disease

## 2018-01-24 DIAGNOSIS — R600 Localized edema: Secondary | ICD-10-CM | POA: Insufficient documentation

## 2018-01-29 LAB — BASIC METABOLIC PANEL
BUN/Creatinine Ratio: 20 (ref 12–28)
BUN: 39 mg/dL — ABNORMAL HIGH (ref 8–27)
CALCIUM: 9.5 mg/dL (ref 8.7–10.3)
CHLORIDE: 99 mmol/L (ref 96–106)
CO2: 22 mmol/L (ref 20–29)
Creatinine, Ser: 1.99 mg/dL — ABNORMAL HIGH (ref 0.57–1.00)
GFR calc Af Amer: 30 mL/min/{1.73_m2} — ABNORMAL LOW (ref >59)
GFR calc non Af Amer: 26 mL/min/{1.73_m2} — ABNORMAL LOW (ref >59)
Glucose: 150 mg/dL — ABNORMAL HIGH (ref 65–99)
POTASSIUM: 5.2 mmol/L (ref 3.5–5.2)
Sodium: 140 mmol/L (ref 134–144)

## 2018-01-29 LAB — CBC
Hematocrit: 32 % — ABNORMAL LOW (ref 34.0–46.6)
Hemoglobin: 10.4 g/dL — ABNORMAL LOW (ref 11.1–15.9)
MCH: 30.5 pg (ref 26.6–33.0)
MCHC: 32.5 g/dL (ref 31.5–35.7)
MCV: 94 fL (ref 79–97)
PLATELETS: 261 10*3/uL (ref 150–450)
RBC: 3.41 x10E6/uL — ABNORMAL LOW (ref 3.77–5.28)
RDW: 15.6 % — AB (ref 12.3–15.4)
WBC: 9.8 10*3/uL (ref 3.4–10.8)

## 2018-01-29 LAB — IRON,TIBC AND FERRITIN PANEL
FERRITIN: 79 ng/mL (ref 15–150)
IRON SATURATION: 12 % — AB (ref 15–55)
IRON: 51 ug/dL (ref 27–139)
Total Iron Binding Capacity: 436 ug/dL (ref 250–450)
UIBC: 385 ug/dL — AB (ref 118–369)

## 2018-02-08 DIAGNOSIS — M7062 Trochanteric bursitis, left hip: Secondary | ICD-10-CM | POA: Diagnosis not present

## 2018-02-08 DIAGNOSIS — Z79899 Other long term (current) drug therapy: Secondary | ICD-10-CM | POA: Diagnosis not present

## 2018-02-08 DIAGNOSIS — Z6836 Body mass index (BMI) 36.0-36.9, adult: Secondary | ICD-10-CM | POA: Diagnosis not present

## 2018-02-08 DIAGNOSIS — E1144 Type 2 diabetes mellitus with diabetic amyotrophy: Secondary | ICD-10-CM | POA: Diagnosis not present

## 2018-02-08 DIAGNOSIS — M150 Primary generalized (osteo)arthritis: Secondary | ICD-10-CM | POA: Diagnosis not present

## 2018-02-08 DIAGNOSIS — M545 Low back pain: Secondary | ICD-10-CM | POA: Diagnosis not present

## 2018-02-08 DIAGNOSIS — L409 Psoriasis, unspecified: Secondary | ICD-10-CM | POA: Diagnosis not present

## 2018-02-08 DIAGNOSIS — L4059 Other psoriatic arthropathy: Secondary | ICD-10-CM | POA: Diagnosis not present

## 2018-02-08 DIAGNOSIS — M5030 Other cervical disc degeneration, unspecified cervical region: Secondary | ICD-10-CM | POA: Diagnosis not present

## 2018-02-08 DIAGNOSIS — M2550 Pain in unspecified joint: Secondary | ICD-10-CM | POA: Diagnosis not present

## 2018-02-08 DIAGNOSIS — E669 Obesity, unspecified: Secondary | ICD-10-CM | POA: Diagnosis not present

## 2018-02-08 DIAGNOSIS — N183 Chronic kidney disease, stage 3 (moderate): Secondary | ICD-10-CM | POA: Diagnosis not present

## 2018-02-21 DIAGNOSIS — E1169 Type 2 diabetes mellitus with other specified complication: Secondary | ICD-10-CM | POA: Diagnosis not present

## 2018-02-21 DIAGNOSIS — F17200 Nicotine dependence, unspecified, uncomplicated: Secondary | ICD-10-CM | POA: Diagnosis not present

## 2018-02-21 DIAGNOSIS — D500 Iron deficiency anemia secondary to blood loss (chronic): Secondary | ICD-10-CM | POA: Diagnosis not present

## 2018-02-21 DIAGNOSIS — R197 Diarrhea, unspecified: Secondary | ICD-10-CM | POA: Diagnosis not present

## 2018-02-21 DIAGNOSIS — J42 Unspecified chronic bronchitis: Secondary | ICD-10-CM | POA: Diagnosis not present

## 2018-02-21 DIAGNOSIS — E782 Mixed hyperlipidemia: Secondary | ICD-10-CM | POA: Diagnosis not present

## 2018-02-21 DIAGNOSIS — N183 Chronic kidney disease, stage 3 (moderate): Secondary | ICD-10-CM | POA: Diagnosis not present

## 2018-02-21 DIAGNOSIS — K259 Gastric ulcer, unspecified as acute or chronic, without hemorrhage or perforation: Secondary | ICD-10-CM | POA: Diagnosis not present

## 2018-02-21 DIAGNOSIS — I129 Hypertensive chronic kidney disease with stage 1 through stage 4 chronic kidney disease, or unspecified chronic kidney disease: Secondary | ICD-10-CM | POA: Diagnosis not present

## 2018-02-21 DIAGNOSIS — K7581 Nonalcoholic steatohepatitis (NASH): Secondary | ICD-10-CM | POA: Diagnosis not present

## 2018-02-21 DIAGNOSIS — K922 Gastrointestinal hemorrhage, unspecified: Secondary | ICD-10-CM | POA: Diagnosis not present

## 2018-02-22 DIAGNOSIS — R197 Diarrhea, unspecified: Secondary | ICD-10-CM | POA: Diagnosis not present

## 2018-02-28 DIAGNOSIS — Z720 Tobacco use: Secondary | ICD-10-CM | POA: Diagnosis not present

## 2018-02-28 DIAGNOSIS — I129 Hypertensive chronic kidney disease with stage 1 through stage 4 chronic kidney disease, or unspecified chronic kidney disease: Secondary | ICD-10-CM | POA: Diagnosis not present

## 2018-02-28 DIAGNOSIS — N179 Acute kidney failure, unspecified: Secondary | ICD-10-CM | POA: Diagnosis not present

## 2018-02-28 DIAGNOSIS — E1129 Type 2 diabetes mellitus with other diabetic kidney complication: Secondary | ICD-10-CM | POA: Diagnosis not present

## 2018-02-28 DIAGNOSIS — N183 Chronic kidney disease, stage 3 (moderate): Secondary | ICD-10-CM | POA: Diagnosis not present

## 2018-02-28 DIAGNOSIS — L4050 Arthropathic psoriasis, unspecified: Secondary | ICD-10-CM | POA: Diagnosis not present

## 2018-04-03 ENCOUNTER — Ambulatory Visit: Payer: Medicare Other | Admitting: Cardiovascular Disease

## 2018-04-03 ENCOUNTER — Encounter: Payer: Self-pay | Admitting: Cardiovascular Disease

## 2018-04-03 VITALS — BP 156/74 | HR 77 | Ht 68.0 in | Wt 229.8 lb

## 2018-04-03 DIAGNOSIS — E119 Type 2 diabetes mellitus without complications: Secondary | ICD-10-CM

## 2018-04-03 DIAGNOSIS — Z720 Tobacco use: Secondary | ICD-10-CM

## 2018-04-03 DIAGNOSIS — G4733 Obstructive sleep apnea (adult) (pediatric): Secondary | ICD-10-CM

## 2018-04-03 DIAGNOSIS — I10 Essential (primary) hypertension: Secondary | ICD-10-CM

## 2018-04-03 DIAGNOSIS — E782 Mixed hyperlipidemia: Secondary | ICD-10-CM

## 2018-04-03 DIAGNOSIS — Z794 Long term (current) use of insulin: Secondary | ICD-10-CM

## 2018-04-03 DIAGNOSIS — D500 Iron deficiency anemia secondary to blood loss (chronic): Secondary | ICD-10-CM

## 2018-04-03 DIAGNOSIS — Z9989 Dependence on other enabling machines and devices: Secondary | ICD-10-CM

## 2018-04-03 MED ORDER — HYDRALAZINE HCL 50 MG PO TABS: 50.0000 mg | ORAL_TABLET | Freq: Two times a day (BID) | ORAL | 1 refills | Status: DC

## 2018-04-03 NOTE — Progress Notes (Signed)
Cardiology Office Note    Date:  04/05/2018   ID:  Kristy Dougherty, DOB 27-Jun-1950, MRN [id number]  PCP:  [name], MD  Cardiologist:  Kristy Majestic, MD   No chief complaint on file.   History of Present Illness:  Kristy Dougherty is a 68 y.o. female who presents for a 4 month follow-up cardiology evaluation.  Kristy Dougherty has a history of obesity, hypertension, type 2 diabetes mellitus, marked mixed hyperlipidemia with an atherogenic profile, and severe obstructive sleep apnea.  In 2011, she had undergone a cardiac catheterization by Kristy Dougherty and had normal coronary arteries.  In 2011 she was referred for a sleep study and was found to have severe obstructive sleep apnea with an AHI of 68.3 per hour.  She had significant oxygen desaturation to 74% and had loud snoring.  She underwent CPAP titration CPAP pressure of 12 cm water pressure was recommended.  Her DME company used to be [employer], but they have gone out of business.  She has not had supplies in several years.  Her mask is very old and des not have a good seal. She admits to 100% use. She typically goes to bed at 9:30 - 10 pm and wakes up at 5:30 am.  She goes to the bathroom typically one time per night. Kristy Dougherty is her DME company for her CPAP supplies.  She admits to shortness of breath that has increased recently. She continues to smoke and has been smoking for at least 45 years currently up to 1 ppd. She has been on amlodipine 10 mg, lisinopril HCT 20/12.5 but she has been taking this twice a day, and Bystolic 5 mg.  She is diabetic on Levemir, Victoza and Tresiba, and for hyperlipidemia.  She has been taking Vytorin 10/20 in addition to Trilipix135 mg daily.  She has a peripheral neuropathy on gabapentin 300 mg twice a day.  She also has psoriatic arthritis and has been taking methotrexate.  When I  saw her In January 2018 she was hypertensive and bradycardic.  At that time, I reduced her Bystolic and added spironolactone  12.5 mg daily.  She also had lab work done on 03/21/16  lab and she had the results on her phone, which I reviewed with her in detail.  Her GFR was 48.  BUN 20, Cr 1.14.  Potassium 4.6, with the addition of spironolactone.  Hemoglobin A1c was increased at 6.9.  Total cholesterol 194, HDL 38, triglycerides 320, LDL 91, and non-HDL cholesterol 156.  She denies any recent chest pressure.  She denies palpitations.   When I saw her in March 2018  I recommended further increase of spironolactone from 12.5 twice a day with further titration to 25 mg twice a day.  Blood pressure remained elevated.  I discontinued Vytorin in light of her persistent elevation of lipid studies and started her on Crestor 20 mg to take in addition to her try lipids 135 mg and recommended omega-3 fatty acid supplementation.  She underwent follow-up laboratory by Kristy Dougherty on 06/19/2016.  BUN 22, chronic 1.05.  LFTs were normal.  Her total cholesterol was 203, HDL 37, triglycerides had increased to 440 and non-HDL cholesterol was 166.  When I  saw her in October 2018  Continued to smoke three quarters a pack of cigarettes per day.  She states that she has not been very rested with her diet.  She does not get much exercise.  She does have some hip discomfort, which  she blames secondary to Crestor.  She had seen Kristy Dougherty. .  She has been on a reduced dose of amlodipine at just 5 mg due to some prior ankle swelling but she believes the ankle swelling resolved once she increased spironolactone.  Follow-up blood work on 10/25/2016 showed a total cholesterol 160, HDL 33, LDL 66, but triglycerides remain significantly elevated at 436.  Hemoglobin A1c was 7.1.  Calcium was 10.4.  She had stable renal function.  She denies any chest tightness.  She was using  CPAP with 100% compliance.    She was in the hospital overnight on November 07, 2017 with generalized weakness and shortness of breath with exertion.  He had symptomatic anemia with a  hemoglobin of 6.6.  Potassium was 6.5 and creatinine 1.89.  She received 2 units of packed red cell transfusion.  Symptoms improved with red blood cell replacement.  She has a history of peptic ulcer disease.  She was supposed to have endoscopy with Kristy Dougherty of [facility] but apparently she was significant he bradycardic and this was canceled.  She saw Kristy Dougherty back in follow-up on November 26, 2017.  Her Bystolic was decreased to 2.5 mg and he added hydralazine 25 mg twice a day for blood pressure control.  She has now been on iron therapy since her hospitalization.    I last saw her in December 24, 2017.  Her blood pressure was elevated despite taking amlodipine 10 mg, hydralazine 25 mg twice a day, lisinopril HCT 20/12.5, spironolactone 12.5 mg in addition to a reduced dose of Bystolic at 2.5 mg.  She had stopped her furosemide.  She was no longer bradycardic with resting Dougherty in the 60s.  However she continued to have falling and I suggested rather than furosemide a trial of torsemide.  Had seen Kristy Dougherty for follow-up evaluation and on January 21, 2018.  That time he had recommended further titration of torsemide to 30 mg daily to continue to leg swelling.  Lower extremity Doppler study did not reveal any DVT was noted that there was interstitial fluid in the calf.  Since I saw her, she has been evaluated by Kristy Dougherty of nephrology for renal insufficiency.  Her creatinine had risen up to 2.12 but recently improved to 1.48.  She sees Kristy Dougherty for primary care and will be seeing her next week.  Her blood pressure has continued to be elevated.  Unfortunately she is still smoking three quarters of a pack per day.  She admits to 100% CPAP utilization.  She presents for reevaluation.  Past Medical History:  Diagnosis Date  . Diabetes mellitus without complication ([place])   . Hyperlipemia   . Hypertension   . Normal coronary arteries 2011  . Obesity   . OSA on CPAP   . Shingles July 2014    Past  Surgical History:  Procedure Laterality Date  . ABDOMINAL HYSTERECTOMY  1983  . CARDIAC CATHETERIZATION  04/02/2009   Normal coronary arteries  . US ECHOCARDIOGRAPHY  03/22/2009   EF =>55%,trace MR,mild TR & PI    Current Medications: Outpatient Medications Prior to Visit  Medication Sig Dispense Refill  . acetaminophen (TYLENOL) 500 MG tablet Take 500-1,000 mg by mouth every 6 (six) hours as needed (for pain).    Marland Kitchen albuterol (PROVENTIL HFA) 108 (90 Base) MCG/ACT inhaler Inhale 2 puffs into the lungs every 6 (six) hours as needed for wheezing or shortness of breath. 1 Inhaler 0  . amLODipine (NORVASC) 10  MG tablet Take 1 tablet (10 mg total) by mouth daily. 90 tablet 3  . budesonide-formoterol (SYMBICORT) 160-4.5 MCG/ACT inhaler Inhale 2 puffs into the lungs 2 (two) times daily.    . carvedilol (COREG) 3.125 MG tablet Take 3.125 mg by mouth 2 (two) times daily.    . Choline Fenofibrate (FENOFIBRIC ACID) 135 MG CPDR Take 135 mg by mouth daily.    . cyclobenzaprine (FLEXERIL) 5 MG tablet Take 5 mg by mouth 3 (three) times daily as needed for muscle spasms.    Marland Kitchen esomeprazole (NEXIUM) 40 MG capsule Take 40 mg by mouth daily before breakfast.    . FLUoxetine (PROZAC) 20 MG capsule Take 20 mg by mouth daily.    . fluticasone (FLONASE) 50 MCG/ACT nasal spray Place 1-2 sprays into both nostrils daily as needed for allergies or rhinitis.    . folic acid (FOLVITE) 1 MG tablet Take 1 mg by mouth daily.    Marland Kitchen gabapentin (NEURONTIN) 100 MG capsule Take 300 mg by mouth at bedtime.     . methotrexate (RHEUMATREX) 2.5 MG tablet Take 15 mg by mouth every Tuesday. Caution:Chemotherapy. Protect from light.    . nicotine (NICODERM CQ - DOSED IN MG/24 HOURS) 21 mg/24hr patch Place 1 patch (21 mg total) onto the skin daily. 28 patch 0  . rosuvastatin (CRESTOR) 40 MG tablet TAKE 1 TABLET BY MOUTH EVERY DAY (Patient taking differently: Take 40 mg by mouth daily. ) 90 tablet 3  . torsemide (DEMADEX) 20 MG tablet Take  1 1/2 (30 mg) daily (Patient taking differently: Take 1  (20 mg) daily) 90 tablet 3  . traMADol (ULTRAM) 50 MG tablet Take 50 mg by mouth every 6 (six) hours as needed (for pain).     . TRESIBA FLEXTOUCH 200 UNIT/ML SOPN Inject 125 Units into the skin daily before breakfast.     . VASCEPA 1 g CAPS TAKE 2 CAPSULES BY MOUTH 2 (TWO) TIMES DAILY. 360 capsule 3  . hydrALAZINE (APRESOLINE) 25 MG tablet Take 1 tablet (25 mg total) by mouth 2 (two) times daily. 180 tablet 1  . lisinopril-hydrochlorothiazide (PRINZIDE,ZESTORETIC) 20-12.5 MG per tablet Take 1 tablet by mouth 2 (two) times daily.     . metFORMIN (GLUCOPHAGE) 1000 MG tablet Take 1,000 mg by mouth 2 (two) times daily with a meal.    . nebivolol (BYSTOLIC) 2.5 MG tablet Take 1 tablet (2.5 mg total) by mouth daily. 90 tablet 1  . spironolactone (ALDACTONE) 25 MG tablet Take 0.5 tablets (12.5 mg total) by mouth daily. 45 tablet 3   No facility-administered medications prior to visit.      Allergies:   Patient has no known allergies.   Social History   Socioeconomic History  . Marital status: Widowed    Spouse name: Not on file  . Number of children: Not on file  . Years of education: Not on file  . Highest education level: Not on file  Occupational History  . Not on file  Social Needs  . Financial resource strain: Not on file  . Food insecurity:    Worry: Not on file    Inability: Not on file  . Transportation needs:    Medical: Not on file    Non-medical: Not on file  Tobacco Use  . Smoking status: Current Every Day Smoker    Packs/day: 1.00    Years: 40.00    Pack years: 40.00    Types: Cigarettes  . Smokeless tobacco: Never Used  Substance  and Sexual Activity  . Alcohol use: No  . Drug use: No  . Sexual activity: Not on file  Lifestyle  . Physical activity:    Days per week: Not on file    Minutes per session: Not on file  . Stress: Not on file  Relationships  . Social connections:    Talks on phone: Not on file     Gets together: Not on file    Attends religious service: Not on file    Active member of club or organization: Not on file    Attends meetings of clubs or organizations: Not on file    Relationship status: Not on file  Other Topics Concern  . Not on file  Social History Narrative  . Not on file    Additional social history is notable in that her husband passed away after developing sepsis.  She works for [employer] in the [employer].  She is smoking 3/4-1 pack of cigarettes per day and has been smoking for 45 to almost 50 years.  She has been widowed since 2010.  She has 1 daughter age 56 and 5 grandchildren.  Family History:  The patient's family history includes Heart attack in her father; Lung cancer in her mother.  Her mother had lung CA.  Father died in his 82s but had numerous heart problems.  ROS General: Negative; No fevers, chills, or night sweats;  HEENT: Negative; No changes in vision or hearing, sinus congestion, difficulty swallowing Pulmonary: Negative; No cough, wheezing, shortness of breath, hemoptysis Cardiovascular:  See HPI GI: Negative; No nausea, vomiting, diarrhea, or abdominal pain GU: Negative; No dysuria, hematuria, or difficulty voiding Musculoskeletal: Negative; no myalgias, joint pain, or weakness Rheumatologic: Positive for psoriatic arthritis Hematologic/Oncology: Negative; no easy bruising, bleeding Endocrine: Positive for diabetes mellitus Neuro: Negative; no changes in balance, headaches Skin: Negative; No rashes or skin lesions Psychiatric: Negative; No behavioral problems, depression Sleep: Positive for severe obstructive sleep apnea with prior loud snoring, daytime sleepiness, hypersomnolence, no bruxism, restless legs, hypnogognic hallucinations, no cataplexy Other comprehensive 14 point system review is negative.   PHYSICAL EXAM:   VS:  BP (!) 156/74   Dougherty 77   Ht _0  (1.727 m)   Wt 229 lb 12.8 oz (104.2 kg)   BMI  34.94 kg/m     Repeat blood pressure by me 162/78  Wt Readings from Last 3 Encounters:  04/03/18 229 lb 12.8 oz (104.2 kg)  01/21/18 238 lb 3.2 oz (108 kg)  12/24/17 240 lb 6.4 oz (109 kg)    General: Alert, oriented, no distress.  Skin: normal turgor, no rashes, warm and dry HEENT: Normocephalic, atraumatic. Pupils equal round and reactive to light; sclera anicteric; extraocular muscles intact;  Nose without nasal septal hypertrophy Mouth/Parynx benign; Mallinpatti scale 3 Neck: No JVD, no carotid bruits; normal carotid upstroke Lungs: clear to ausculatation and percussion; no wheezing or rales Chest wall: without tenderness to palpitation Heart: PMI not displaced, RRR, s1 s2 normal, 1/6 systolic murmur, no diastolic murmur, no rubs, gallops, thrills, or heaves Abdomen: soft, nontender; no hepatosplenomehaly, BS+; abdominal aorta nontender and not dilated by palpation. Back: no CVA tenderness Pulses 2+ Musculoskeletal: full range of motion, normal strength, no joint deformities Extremities: no clubbing cyanosis or edema, Homan's sign negative  Neurologic: grossly nonfocal; Cranial nerves grossly wnl Psychologic: Normal mood and affect   Studies/Labs Reviewed:   ECG (independently read by me): Sinus rhythm at 77 bpm.  Normal intervals  November  2019 ECG (independently read by me): Sinus rhythm at 67 bpm.  No ectopy.  Normal intervals.  October 2018 ECG (independently read by me): Sinus bradycardia 56 bpm.  Normal intervals.  No ectopy.  June 2018 ECG (independently read by me): Sinus bradycardia 54 bpm.  PR interval 196, QTc interval 398 ms.  No ST segment changes.  March 2018 ECG (independently read by me): Sinus bradycardia 54 bpm.  No ectopy.  Normal intervals.  January 2018 EKG:  EKG is  ordered today.  The ekg independently reviewed by me shows sinus bradycardia at 47 bpm.  QTc interval 398 ms.  PR interval 192 ms.  Recent Labs: BMP Latest Ref Rng & Units 01/29/2018  11/16/2017 11/08/2017  Glucose 65 - 99 mg/dL 150(H) 84 122(H)  BUN 8 - 27 mg/dL 39(H) 35(H) 33(H)  Creatinine 0.57 - 1.00 mg/dL 1.99(H) 1.36(H) 1.40(H)  BUN/Creat Ratio 12 - _0 -  Sodium 134 - 144 mmol/L 140 135 137  Potassium 3.5 - 5.2 mmol/L 5.2 5.3(H) 5.4(H)  Chloride 96 - 106 mmol/L 99 99 105  CO2 20 - 29 mmol/L _1 Calcium 8.7 - 10.3 mg/dL 9.5 9.0 8.9     Hepatic Function Latest Ref Rng & Units 11/07/2017  Total Protein 6.5 - 8.1 g/dL 6.6  Albumin 3.5 - 5.0 g/dL 3.0(L)  AST 15 - 41 U/L 26  ALT 0 - 44 U/L 12  Alk Phosphatase 38 - 126 U/L 73  Total Bilirubin 0.3 - 1.2 mg/dL 0.4    CBC Latest Ref Rng & Units 01/29/2018 11/08/2017 11/07/2017  WBC 3.4 - 10.8 x10E3/uL 9.8 10.3 10.1  Hemoglobin 11.1 - 15.9 g/dL 10.4(L) 8.2(L) 6.6(LL)  Hematocrit 34.0 - 46.6 % 32.0(L) 28.1(L) 23.9(L)  Platelets 150 - 450 x10E3/uL 261 237 267   Lab Results  Component Value Date   MCV 94 01/29/2018   MCV 88.9 11/08/2017   MCV 91.9 11/07/2017   No results found for: TSH No results found for: HGBA1C   BNP No results found for: BNP  ProBNP No results found for: PROBNP   Lipid Panel  No results found for: CHOL, TRIG, HDL, CHOLHDL, VLDL, LDLCALC, LDLDIRECT   RADIOLOGY: No results found.   Additional studies/ records that were reviewed today include:  I reviewed the patient's diagnostic sleep study and CPAP titration studies from April and June 2011.  I reviewed prior medical records.  Also reviewed laboratory from [facility] from last year, which showed a BUN of 23 Cr 1.05.;  Normal LFTs.  I personally review blood work from from 04/18/2016 done at [place].  I reviewed the recent blood work from 06/19/2016 by Kristy Dougherty as noted above.  Reviewed her recent hospitalization and subsequent office visits with Kristy Dougherty, [facility]  ASSESSMENT:    1. Essential hypertension   2. OSA on CPAP   3. Mixed hyperlipidemia   4. Tobacco abuse   5. Type 2 diabetes mellitus without complication,  with long-term current use of insulin ([place])   6. Iron deficiency anemia due to chronic blood loss     PLAN:  Ms Danielly Ackerley Is a 68 year old widowed Caucasian female who has significant cardiovascular comorbidities including hypertension, type 2 diabetes mellitus, mixed hyperlipidemia, with remote triglyceride elevations in excess of 500 and severe obstructive sleep apnea since 2011.  Since that time, she has been on CPAP therapy.  She continues to use CPAP but at times has fallen asleep in a recliner.  She required red blood  cell transfusion for significant anemia with a hemoglobin of 6.6.  She also has renal insufficiency and has been recently followed by Kristy Dougherty.  Creatinine had risen up to 2.12.  Most recent creatinine had improved and when checked by Kristy Dougherty creatinine was 1.48 the BUN of 27.  Blood pressure today is elevated and on repeat by me was 162/78 despite taking amlodipine 10 mg, carvedilol 3.125 mg twice a day and hydralazine 25 mg twice a day.  She continues to take torsemide 20 mg.  I have recommended further titration of hydralazine to 50 mg twice a day which should not adversely affect renal function.  She has a history of significant mixed hyperlipidemia and continues to be on Fino fibrotic acid, as well as the Sepra 2 capsules twice a day and rosuvastatin 20 mg.  Target LDL is less than 70.  Last lipid studies in October 2019 were markedly improved with total cholesterol 127, LDL 66 triglycerides 172.  Her HDL remains low at 26.  She is diabetic on insulin.  She continues to take Prozac for depression.  She admits to 100% CPAP use and even admits now that she uses CPAP if she takes an afternoon nap.  I discussed the importance of smoking cessation.  We discussed weight loss with a BMI of almost 35.  Laboratory in January 29, 2018 showed 12% iron saturation and her ferritin had markedly improved from 7 to 79 with treatment.  She will be seeing Kristy Dougherty next week for her  primary care follow-up.  I will see her in 3 months for reevaluation.   Medication Adjustments/Labs and Tests Ordered: Current medicines are reviewed at length with the patient today.  Concerns regarding medicines are outlined above.  Medication changes, Labs and Tests ordered today are listed in the Patient Instructions below.  Patient Instructions  Medication Instructions:  Increase Hydralazine to 50 mg twice daily. If you need a refill on your cardiac medications before your next appointment, please call your pharmacy.    Follow-Up: At [facility], you and your health needs are our priority.  As part of our continuing mission to provide you with exceptional heart care, we have created designated Provider Care Teams.  These Care Teams include your primary Cardiologist (physician) and Advanced Practice Providers (APPs -  Physician Assistants and Nurse Practitioners) who all work together to provide you with the care you need, when you need it. You will need a follow up appointment in 3 months.  You may see KristyGlori Machnik or one of the following Advanced Practice Providers on your designated Care Team: Kristy Dougherty, [place] . Fabian Sharp, PA-C       Signed, Kristy Majestic, MD, [facility]  04/05/2018 5:34 PM    [facility] [address] [place], [place], [place]  [address] Phone: [phone]

## 2018-04-03 NOTE — Patient Instructions (Signed)
Medication Instructions:  Increase Hydralazine to 50 mg twice daily. If you need a refill on your cardiac medications before your next appointment, please call your pharmacy.    Follow-Up: At [facility], you and your health needs are our priority.  As part of our continuing mission to provide you with exceptional heart care, we have created designated Provider Care Teams.  These Care Teams include your primary Cardiologist (physician) and Advanced Practice Providers (APPs -  Physician Assistants and Nurse Practitioners) who all work together to provide you with the care you need, when you need it. You will need a follow up appointment in 3 months.  You may see Dr.Kelly or one of the following Advanced Practice Providers on your designated Care Team: [name], [place] . Fabian Sharp, PA-C

## 2018-04-05 ENCOUNTER — Encounter: Payer: Self-pay | Admitting: Cardiovascular Disease

## 2018-07-02 ENCOUNTER — Telehealth: Payer: Self-pay | Admitting: Cardiovascular Disease

## 2018-07-02 NOTE — Telephone Encounter (Signed)
Mychart, no smartphone, consent, pre reg complete 07/02/18 AF

## 2018-07-04 ENCOUNTER — Telehealth (INDEPENDENT_AMBULATORY_CARE_PROVIDER_SITE_OTHER): Payer: Medicare Other | Admitting: Cardiovascular Disease

## 2018-07-04 ENCOUNTER — Encounter: Payer: Self-pay | Admitting: Cardiovascular Disease

## 2018-07-04 VITALS — BP 135/62 | HR 52 | Ht 68.0 in | Wt 242.0 lb

## 2018-07-04 DIAGNOSIS — N183 Chronic kidney disease, stage 3 unspecified: Secondary | ICD-10-CM

## 2018-07-04 DIAGNOSIS — G4733 Obstructive sleep apnea (adult) (pediatric): Secondary | ICD-10-CM | POA: Diagnosis not present

## 2018-07-04 DIAGNOSIS — Z720 Tobacco use: Secondary | ICD-10-CM | POA: Diagnosis not present

## 2018-07-04 DIAGNOSIS — I10 Essential (primary) hypertension: Secondary | ICD-10-CM | POA: Diagnosis not present

## 2018-07-04 DIAGNOSIS — E782 Mixed hyperlipidemia: Secondary | ICD-10-CM | POA: Diagnosis not present

## 2018-07-04 DIAGNOSIS — Z6836 Body mass index (BMI) 36.0-36.9, adult: Secondary | ICD-10-CM

## 2018-07-04 DIAGNOSIS — Z9989 Dependence on other enabling machines and devices: Secondary | ICD-10-CM

## 2018-07-04 NOTE — Patient Instructions (Addendum)
Medication Instructions:  The current medical regimen is effective;  continue present plan and medications.  If you need a refill on your cardiac medications before your next appointment, please call your pharmacy.   Follow-Up: At [facility], you and your health needs are our priority.  As part of our continuing mission to provide you with exceptional heart care, we have created designated Provider Care Teams.  These Care Teams include your primary Cardiologist (physician) and Advanced Practice Providers (APPs -  Physician Assistants and Nurse Practitioners) who all work together to provide you with the care you need, when you need it. You will need a follow up appointment in 3-4 months.  Please call our office 2 months in advance to schedule this appointment.  You may see Dr.Kelly (sleep day) or one of the following Advanced Practice Providers on your designated Care Team: [name], [place] . Fabian Sharp, PA-C

## 2018-07-04 NOTE — Progress Notes (Signed)
Virtual Visit via Telephone Note   This visit type was conducted due to national recommendations for restrictions regarding the COVID-19 Pandemic (e.g. social distancing) in an effort to limit this patient's exposure and mitigate transmission in our community.  Due to her co-morbid illnesses, this patient is at least at moderate risk for complications without adequate follow up.  This format is felt to be most appropriate for this patient at this time.  The patient did not have access to video technology/had technical difficulties with video requiring transitioning to audio format only (telephone).  All issues noted in this document were discussed and addressed.  No physical exam could be performed with this format.  Please refer to the patient's chart for her  consent to telehealth for [facility].   Date:  07/04/2018   ID:  Kristy Dougherty, DOB [birth year], MRN [id number]  Patient Location: Home Provider Location: Home  PCP:  [name], MD  Cardiologist:  Shelva Majestic, MD Electrophysiologist:  None   Evaluation Performed:  Follow-Up Visit  Chief Complaint: 40-month follow-up evaluation of hypertension, sleep apnea, and hyperlipidemia  History of Present Illness:    Kristy Dougherty is a 68 y.o. female who has a history of obesity, hypertension, type 2 diabetes mellitus, marked mixed hyperlipidemia with an atherogenic profile, and severe obstructive sleep apnea.  In 2011, she had undergone a cardiac catheterization by Dr. Tina Griffiths and had normal coronary arteries.  In 2011 she was referred for a sleep study and was found to have severe obstructive sleep apnea with an AHI of 68.3 per hour.  She had significant oxygen desaturation to 74% and had loud snoring.  She underwent CPAP titration CPAP pressure of 12 cm water pressure was recommended.  Her DME company used to be [employer], but they have gone out of business.  She has not had supplies in several years.  Her mask is very old and des  not have a good seal. She admits to 100% use. She typically goes to bed at 9:30 - 10 pm and wakes up at 5:30 am.  She goes to the bathroom typically one time per night. [name] is her DME company for her CPAP supplies.  She admits to shortness of breath that has increased recently. She continues to smoke and has been smoking for at least 45 years currently up to 1 ppd. She has been on amlodipine 10 mg, lisinopril HCT 20/12.5 but she has been taking this twice a day, and Bystolic 5 mg.  She is diabetic on Levemir, Victoza and Tresiba, and for hyperlipidemia.  She has been taking Vytorin 10/20 in addition to Trilipix135 mg daily.  She has a peripheral neuropathy on gabapentin 300 mg twice a day.  She also has psoriatic arthritis and has been taking methotrexate.  When I  saw her In January 2018 she was hypertensive and bradycardic.  At that time, I reduced her Bystolic and added spironolactone 12.5 mg daily.  She also had lab work done on 03/21/16  lab and she had the results on her phone, which I reviewed with her in detail.  Her GFR was 48.  BUN 20, Cr 1.14.  Potassium 4.6, with the addition of spironolactone.  Hemoglobin A1c was increased at 6.9.  Total cholesterol 194, HDL 38, triglycerides 320, LDL 91, and non-HDL cholesterol 156.  She denies any recent chest pressure.  She denies palpitations.   When I saw her in March 2018  I recommended further increase of spironolactone from 12.5  twice a day with further titration to 25 mg twice a day.  Blood pressure remained elevated.  I discontinued Vytorin in light of her persistent elevation of lipid studies and started her on Crestor 20 mg to take in addition to her try lipids 135 mg and recommended omega-3 fatty acid supplementation.  She underwent follow-up laboratory by Dr. [name] on 06/19/2016.  BUN 22, chronic 1.05.  LFTs were normal.  Her total cholesterol was 203, HDL 37, triglycerides had increased to 440 and non-HDL cholesterol was 166.  When I   saw her in October 2018  Continued to smoke three quarters a pack of cigarettes per day.  She states that she has not been very rested with her diet.  She does not get much exercise.  She does have some hip discomfort, which she blames secondary to Crestor.  She had seen Dr. [name]. .  She has been on a reduced dose of amlodipine at just 5 mg due to some prior ankle swelling but she believes the ankle swelling resolved once she increased spironolactone.  Follow-up blood work on 10/25/2016 showed a total cholesterol 160, HDL 33, LDL 66, but triglycerides remain significantly elevated at 436.  Hemoglobin A1c was 7.1.  Calcium was 10.4.  She had stable renal function.  She denies any chest tightness.  She was using  CPAP with 100% compliance.    She was in the hospital overnight on November 07, 2017 with generalized weakness and shortness of breath with exertion.  He had symptomatic anemia with a hemoglobin of 6.6.  Potassium was 6.5 and creatinine 1.89.  She received 2 units of packed red cell transfusion.  Symptoms improved with red blood cell replacement.  She has a history of peptic ulcer disease.  She was supposed to have endoscopy with Dr. [name] of [facility] but apparently she was significant he bradycardic and this was canceled.  She saw [name] back in follow-up on November 26, 2017.  Her Bystolic was decreased to 2.5 mg and he added hydralazine 25 mg twice a day for blood pressure control.  She has now been on iron therapy since her hospitalization.    I last saw her in December 24, 2017.  Her blood pressure was elevated despite taking amlodipine 10 mg, hydralazine 25 mg twice a day, lisinopril HCT 20/12.5, spironolactone 12.5 mg in addition to a reduced dose of Bystolic at 2.5 mg.  She had stopped her furosemide.  She was no longer bradycardic with resting pulse in the 60s.  However she continued to have falling and I suggested rather than furosemide a trial of torsemide.  Had seen Toni Amend for  follow-up evaluation and on January 21, 2018.  That time he had recommended further titration of torsemide to 30 mg daily to continue to leg swelling.  Lower extremity Doppler study did not reveal any DVT was noted that there was interstitial fluid in the calf.  She has been evaluated by Dr. [name] of nephrology for renal insufficiency.  Her creatinine had risen up to 2.12 but recently improved to 1.48.  She sees Dr. [name] for primary care and will be seeing her next week.  Her blood pressure has continued to be elevated.  Unfortunately she is still smoking three quarters of a pack per day.  She admits to 100% CPAP utilization.  When I last saw her in March 2020, her blood pressure was elevated at 162/78 despite taking amlodipine 10 mg, carvedilol 3.125 mg twice a day  and hydralazine 25 mg twice a day.  She also was on torsemide 20 mg daily.  I recommended further titration of hydralazine to 50 mg twice a day.  She states with this adjustment her blood pressure had improved.  She had continued to be on fenofibrate, rosuvastatin and the vascepa for her mixed hyperlipidemia.  She is continue to use CPAP with 100% compliance and typically sleeps at least 7-1/2 hours per night.  Recently, she has noticed that her machine is not giving her the proper air.  Her machine is very old and initially was her husband's and may be his oldest 05-11-2006.  Her husband died in 2008-05-10 and she has been using his machine since she was diagnosed in 05-10-09.  She she tells me she had complete set of laboratory by Dr. [name] over 1 month ago.  Renal function had stabilized.  Lipids continue to be good but she did not know the specifics.  She presents for evaluation.  The patient does not have symptoms concerning for COVID-19 infection (fever, chills, cough, or new shortness of breath).    Past Medical History:  Diagnosis Date   Diabetes mellitus without complication ([place])    Hyperlipemia    Hypertension    Normal coronary  arteries 10-May-2009   Obesity    OSA on CPAP    Shingles July 2014   Past Surgical History:  Procedure Laterality Date   ABDOMINAL HYSTERECTOMY  1983   CARDIAC CATHETERIZATION  04/02/2009   Normal coronary arteries   US ECHOCARDIOGRAPHY  03/22/2009   EF =>55%,trace MR,mild TR & PI     Current Meds  Medication Sig   acetaminophen (TYLENOL) 500 MG tablet Take 500-1,000 mg by mouth every 6 (six) hours as needed (for pain).   albuterol (PROVENTIL HFA) 108 (90 Base) MCG/ACT inhaler Inhale 2 puffs into the lungs every 6 (six) hours as needed for wheezing or shortness of breath.   amLODipine (NORVASC) 10 MG tablet Take 1 tablet (10 mg total) by mouth daily.   budesonide-formoterol (SYMBICORT) 160-4.5 MCG/ACT inhaler Inhale 2 puffs into the lungs 2 (two) times daily.   carvedilol (COREG) 3.125 MG tablet Take 3.125 mg by mouth 2 (two) times daily.   cyclobenzaprine (FLEXERIL) 5 MG tablet Take 5 mg by mouth 3 (three) times daily as needed for muscle spasms.   fenofibrate 160 MG tablet Take 160 mg by mouth daily.   FLUoxetine (PROZAC) 20 MG capsule Take 20 mg by mouth daily.   fluticasone (FLONASE) 50 MCG/ACT nasal spray Place 1-2 sprays into both nostrils daily as needed for allergies or rhinitis.   folic acid (FOLVITE) 1 MG tablet Take 1 mg by mouth daily.   gabapentin (NEURONTIN) 100 MG capsule Take 300 mg by mouth at bedtime.    hydrALAZINE (APRESOLINE) 50 MG tablet Take 1 tablet (50 mg total) by mouth 2 (two) times daily.   methotrexate (RHEUMATREX) 2.5 MG tablet Take 15 mg by mouth every Tuesday. Caution:Chemotherapy. Protect from light.   omeprazole (PRILOSEC) 40 MG capsule Take 1 capsule by mouth daily.   rosuvastatin (CRESTOR) 40 MG tablet TAKE 1 TABLET BY MOUTH EVERY DAY (Patient taking differently: Take 40 mg by mouth daily. )   torsemide (DEMADEX) 20 MG tablet Take 20 mg by mouth daily.   traMADol (ULTRAM) 50 MG tablet Take 50 mg by mouth every 6 (six) hours as needed  (for pain).    TRESIBA FLEXTOUCH 200 UNIT/ML SOPN Inject 125 Units into the skin daily before  breakfast.    VASCEPA 1 g CAPS TAKE 2 CAPSULES BY MOUTH 2 (TWO) TIMES DAILY.   [DISCONTINUED] Choline Fenofibrate (FENOFIBRIC ACID) 135 MG CPDR Take 135 mg by mouth daily.     Allergies:   Patient has no known allergies.   Social History   Tobacco Use   Smoking status: Current Every Day Smoker    Packs/day: 1.00    Years: 40.00    Pack years: 40.00    Types: Cigarettes   Smokeless tobacco: Never Used  Substance Use Topics   Alcohol use: No   Drug use: No     Family Hx: The patient's family history includes Heart attack in her father; Lung cancer in her mother.  ROS:   Please see the history of present illness.     General: Negative; No fevers, chills, or night sweats;  HEENT: Negative; No changes in vision or hearing, sinus congestion, difficulty swallowing Pulmonary: Negative; No cough, wheezing, shortness of breath, hemoptysis Cardiovascular: see HPI GI: Negative; No nausea, vomiting, diarrhea, or abdominal pain GU: Negative; No dysuria, hematuria, or difficulty voiding Musculoskeletal: Positive for rheumatoid arthritis Hematologic/Oncology: Negative; no easy bruising, bleeding Endocrine: Negative; no heat/cold intolerance; no diabetes Neuro: Negative; no changes in balance, headaches Skin: Negative; No rashes or skin lesions Psychiatric: Negative; No behavioral problems, depression Sleep: Positive for severe OSA on CPAP therapy.  Her machine is old and dates back to approximately 2008; it seems to not be functioning as well as it had in the past; no hypersomnolence, bruxism, restless legs, hypnogognic hallucinations, no cataplexy Other comprehensive 14 point system review is negative.  All other systems reviewed and are negative.   Prior CV studies:   The following studies were reviewed today:  LE Venous Doppler 01/24/2018 Summary: Right: No evidence of deep vein  thrombosis in the lower extremity. No indirect evidence of obstruction proximal to the inguinal ligament. Interstitial fluid in calf.   Left: No evidence of common femoral vein obstruction.     Labs/Other Tests and Data Reviewed:    EKG:  An ECG dated 04/03/2018 was personally reviewed today and demonstrated:  Sinus rhythm at 77 bpm.  Normal intervals  Recent Labs: 11/07/2017: ALT 12 01/29/2018: BUN 39; Creatinine, Ser 1.99; Hemoglobin 10.4; Platelets 261; Potassium 5.2; Sodium 140   Recent Lipid Panel No results found for: CHOL, TRIG, HDL, CHOLHDL, LDLCALC, LDLDIRECT  Wt Readings from Last 3 Encounters:  07/04/18 242 lb (109.8 kg)  04/03/18 229 lb 12.8 oz (104.2 kg)  01/21/18 238 lb 3.2 oz (108 kg)     Objective:    Vital Signs:  BP 135/62    Pulse (!) 52    Ht 5\' 8"  (1.727 m)    Wt 242 lb (109.8 kg)    BMI 36.80 kg/m    This was a phone visit and I could not visually inspect the patient According the patient her physical appearance is unchanged from 3 months previously However, there is weight gain from 229 up to 242 Breathing is unlabored and normal There is no audible wheezing She denies any significant swelling in her legs or neck vein distention She is unaware of any chest wall tenderness or abdominal tenderness She denies any awareness of palpitations.  Her pulse is bradycardic today and regular by palpation She has a history of psoriatic arthritis She denies any neurologic changes She has normal cognition, mood and affect  ASSESSMENT & PLAN:    1. Essential Hypertension: Blood pressure is improved today on a  regimen now consisting of hydralazine 50 mg twice a day, carvedilol 3.125 mg twice a day, amlodipine 10 mg, and torsemide 20 mg daily.  She denies any awareness of leg swelling.  2.  OSA on CPAP therapy: She was initially diagnosed with severe sleep apnea in Apr 24, 2009 with an AHI of 68.3/h and significant oxygen desaturation to 74%.  She had loud snoring.  She was  titrated up to 12 cm water pressure.  Her initial DME company was [employer] but recently she has been using [employer].  Her machine is actually from 2006-04-25 and was the machine that her husband had used before his death in April 24, 2008.  Presently she is noticed that the machine is not functioning quite as well as it had in the past.  She admits to 100% compliance and is sleeping typically 7-1/2 hours per night.  I will brought [place] her to have a new [employer]Med AirSense 10 CPAP auto unit which she should qualify for.  If a download is necessary this will need to be obtained from her card via [place].  With her 100% compliance she should qualify.  I discussed with her the importance of weight loss.  She has not been as active doing this COVID-19 pandemic and stay at home orders.  3.  Mixed hyperlipidemia: She is now on Vascepa 2 capsules twice a day in addition to rosuvastatin now at 40 mg daily, which both have outcome benefit data.  I will try to obtain recent laboratory completed by Dr. [name].  4. Obesity: Since her last office visit, there has been weight gain over the past 3 months.  BMI is 36.8.  We discussed the implications of weight gain particularly with reference to her hypertension as well as her sleep apnea.  I discussed trying to walk at least 5 days/week for minimum of 30 minutes.  5. Tobacco abuse: Cessation is essential  6.  Chronic kidney disease: She is now followed by Dr. [name].  Creatinine had Increased to 2.12 and had improved to 1.48.  Recent laboratory was obtained.  I will try to obtain these results.   COVID-19 Education: The signs and symptoms of COVID-19 were discussed with the patient and how to seek care for testing (follow up with PCP or arrange E-visit).  The importance of social distancing was discussed today.  Time:   Today, I have spent 25 minutes with the patient with telehealth technology discussing the above problems.     Medication Adjustments/Labs and Tests Ordered: Current  medicines are reviewed at length with the patient today.  Concerns regarding medicines are outlined above.   Tests Ordered: No orders of the defined types were placed in this encounter.   Medication Changes: No orders of the defined types were placed in this encounter.   Disposition:  Follow up after she receives a new CPAP machine with follow-up within 3 months subsequently  Signed, Shelva Majestic, MD  07/04/2018 8:48 AM    [place]

## 2018-07-31 NOTE — Telephone Encounter (Signed)
Open n error °

## 2018-09-12 ENCOUNTER — Other Ambulatory Visit: Payer: Self-pay | Admitting: Cardiovascular Disease

## 2018-10-11 ENCOUNTER — Other Ambulatory Visit: Payer: Self-pay | Admitting: Cardiovascular Disease

## 2018-10-14 ENCOUNTER — Telehealth: Payer: Self-pay

## 2018-10-14 NOTE — Telephone Encounter (Signed)
Called patient, advised that appointment is an in office visit-  Patient verbalized understanding, will come to appointment.  No issues with covid or symptoms, aware to wear a mask.

## 2018-10-15 ENCOUNTER — Encounter: Payer: Self-pay | Admitting: Cardiovascular Disease

## 2018-10-15 ENCOUNTER — Other Ambulatory Visit: Payer: Self-pay

## 2018-10-15 ENCOUNTER — Ambulatory Visit (INDEPENDENT_AMBULATORY_CARE_PROVIDER_SITE_OTHER): Payer: Medicare Other | Admitting: Cardiovascular Disease

## 2018-10-15 VITALS — BP 142/60 | HR 59 | Ht 68.0 in | Wt 221.4 lb

## 2018-10-15 DIAGNOSIS — Z9989 Dependence on other enabling machines and devices: Secondary | ICD-10-CM

## 2018-10-15 DIAGNOSIS — Z6833 Body mass index (BMI) 33.0-33.9, adult: Secondary | ICD-10-CM

## 2018-10-15 DIAGNOSIS — G4733 Obstructive sleep apnea (adult) (pediatric): Secondary | ICD-10-CM | POA: Diagnosis not present

## 2018-10-15 DIAGNOSIS — N183 Chronic kidney disease, stage 3 unspecified: Secondary | ICD-10-CM

## 2018-10-15 DIAGNOSIS — I10 Essential (primary) hypertension: Secondary | ICD-10-CM | POA: Diagnosis not present

## 2018-10-15 DIAGNOSIS — E782 Mixed hyperlipidemia: Secondary | ICD-10-CM | POA: Diagnosis not present

## 2018-10-15 DIAGNOSIS — E6609 Other obesity due to excess calories: Secondary | ICD-10-CM

## 2018-10-15 MED ORDER — HYDRALAZINE HCL 50 MG PO TABS: 75.0000 mg | ORAL_TABLET | Freq: Two times a day (BID) | ORAL | 1 refills | Status: DC

## 2018-10-15 NOTE — Progress Notes (Signed)
Cardiology Office Note    Date:  10/15/2018   ID:  Kristy Dougherty, DOB 02-06-1950, MRN [id number]  PCP:  [name], MD  Cardiologist:  Shelva Majestic, MD   No chief complaint on file.   History of Present Illness:  Kristy Dougherty is a 68 y.o. female who presents for a 3 month follow-up cardiology evaluation.  Kristy Dougherty has a history of obesity, hypertension, type 2 diabetes mellitus, marked mixed hyperlipidemia with an atherogenic profile, and severe obstructive sleep apnea.  In 2011, she had undergone a cardiac catheterization by Dr. Tina Griffiths and had normal coronary arteries.  In 2011 she was referred for a sleep study and was found to have severe obstructive sleep apnea with an AHI of 68.3 per hour.  She had significant oxygen desaturation to 74% and had loud snoring.  She underwent CPAP titration CPAP pressure of 12 cm water pressure was recommended.  Her DME company used to be [employer], but they have gone out of business.  She has not had supplies in several years.  Her mask is very old and des not have a good seal. She admits to 100% use. She typically goes to bed at 9:30 - 10 pm and wakes up at 5:30 am.  She goes to the bathroom typically one time per night. [name] is her DME company for her CPAP supplies.  She admits to shortness of breath that has increased recently. She continues to smoke and has been smoking for at least 45 years currently up to 1 ppd. She has been on amlodipine 10 mg, lisinopril HCT 20/12.5 but she has been taking this twice a day, and Bystolic 5 mg.  She is diabetic on Levemir, Victoza and Tresiba, and for hyperlipidemia.  She has been taking Vytorin 10/20 in addition to Trilipix135 mg daily.  She has a peripheral neuropathy on gabapentin 300 mg twice a day.  She also has psoriatic arthritis and has been taking methotrexate.  When I  saw her In January 2018 she was hypertensive and bradycardic.  At that time, I reduced her Bystolic and added spironolactone  12.5 mg daily.  She also had lab work done on 03/21/16  lab and she had the results on her phone, which I reviewed with her in detail.  Her GFR was 48.  BUN 20, Cr 1.14.  Potassium 4.6, with the addition of spironolactone.  Hemoglobin A1c was increased at 6.9.  Total cholesterol 194, HDL 38, triglycerides 320, LDL 91, and non-HDL cholesterol 156.  She denies any recent chest pressure.  She denies palpitations.   When I saw her in March 2018  I recommended further increase of spironolactone from 12.5 twice a day with further titration to 25 mg twice a day.  Blood pressure remained elevated.  I discontinued Vytorin in light of her persistent elevation of lipid studies and started her on Crestor 20 mg to take in addition to her try lipids 135 mg and recommended omega-3 fatty acid supplementation.  She underwent follow-up laboratory by Dr. [name] on 06/19/2016.  BUN 22, chronic 1.05.  LFTs were normal.  Her total cholesterol was 203, HDL 37, triglycerides had increased to 440 and non-HDL cholesterol was 166.  When I  saw her in October 2018  Continued to smoke three quarters a pack of cigarettes per day.  She states that she has not been very rested with her diet.  She does not get much exercise.  She does have some hip discomfort, which  she blames secondary to Crestor.  She had seen Dr. [name]. .  She has been on a reduced dose of amlodipine at just 5 mg due to some prior ankle swelling but she believes the ankle swelling resolved once she increased spironolactone.  Follow-up blood work on 10/25/2016 showed a total cholesterol 160, HDL 33, LDL 66, but triglycerides remain significantly elevated at 436.  Hemoglobin A1c was 7.1.  Calcium was 10.4.  She had stable renal function.  She denies any chest tightness.  She was using  CPAP with 100% compliance.    She was in the hospital overnight on November 07, 2017 with generalized weakness and shortness of breath with exertion.  He had symptomatic anemia with a  hemoglobin of 6.6.  Potassium was 6.5 and creatinine 1.89.  She received 2 units of packed red cell transfusion.  Symptoms improved with red blood cell replacement.  She has a history of peptic ulcer disease.  She was supposed to have endoscopy with Dr. [name] of [facility] but apparently she was significant he bradycardic and this was canceled.  She saw [name] back in follow-up on November 26, 2017.  Her Bystolic was decreased to 2.5 mg and he added hydralazine 25 mg twice a day for blood pressure control.  She has now been on iron therapy since her hospitalization.    I last saw her in December 24, 2017.  Her blood pressure was elevated despite taking amlodipine 10 mg, hydralazine 25 mg twice a day, lisinopril HCT 20/12.5, spironolactone 12.5 mg in addition to a reduced dose of Bystolic at 2.5 mg.  She had stopped her furosemide.  She was no longer bradycardic with resting pulse in the 60s.  However she continued to have falling and I suggested rather than furosemide a trial of torsemide.  She had seen [name], [place] for follow-up evaluation and on January 21, 2018.  That time he had recommended further titration of torsemide to 30 mg daily to continue to leg swelling.  Lower extremity Doppler study did not reveal any DVT was noted that there was interstitial fluid in the calf.  She has been evaluated by Dr. [name] of nephrology for renal insufficiency.  Her creatinine had risen up to 2.12 but recently improved to 1.48.  She sees Dr. [name] for primary care and will be seeing her next week.  Her blood pressure has continued to be elevated.  Unfortunately she is still smoking three quarters of a pack per day.  She admits to 100% CPAP utilization.    When I last saw her in March 2020, her blood pressure was elevated at 162/78 despite taking amlodipine 10 mg, carvedilol 3.125 mg twice a day and hydralazine 25 mg twice a day.  She also was on torsemide 20 mg daily.  I recommended further titration of hydralazine  to 50 mg twice a day.  She states with this adjustment her blood pressure had improved.    She was last evaluated by me on July 04, 2018 and a telemedicine visit.  Her blood pressure was improved on a regimen consisting of hydralazine 50 mg twice a day, carvedilol 3.125 mg twice a day, amlodipine 10 mg and torsemide 20 mg.  She had continued to be on fenofibrate, rosuvastatin and the vascepa for her mixed hyperlipidemia.  She was continuing to use CPAP with 100% compliance and typically sleeps at least 7-1/2 hours per night.    Her machine was beginning to malfunction and she did not feel  that she was getting adequate air.  Her machine was old and initially was her husband's and may be his oldest 2006-04-29.  Her husband died in 04/28/2008 and she has been using his machine since she was diagnosed in April 28, 2009.   During that evaluation, I recommended that she be contacted by [facility] and obtain a new ResMed air sense 10 CPAP auto unit.  However, she had received the messages but was concerned about the call and therefore never responded and therefore is still using her machine over 71 years old.  Presently she denies any chest pain.  Her blood pressure has tended to be increased.  She presents for evaluation.   Past Medical History:  Diagnosis Date   Diabetes mellitus without complication ([place])    Hyperlipemia    Hypertension    Normal coronary arteries Apr 28, 2009   Obesity    OSA on CPAP    Shingles July 2014    Past Surgical History:  Procedure Laterality Date   ABDOMINAL HYSTERECTOMY  28-Apr-1981   CARDIAC CATHETERIZATION  04/02/2009   Normal coronary arteries   US ECHOCARDIOGRAPHY  03/22/2009   EF =>55%,trace MR,mild TR & PI    Current Medications: Outpatient Medications Prior to Visit  Medication Sig Dispense Refill   acetaminophen (TYLENOL) 500 MG tablet Take 500-1,000 mg by mouth every 6 (six) hours as needed (for pain).     albuterol (PROVENTIL HFA) 108 (90 Base) MCG/ACT inhaler Inhale 2  puffs into the lungs every 6 (six) hours as needed for wheezing or shortness of breath. 1 Inhaler 0   amLODipine (NORVASC) 10 MG tablet Take 1 tablet (10 mg total) by mouth daily. 90 tablet 3   budesonide-formoterol (SYMBICORT) 160-4.5 MCG/ACT inhaler Inhale 2 puffs into the lungs 2 (two) times daily.     carvedilol (COREG) 3.125 MG tablet Take 3.125 mg by mouth 2 (two) times daily.     cyclobenzaprine (FLEXERIL) 5 MG tablet Take 5 mg by mouth 3 (three) times daily as needed for muscle spasms.     fenofibrate 160 MG tablet Take 160 mg by mouth daily.     FLUoxetine (PROZAC) 20 MG capsule Take 20 mg by mouth daily.     fluticasone (FLONASE) 50 MCG/ACT nasal spray Place 1-2 sprays into both nostrils daily as needed for allergies or rhinitis.     folic acid (FOLVITE) 1 MG tablet Take 1 mg by mouth daily.     gabapentin (NEURONTIN) 100 MG capsule Take 300 mg by mouth at bedtime.      methotrexate (RHEUMATREX) 2.5 MG tablet Take 15 mg by mouth every Tuesday. Caution:Chemotherapy. Protect from light.     omeprazole (PRILOSEC) 40 MG capsule Take 1 capsule by mouth daily.     rosuvastatin (CRESTOR) 40 MG tablet TAKE 1 TABLET BY MOUTH EVERY DAY 90 tablet 3   torsemide (DEMADEX) 20 MG tablet Take 20 mg by mouth daily.     traMADol (ULTRAM) 50 MG tablet Take 50 mg by mouth every 6 (six) hours as needed (for pain).      TRESIBA FLEXTOUCH 200 UNIT/ML SOPN Inject 125 Units into the skin daily before breakfast.      hydrALAZINE (APRESOLINE) 50 MG tablet TAKE 1 TABLET BY MOUTH TWICE A DAY 180 tablet 1   VASCEPA 1 g CAPS TAKE 2 CAPSULES BY MOUTH 2 (TWO) TIMES DAILY. 360 capsule 3   No facility-administered medications prior to visit.      Allergies:   Patient has no known  allergies.   Social History   Socioeconomic History   Marital status: Widowed    Spouse name: Not on file   Number of children: Not on file   Years of education: Not on file   Highest education level: Not on file    Occupational History   Not on file  Social Needs   Financial resource strain: Not on file   Food insecurity    Worry: Not on file    Inability: Not on file   Transportation needs    Medical: Not on file    Non-medical: Not on file  Tobacco Use   Smoking status: Current Every Day Smoker    Packs/day: 1.00    Years: 40.00    Pack years: 40.00    Types: Cigarettes   Smokeless tobacco: Never Used  Substance and Sexual Activity   Alcohol use: No   Drug use: No   Sexual activity: Not on file  Lifestyle   Physical activity    Days per week: Not on file    Minutes per session: Not on file   Stress: Not on file  Relationships   Social connections    Talks on phone: Not on file    Gets together: Not on file    Attends religious service: Not on file    Active member of club or organization: Not on file    Attends meetings of clubs or organizations: Not on file    Relationship status: Not on file  Other Topics Concern   Not on file  Social History Narrative   Not on file    Additional social history is notable in that her husband passed away after developing sepsis.  She works for [employer] in the [employer].  She is smoking 3/4-1 pack of cigarettes per day and has been smoking for 45 to almost 50 years.  She has been widowed since 2010.  She has 1 daughter age 24 and 5 grandchildren.  Family History:  The patient's family history includes Heart attack in her father; Lung cancer in her mother.  Her mother had lung CA.  Father died in his 5s but had numerous heart problems.  ROS General: Negative; No fevers, chills, or night sweats;  HEENT: Negative; No changes in vision or hearing, sinus congestion, difficulty swallowing Pulmonary: Negative; No cough, wheezing, shortness of breath, hemoptysis Cardiovascular:  See HPI GI: Negative; No nausea, vomiting, diarrhea, or abdominal pain GU: Negative; No dysuria, hematuria, or difficulty  voiding Musculoskeletal: Negative; no myalgias, joint pain, or weakness Rheumatologic: Positive for psoriatic arthritis Hematologic/Oncology: Negative; no easy bruising, bleeding Endocrine: Positive for diabetes mellitus Neuro: Negative; no changes in balance, headaches Skin: Negative; No rashes or skin lesions Psychiatric: Negative; No behavioral problems, depression Sleep: Positive for severe obstructive sleep apnea with prior loud snoring, daytime sleepiness, hypersomnolence, no bruxism, restless legs, hypnogognic hallucinations, no cataplexy Other comprehensive 14 point system review is negative.   PHYSICAL EXAM:   VS:  BP (!) 142/60    Pulse (!) 59    Ht _0  (1.727 m)    Wt 221 lb 6.4 oz (100.4 kg)    BMI 33.66 kg/m     Repeat blood pressure by me was 144/64  Wt Readings from Last 3 Encounters:  10/15/18 221 lb 6.4 oz (100.4 kg)  07/04/18 242 lb (109.8 kg)  04/03/18 229 lb 12.8 oz (104.2 kg)    General: Alert, oriented, no distress.  Skin: Psoriasis HEENT: Normocephalic, atraumatic. Pupils  equal round and reactive to light; sclera anicteric; extraocular muscles intact;  Nose without nasal septal hypertrophy Mouth/Parynx benign; Mallinpatti scale 3 Neck: No JVD, no carotid bruits; normal carotid upstroke Lungs: clear to ausculatation and percussion; no wheezing or rales Chest wall: without tenderness to palpitation Heart: PMI not displaced, RRR, s1 s2 normal, 1/6 systolic murmur, no diastolic murmur, no rubs, gallops, thrills, or heaves Abdomen: soft, nontender; no hepatosplenomehaly, BS+; abdominal aorta nontender and not dilated by palpation. Back: no CVA tenderness Pulses 2+ Musculoskeletal: full range of motion, normal strength, no joint deformities Extremities: no clubbing cyanosis or edema, Homan's sign negative  Neurologic: grossly nonfocal; Cranial nerves grossly wnl Psychologic: Normal mood and affect       Studies/Labs Reviewed:   ECG (independently read  by me): Sinus rhythm at 77 bpm.  Normal intervals  November 2019 ECG (independently read by me): Sinus rhythm at 67 bpm.  No ectopy.  Normal intervals.  October 2018 ECG (independently read by me): Sinus bradycardia 56 bpm.  Normal intervals.  No ectopy.  June 2018 ECG (independently read by me): Sinus bradycardia 54 bpm.  PR interval 196, QTc interval 398 ms.  No ST segment changes.  March 2018 ECG (independently read by me): Sinus bradycardia 54 bpm.  No ectopy.  Normal intervals.  January 2018 EKG:  EKG is  ordered today.  The ekg independently reviewed by me shows sinus bradycardia at 47 bpm.  QTc interval 398 ms.  PR interval 192 ms.  Recent Labs: BMP Latest Ref Rng & Units 01/29/2018 11/16/2017 11/08/2017  Glucose 65 - 99 mg/dL 150(H) 84 122(H)  BUN 8 - 27 mg/dL 39(H) 35(H) 33(H)  Creatinine 0.57 - 1.00 mg/dL 1.99(H) 1.36(H) 1.40(H)  BUN/Creat Ratio 12 - _0 -  Sodium 134 - 144 mmol/L 140 135 137  Potassium 3.5 - 5.2 mmol/L 5.2 5.3(H) 5.4(H)  Chloride 96 - 106 mmol/L 99 99 105  CO2 20 - 29 mmol/L _1 Calcium 8.7 - 10.3 mg/dL 9.5 9.0 8.9     Hepatic Function Latest Ref Rng & Units 11/07/2017  Total Protein 6.5 - 8.1 g/dL 6.6  Albumin 3.5 - 5.0 g/dL 3.0(L)  AST 15 - 41 U/L 26  ALT 0 - 44 U/L 12  Alk Phosphatase 38 - 126 U/L 73  Total Bilirubin 0.3 - 1.2 mg/dL 0.4    CBC Latest Ref Rng & Units 01/29/2018 11/08/2017 11/07/2017  WBC 3.4 - 10.8 x10E3/uL 9.8 10.3 10.1  Hemoglobin 11.1 - 15.9 g/dL 10.4(L) 8.2(L) 6.6(LL)  Hematocrit 34.0 - 46.6 % 32.0(L) 28.1(L) 23.9(L)  Platelets 150 - 450 x10E3/uL 261 237 267   Lab Results  Component Value Date   MCV 94 01/29/2018   MCV 88.9 11/08/2017   MCV 91.9 11/07/2017   No results found for: TSH No results found for: HGBA1C   BNP No results found for: BNP  ProBNP No results found for: PROBNP   Lipid Panel  No results found for: CHOL, TRIG, HDL, CHOLHDL, VLDL, LDLCALC, LDLDIRECT   RADIOLOGY: No results  found.   Additional studies/ records that were reviewed today include:  I reviewed the patient's diagnostic sleep study and CPAP titration studies from April and June 2011.  I reviewed prior medical records.  Also reviewed laboratory from [facility] from last year, which showed a BUN of 23 Cr 1.05.;  Normal LFTs.  I personally review blood work from from 04/18/2016 done at [place].  I reviewed the recent blood work  by Dr. [name]  Reviewed her recent hospitalization and subsequent office visits with [name], [facility]  ASSESSMENT:    1. Essential hypertension   2. OSA on CPAP   3. Mixed hyperlipidemia   4. CKD (chronic kidney disease) stage 3, GFR 30-59 ml/min (HCC)   5. Class 1 obesity due to excess calories with serious comorbidity and body mass index (BMI) of 33.0 to 33.9 in adult     PLAN:  Ms Lianny Molter Is a 68 year old widowed Caucasian female who has significant cardiovascular comorbidities including hypertension, type 2 diabetes mellitus, mixed hyperlipidemia, with remote triglyceride elevations in excess of 500 and severe obstructive sleep apnea since 2011.  Her blood pressure today remains elevated despite taking carvedilol 3.125 mg twice a day, amlodipine 10 mg, hydralazine 50 mg twice a day and torsemide 20 mg daily.  Her pulse was in the 50s therefore precluding further increase in beta-blocker therapy.  I discussed with her target blood pressure less than 130/80 with ideal blood pressure less than 120/80.  I am recommending further titration of hydralazine initially to 75 mg twice a day.  She will monitor her blood pressure and if she does not reach target further dose adjustment will be necessary.  She had been on rosuvastatin 40 mg, fenofibrate 160 mg in addition to the Vascepa but apparently due to cost sift discontinued for Vascepa.  She continues to use CPAP with 100% compliance.  Typically she goes to bed at 11 PM and wakes up at 6:30 AM.  Her husband's machine is greater than 35  years old and has begun to malfunction.  Anderson Malta was here today in a sleep clinic and we reinitiated the process for her to get a new ResMed air sense 10 CPAP auto unit.  I again discussed the importance of weight loss and increased exercise.  BMI is 33.66.  We discussed the importance of walking at least 5 days/week for minimum of 30 minutes.  She continues to be followed by Dr. [name] for her chronic kidney disease I will see her within 3 months following receiving her new CPAP unit per Medicare requirements.    Since that time, she has been on CPAP therapy.  She continues to use CPAP but at times has fallen asleep in a recliner.  She required red blood cell transfusion for significant anemia with a hemoglobin of 6.6.  She also has renal insufficiency and has been recently followed by Dr. [name].  Creatinine had risen up to 2.12.  Most recent creatinine had improved and when checked by Dr. [name] creatinine was 1.48 the BUN of 27.  Blood pressure today is elevated and on repeat by me was 162/78 despite taking amlodipine 10 mg, carvedilol 3.125 mg twice a day and hydralazine 25 mg twice a day.  She continues to take torsemide 20 mg.  I have recommended further titration of hydralazine to 50 mg twice a day which should not adversely affect renal function.  She has a history of significant mixed hyperlipidemia and continues to be on Fino fibrotic acid, as well as the Sepra 2 capsules twice a day and rosuvastatin 20 mg.  Target LDL is less than 70.  Last lipid studies in October 2019 were markedly improved with total cholesterol 127, LDL 66 triglycerides 172.  Her HDL remains low at 26.  She is diabetic on insulin.  She continues to take Prozac for depression.  She admits to 100% CPAP use and even admits now that she uses CPAP  if she takes an afternoon nap.  I discussed the importance of smoking cessation.  We discussed weight loss with a BMI of almost 35.  Laboratory in January 29, 2018 showed 12% iron  saturation and her ferritin had markedly improved from 7 to 79 with treatment.  She will be seeing Dr. [name] next week for her primary care follow-up.  I will see her in 3 months for reevaluation.   Medication Adjustments/Labs and Tests Ordered: Current medicines are reviewed at length with the patient today.  Concerns regarding medicines are outlined above.  Medication changes, Labs and Tests ordered today are listed in the Patient Instructions below.  Patient Instructions  Follow-Up: You will need a follow up appointment in 3 months.  You may see Shelva Majestic, MD or one of the following Advanced Practice Providers on your designated Care Team: [name], PA-C Fabian Sharp, [place]       Medication Instructions:  INCREASE HYDRALAZINE 75MG TWICE DAILY THIS WILL BE 1-1/2 TABLETS TWICE DAILY. The current medical regimen is effective;  continue present plan and medications as directed. Please refer to the Current Medication list given to you today. If you need a refill on your cardiac medications before your next appointment, please call your pharmacy. Labwork: When you have labs (blood work) and your tests are completely normal, you will receive your results ONLY by [place] (if you have MyChart) -OR- A paper copy in the mail.  At [facility], you and your health needs are our priority.  As part of our continuing mission to provide you with exceptional heart care, we have created designated Provider Care Teams.  These Care Teams include your primary Cardiologist (physician) and Advanced Practice Providers (APPs -  Physician Assistants and Nurse Practitioners) who all work together to provide you with the care you need, when you need it.  Thank you for choosing [facility] at [facility]!!         Signed, Shelva Majestic, MD, [facility]  10/15/2018 7:05 PM    [facility] [address] [place], [place], [place]  [address] Phone: [phone]

## 2018-10-15 NOTE — Patient Instructions (Addendum)
Follow-Up: You will need a follow up appointment in 3 months.  You may see Shelva Majestic, MD or one of the following Advanced Practice Providers on your designated Care Team: [name], PA-C Fabian Sharp, [place]       Medication Instructions:  INCREASE HYDRALAZINE 75MG  TWICE DAILY THIS WILL BE 1-1/2 TABLETS TWICE DAILY. The current medical regimen is effective;  continue present plan and medications as directed. Please refer to the Current Medication list given to you today. If you need a refill on your cardiac medications before your next appointment, please call your pharmacy. Labwork: When you have labs (blood work) and your tests are completely normal, you will receive your results ONLY by [place] (if you have MyChart) -OR- A paper copy in the mail.  At [facility], you and your health needs are our priority.  As part of our continuing mission to provide you with exceptional heart care, we have created designated Provider Care Teams.  These Care Teams include your primary Cardiologist (physician) and Advanced Practice Providers (APPs -  Physician Assistants and Nurse Practitioners) who all work together to provide you with the care you need, when you need it.  Thank you for choosing [facility] at [facility]!!

## 2018-12-09 ENCOUNTER — Other Ambulatory Visit: Payer: Self-pay | Admitting: Cardiovascular Disease

## 2019-01-14 ENCOUNTER — Ambulatory Visit: Payer: Medicare Other | Admitting: Cardiovascular Disease

## 2019-01-14 ENCOUNTER — Encounter: Payer: Self-pay | Admitting: Cardiovascular Disease

## 2019-01-14 ENCOUNTER — Other Ambulatory Visit: Payer: Self-pay

## 2019-01-14 VITALS — BP 148/64 | HR 60 | Temp 97.3°F | Ht 68.0 in | Wt 221.0 lb

## 2019-01-14 DIAGNOSIS — I10 Essential (primary) hypertension: Secondary | ICD-10-CM | POA: Diagnosis not present

## 2019-01-14 DIAGNOSIS — G4733 Obstructive sleep apnea (adult) (pediatric): Secondary | ICD-10-CM | POA: Diagnosis not present

## 2019-01-14 DIAGNOSIS — I4510 Unspecified right bundle-branch block: Secondary | ICD-10-CM | POA: Diagnosis not present

## 2019-01-14 DIAGNOSIS — N1831 Chronic kidney disease, stage 3a: Secondary | ICD-10-CM

## 2019-01-14 DIAGNOSIS — Z9989 Dependence on other enabling machines and devices: Secondary | ICD-10-CM

## 2019-01-14 DIAGNOSIS — Z6833 Body mass index (BMI) 33.0-33.9, adult: Secondary | ICD-10-CM

## 2019-01-14 DIAGNOSIS — E782 Mixed hyperlipidemia: Secondary | ICD-10-CM

## 2019-01-14 DIAGNOSIS — E6609 Other obesity due to excess calories: Secondary | ICD-10-CM

## 2019-01-14 MED ORDER — HYDRALAZINE HCL 50 MG PO TABS: ORAL_TABLET | ORAL | 1 refills | Status: AC

## 2019-01-14 NOTE — Progress Notes (Signed)
Cardiology Office Note    Date:  01/14/2019   ID:  Kristy AJA, DOB 1950-04-02, MRN [id number]  PCP:  [name], MD  Cardiologist:  Shelva Majestic, MD   No chief complaint on file.   History of Present Illness:  Kristy Dougherty is a 68 y.o. female who presents for a 3  month follow-up cardiology/sleep evaluation.  [name] has a history of obesity, hypertension, type 2 diabetes mellitus, marked mixed hyperlipidemia with an atherogenic profile, and severe obstructive sleep apnea.  In 2011, she had undergone a cardiac catheterization by Dr. Tina Griffiths and had normal coronary arteries.  In 2011 she was referred for a sleep study and was found to have severe obstructive sleep apnea with an AHI of 68.3 per hour.  She had significant oxygen desaturation to 74% and had loud snoring.  She underwent CPAP titration CPAP pressure of 12 cm water pressure was recommended.  Her DME company used to be [employer], but they have gone out of business.  She has not had supplies in several years.  Her mask is very old and des not have a good seal. She admits to 100% use. She typically goes to bed at 9:30 - 10 pm and wakes up at 5:30 am.  She goes to the bathroom typically one time per night. [employer] is her DME company for her CPAP supplies.  She admits to shortness of breath that has increased recently. She continues to smoke and has been smoking for at least 45 years currently up to 1 ppd. She has been on amlodipine 10 mg, lisinopril HCT 20/12.5 but she has been taking this twice a day, and Bystolic 5 mg.  She is diabetic on Levemir, Victoza and Tresiba, and for hyperlipidemia.  She has been taking Vytorin 10/20 in addition to Trilipix135 mg daily.  She has a peripheral neuropathy on gabapentin 300 mg twice a day.  She also has psoriatic arthritis and has been taking methotrexate.  When I  saw her In January 2018 she was hypertensive and bradycardic.  At that time, I reduced her Bystolic and added  spironolactone 12.5 mg daily.  She also had lab work done on 03/21/16  lab and she had the results on her phone, which I reviewed with her in detail.  Her GFR was 48.  BUN 20, Cr 1.14.  Potassium 4.6, with the addition of spironolactone.  Hemoglobin A1c was increased at 6.9.  Total cholesterol 194, HDL 38, triglycerides 320, LDL 91, and non-HDL cholesterol 156.  She denies any recent chest pressure.  She denies palpitations.   When I saw her in March 2018  I recommended further increase of spironolactone from 12.5 twice a day with further titration to 25 mg twice a day.  Blood pressure remained elevated.  I discontinued Vytorin in light of her persistent elevation of lipid studies and started her on Crestor 20 mg to take in addition to her try lipids 135 mg and recommended omega-3 fatty acid supplementation.  She underwent follow-up laboratory by Dr. [name] on 06/19/2016.  BUN 22, chronic 1.05.  LFTs were normal.  Her total cholesterol was 203, HDL 37, triglycerides had increased to 440 and non-HDL cholesterol was 166.  When I saw her in October 2018  Continued to smoke three quarters a pack of cigarettes per day.  She states that she has not been very rested with her diet.  She does not get much exercise.  She does have some hip discomfort, which  she blames secondary to Crestor.  She had seen Dr. [name]. .  She has been on a reduced dose of amlodipine at just 5 mg due to some prior ankle swelling but she believes the ankle swelling resolved once she increased spironolactone.  Follow-up blood work on 10/25/2016 showed a total cholesterol 160, HDL 33, LDL 66, but triglycerides remain significantly elevated at 436.  Hemoglobin A1c was 7.1.  Calcium was 10.4.  She had stable renal function.  She denies any chest tightness.  She was using  CPAP with 100% compliance.    She was in the hospital overnight on November 07, 2017 with generalized weakness and shortness of breath with exertion.  He had symptomatic  anemia with a hemoglobin of 6.6.  Potassium was 6.5 and creatinine 1.89.  She received 2 units of packed red cell transfusion.  Symptoms improved with red blood cell replacement.  She has a history of peptic ulcer disease.  She was supposed to have endoscopy with Dr. [name] of [facility] but apparently she was significant he bradycardic and this was canceled.  She saw [name] back in follow-up on November 26, 2017.  Her Bystolic was decreased to 2.5 mg and he added hydralazine 25 mg twice a day for blood pressure control.  She has now been on iron therapy since her hospitalization.    I  saw her in December 24, 2017.  Her blood pressure was elevated despite taking amlodipine 10 mg, hydralazine 25 mg twice a day, lisinopril HCT 20/12.5, spironolactone 12.5 mg in addition to a reduced dose of Bystolic at 2.5 mg.  She had stopped her furosemide.  She was no longer bradycardic with resting pulse in the 60s.  However she continued to have falling and I suggested rather than furosemide a trial of torsemide.  She had seen [name], [place] for follow-up evaluation and on January 21, 2018.  That time he had recommended further titration of torsemide to 30 mg daily to continue to leg swelling.  Lower extremity Doppler study did not reveal any DVT was noted that there was interstitial fluid in the calf.  She has been evaluated by Dr. [name] of nephrology for renal insufficiency.  Her creatinine had risen up to 2.12 but recently improved to 1.48.  She sees Dr. [name] for primary care and will be seeing her next week.  Her blood pressure has continued to be elevated.  Unfortunately she is still smoking three quarters of a pack per day.  She admits to 100% CPAP utilization.    When I last saw her in March 2020, her blood pressure was elevated at 162/78 despite taking amlodipine 10 mg, carvedilol 3.125 mg twice a day and hydralazine 25 mg twice a day.  She also was on torsemide 20 mg daily.  I recommended further titration of  hydralazine to 50 mg twice a day.  She states with this adjustment her blood pressure had improved.    She was evaluated by me on July 04, 2018 and a telemedicine visit.  Her blood pressure was improved on a regimen consisting of hydralazine 50 mg twice a day, carvedilol 3.125 mg twice a day, amlodipine 10 mg and torsemide 20 mg.  She had continued to be on fenofibrate, rosuvastatin and the vascepa for her mixed hyperlipidemia.  She was continuing to use CPAP with 100% compliance and typically sleeps at least 7-1/2 hours per night.    Her machine was beginning to malfunction and she did not feel that  she was getting adequate air.  Her machine was old and initially was her husband's and may be his oldest March 22, 2006.  Her husband died in 22-Mar-2008 and she has been using his machine since she was diagnosed in March 22, 2009.   During her June 2020 evaluation, I recommended that she be contacted by [facility] and obtain a new ResMed air sense 10 CPAP auto unit.  However, she had received the messages but was concerned about the call and therefore never responded and therefore was still using her machine over 67 years old.  When last seen in September 2020 she had not had any follow-up with reference to obtaining a new machine.  Her blood pressure was increased and I recommended slight additional titration of hydralazine from 50 mg twice a day to 75 mg twice a day.  Over this time.  She states her blood pressure has improved and often is in the 130s..  We contacted [facility] and she received a new ResMed air sense 10 auto CPAP unit on November 05, 2018.  She has noticed significant improvement in the new machine.  At present she has been set on an auto mode with a minimum pressure of 5 and maximum of 20.  A download was obtained in the office from October 6 through December 04, 2018 which showed 100% compliance.  AHI 0.1.  Her 95th percentile pressure was 13.  Upon questioning she does note that at the very beginning of  CPAP she does not feel that she is getting adequate pressure.  She presents for evaluation  Past Medical History:  Diagnosis Date  . Diabetes mellitus without complication ([place])   . Hyperlipemia   . Hypertension   . Normal coronary arteries 2009-03-22  . Obesity   . OSA on CPAP   . Shingles July 2014    Past Surgical History:  Procedure Laterality Date  . ABDOMINAL HYSTERECTOMY  1983  . CARDIAC CATHETERIZATION  04/02/2009   Normal coronary arteries  . US ECHOCARDIOGRAPHY  03/22/2009   EF =>55%,trace MR,mild TR & PI    Current Medications: Outpatient Medications Prior to Visit  Medication Sig Dispense Refill  . acetaminophen (TYLENOL) 500 MG tablet Take 500-1,000 mg by mouth every 6 (six) hours as needed (for pain).    Marland Kitchen albuterol (PROVENTIL HFA) 108 (90 Base) MCG/ACT inhaler Inhale 2 puffs into the lungs every 6 (six) hours as needed for wheezing or shortness of breath. 1 Inhaler 0  . amLODipine (NORVASC) 10 MG tablet TAKE 1 TABLET BY MOUTH EVERY DAY 90 tablet 3  . budesonide-formoterol (SYMBICORT) 160-4.5 MCG/ACT inhaler Inhale 2 puffs into the lungs 2 (two) times daily.    . carvedilol (COREG) 3.125 MG tablet Take 3.125 mg by mouth 2 (two) times daily.    . cyclobenzaprine (FLEXERIL) 5 MG tablet Take 5 mg by mouth 3 (three) times daily as needed for muscle spasms.    . fenofibrate 160 MG tablet Take 160 mg by mouth daily.    Marland Kitchen FLUoxetine (PROZAC) 20 MG capsule Take 20 mg by mouth daily.    . fluticasone (FLONASE) 50 MCG/ACT nasal spray Place 1-2 sprays into both nostrils daily as needed for allergies or rhinitis.    . folic acid (FOLVITE) 1 MG tablet Take 1 mg by mouth daily.    Marland Kitchen gabapentin (NEURONTIN) 100 MG capsule Take 300 mg by mouth at bedtime.     . methotrexate (RHEUMATREX) 2.5 MG tablet Take 15 mg by mouth every Tuesday.  Caution:Chemotherapy. Protect from light.    . mupirocin ointment (BACTROBAN) 2 % APPLY TO AFFECTED AREA TWICE A DAY    . omeprazole (PRILOSEC) 40 MG capsule  Take 1 capsule by mouth daily.    . rosuvastatin (CRESTOR) 40 MG tablet TAKE 1 TABLET BY MOUTH EVERY DAY 90 tablet 3  . torsemide (DEMADEX) 20 MG tablet Take 20 mg by mouth daily.    . traMADol (ULTRAM) 50 MG tablet Take 50 mg by mouth every 6 (six) hours as needed (for pain).     . TRESIBA FLEXTOUCH 200 UNIT/ML SOPN Inject 125 Units into the skin daily before breakfast.     . hydrALAZINE (APRESOLINE) 50 MG tablet Take 1.5 tablets (75 mg total) by mouth 2 (two) times daily. 270 tablet 1   No facility-administered medications prior to visit.     Allergies:   Patient has no known allergies.   Social History   Socioeconomic History  . Marital status: Widowed    Spouse name: Not on file  . Number of children: Not on file  . Years of education: Not on file  . Highest education level: Not on file  Occupational History  . Not on file  Tobacco Use  . Smoking status: Current Every Day Smoker    Packs/day: 1.00    Years: 40.00    Pack years: 40.00    Types: Cigarettes  . Smokeless tobacco: Never Used  Substance and Sexual Activity  . Alcohol use: No  . Drug use: No  . Sexual activity: Not on file  Other Topics Concern  . Not on file  Social History Narrative  . Not on file   Social Determinants of Health   Financial Resource Strain:   . Difficulty of Paying Living Expenses: Not on file  Food Insecurity:   . Worried About Charity fundraiser in the Last Year: Not on file  . Ran Out of Food in the Last Year: Not on file  Transportation Needs:   . Lack of Transportation (Medical): Not on file  . Lack of Transportation (Non-Medical): Not on file  Physical Activity:   . Days of Exercise per Week: Not on file  . Minutes of Exercise per Session: Not on file  Stress:   . Feeling of Stress : Not on file  Social Connections:   . Frequency of Communication with Friends and Family: Not on file  . Frequency of Social Gatherings with Friends and Family: Not on file  . Attends  Religious Services: Not on file  . Active Member of Clubs or Organizations: Not on file  . Attends Archivist Meetings: Not on file  . Marital Status: Not on file    Additional social history is notable in that her husband passed away after developing sepsis.  She works for [employer] in the [employer].  She is smoking 3/4-1 pack of cigarettes per day and has been smoking for 45 to almost 50 years.  She has been widowed since 2010.  She has 1 daughter age 68 and 5 grandchildren.  Family History:  The patient's family history includes Heart attack in her father; Lung cancer in her mother.  Her mother had lung CA.  Father died in his 81s but had numerous heart problems.  ROS General: Negative; No fevers, chills, or night sweats;  HEENT: Negative; No changes in vision or hearing, sinus congestion, difficulty swallowing Pulmonary: Negative; No cough, wheezing, shortness of breath, hemoptysis Cardiovascular:  See HPI  GI: Negative; No nausea, vomiting, diarrhea, or abdominal pain GU: Negative; No dysuria, hematuria, or difficulty voiding Musculoskeletal: Negative; no myalgias, joint pain, or weakness Rheumatologic: Positive for psoriatic arthritis Hematologic/Oncology: Negative; no easy bruising, bleeding Endocrine: Positive for diabetes mellitus Neuro: Negative; no changes in balance, headaches Skin: Negative; No rashes or skin lesions Psychiatric: Negative; No behavioral problems, depression Sleep: Positive for severe obstructive sleep apnea with prior loud snoring, daytime sleepiness, hypersomnolence, no bruxism, restless legs, hypnogognic hallucinations, no cataplexy Other comprehensive 14 point system review is negative.   PHYSICAL EXAM:   VS:  BP (!) 148/64   Pulse 60   Temp (!) 97.3 F (36.3 C)   Ht 5' 8" (1.727 m)   Wt 221 lb (100.2 kg)   SpO2 98%   BMI 33.60 kg/m     Repeat blood pressure by me was 146/70  Wt Readings from Last 3 Encounters:    01/14/19 221 lb (100.2 kg)  10/15/18 221 lb 6.4 oz (100.4 kg)  07/04/18 242 lb (109.8 kg)    General: Alert, oriented, no distress.  Skin: normal turgor, no rashes, warm and dry HEENT: Normocephalic, atraumatic. Pupils equal round and reactive to light; sclera anicteric; extraocular muscles intact;  Nose without nasal septal hypertrophy Mouth/Parynx benign; Mallinpatti scale 3 Neck: No JVD, no carotid bruits; normal carotid upstroke Lungs: clear to ausculatation and percussion; no wheezing or rales Chest wall: without tenderness to palpitation Heart: PMI not displaced, RRR, s1 s2 normal, 1/6 systolic murmur, no diastolic murmur, no rubs, gallops, thrills, or heaves Abdomen: soft, nontender; no hepatosplenomehaly, BS+; abdominal aorta nontender and not dilated by palpation. Back: no CVA tenderness Pulses 2+ Musculoskeletal: full range of motion, normal strength, no joint deformities Extremities: no clubbing cyanosis or edema, Homan's sign negative  Neurologic: grossly nonfocal; Cranial nerves grossly wnl Psychologic: Normal mood and affect   Studies/Labs Reviewed:   ECG (independently read by me): Normal sinus rhythm at 60 bpm.  Right bundle branch block with repolarization changes.  No ectopy.  Normal intervals.  September 2020 ECG (independently read by me): Sinus rhythm at 77 bpm.  Normal intervals  November 2019 ECG (independently read by me): Sinus rhythm at 67 bpm.  No ectopy.  Normal intervals.  October 2018 ECG (independently read by me): Sinus bradycardia 56 bpm.  Normal intervals.  No ectopy.  June 2018 ECG (independently read by me): Sinus bradycardia 54 bpm.  PR interval 196, QTc interval 398 ms.  No ST segment changes.  March 2018 ECG (independently read by me): Sinus bradycardia 54 bpm.  No ectopy.  Normal intervals.  January 2018 EKG:  EKG is  ordered today.  The ekg independently reviewed by me shows sinus bradycardia at 47 bpm.  QTc interval 398 ms.  PR interval  192 ms.  Recent Labs: BMP Latest Ref Rng & Units 01/29/2018 11/16/2017 11/08/2017  Glucose 65 - 99 mg/dL 150(H) 84 122(H)  BUN 8 - 27 mg/dL 39(H) 35(H) 33(H)  Creatinine 0.57 - 1.00 mg/dL 1.99(H) 1.36(H) 1.40(H)  BUN/Creat Ratio 12 - _0 -  Sodium 134 - 144 mmol/L 140 135 137  Potassium 3.5 - 5.2 mmol/L 5.2 5.3(H) 5.4(H)  Chloride 96 - 106 mmol/L 99 99 105  CO2 20 - 29 mmol/L _1 Calcium 8.7 - 10.3 mg/dL 9.5 9.0 8.9     Hepatic Function Latest Ref Rng & Units 11/07/2017  Total Protein 6.5 - 8.1 g/dL 6.6  Albumin 3.5 - 5.0 g/dL 3.0(L)  AST  15 - 41 U/L 26  ALT 0 - 44 U/L 12  Alk Phosphatase 38 - 126 U/L 73  Total Bilirubin 0.3 - 1.2 mg/dL 0.4    CBC Latest Ref Rng & Units 01/29/2018 11/08/2017 11/07/2017  WBC 3.4 - 10.8 x10E3/uL 9.8 10.3 10.1  Hemoglobin 11.1 - 15.9 g/dL 10.4(L) 8.2(L) 6.6(LL)  Hematocrit 34.0 - 46.6 % 32.0(L) 28.1(L) 23.9(L)  Platelets 150 - 450 x10E3/uL 261 237 267   Lab Results  Component Value Date   MCV 94 01/29/2018   MCV 88.9 11/08/2017   MCV 91.9 11/07/2017   No results found for: TSH No results found for: HGBA1C   BNP No results found for: BNP  ProBNP No results found for: PROBNP   Lipid Panel  No results found for: CHOL, TRIG, HDL, CHOLHDL, VLDL, LDLCALC, LDLDIRECT   RADIOLOGY: No results found.   Additional studies/ records that were reviewed today include:  I reviewed the patient's diagnostic sleep study and CPAP titration studies from April and June 2011.  I reviewed prior medical records.  Also reviewed laboratory from [facility] from last year, which showed a BUN of 23 Cr 1.05.;  Normal LFTs.  I personally review blood work from from 04/18/2016 done at [facility].  I reviewed the recent blood work  by Dr. [name]  Reviewed her recent hospitalization and subsequent office visits with [name], [facility]  ASSESSMENT:    1. Essential hypertension   2. OSA on CPAP   3. Mixed hyperlipidemia   4. RBBB   5. Stage 3a chronic kidney  disease   6. Class 1 obesity due to excess calories with serious comorbidity and body mass index (BMI) of 33.0 to 33.9 in adult     PLAN:  Ms Naidelyn Parrella Is a 68 year old widowed Caucasian female who has significant cardiovascular comorbidities including hypertension, type 2 diabetes mellitus, mixed hyperlipidemia, with remote triglyceride elevations in excess of 500 and severe obstructive sleep apnea since 2011.  Her blood pressure today is improved but still remains slightly elevated despite taking amlodipine 10 mg, carvedilol 3.125 mg twice a day, increased hydralazine at 75 mg twice a day and torsemide 20 mg daily.  Her resting pulse is in the 60s on her current low-dose carvedilol.  She states often times her blood pressure has been in the 120s to 130s.  I have previously recommended target blood pressure less than 130/80 with ideal blood pressure less than 120/80.  At present I am slightly increasing hydralazine to 100 mg in the morning but she will continue 75 mg at night.  She will monitor her blood pressure at home.  And if her blood pressure is staying 140 or above after 2 weeks she will increase hydralazine to 100 mg twice a day.  She received a new CPAP auto machine.  A download revealed 100% compliance with average sleep at 6 hours and 40 minutes.  I am making slight adjustment in her auto pressures such that her minimum pressure will now start at 8 rather than 5 with opportunity to increase his 20.  On her most recent download her 95th percentile pressure was 13 with a maximum average pressure at 14.  AHI was excellent at 0.1.  She continues to be on rosuvastatin 40 mg and fenofibrate 160 mg for her mixed hyperlipidemia.  Remotely I had initiated Vascepa but due to cost she discontinued this.  But studies on November 18, 2018 showed total cholesterol 169, HDL 39, LDL 82, and triglycerides 241.  She  admits to weight loss from a peak weight of 250 pounds earlier this year.  Her weight has been  stable over the past 3 months.  We again discussed the importance of increased exercise and weight reduction.  Her ECG today remained stable with sinus rhythm and right bundle branch block which is chronic.  She sees Dr. [name] for chronic kidney disease.  Laboratory in October 2020 showed a creatinine of 1.35.  I will see her in 6 months for follow-up evaluation or sooner if problems arise.   Medication Adjustments/Labs and Tests Ordered: Current medicines are reviewed at length with the patient today.  Concerns regarding medicines are outlined above.  Medication changes, Labs and Tests ordered today are listed in the Patient Instructions below.  Patient Instructions  Medication Instructions:  INCREASE Hydralazine to 159m in the morning and take 728min the evening  WATCH YOUR BLOOD PRESSURE if after a couple of weeks of new dose if your blood pressure is over 140's THEN INCREASE HYDRALAZINE TO 100MG TWICE A DAY  *If you need a refill on your cardiac medications before your next appointment, please call your pharmacy*  Lab Work: NONE  If you have labs (blood work) drawn today and your tests are completely normal, you will receive your results only by: . Marland KitchenyChart Message (if you have MyChart) OR . A paper copy in the mail If you have any lab test that is abnormal or we need to change your treatment, we will call you to review the results.  Testing/Procedures: NONE   Follow-Up: [facility].  As part of our continuing mission to provide you with exceptional heart care, we have created designated Provider Care Teams.  These Care Teams include your primary Cardiologist (physician) and Advanced Practice Providers (APPs -  Physician Assistants and Nurse Practitioners) who all work together to provide you with the care you need, when you need it.  Your next appointment:   6 month(s)  The format for your next appointment:   In Person  Provider:    ThShelva MajesticMD  Other Instructions    Signed, ThShelva MajesticMD, [facility]12/15/2020 8:35 AM    [place]  [id number]: [phone]

## 2019-01-14 NOTE — Patient Instructions (Signed)
Medication Instructions:  INCREASE Hydralazine to 100mg  in the morning and take 75mg  in the evening  WATCH YOUR BLOOD PRESSURE if after a couple of weeks of new dose if your blood pressure is over 140's THEN INCREASE HYDRALAZINE TO 100MG  TWICE A DAY  *If you need a refill on your cardiac medications before your next appointment, please call your pharmacy*  Lab Work: NONE  If you have labs (blood work) drawn today and your tests are completely normal, you will receive your results only by: Marland Kitchen MyChart Message (if you have MyChart) OR . A paper copy in the mail If you have any lab test that is abnormal or we need to change your treatment, we will call you to review the results.  Testing/Procedures: NONE   Follow-Up: At [facility], you and your health needs are our priority.  As part of our continuing mission to provide you with exceptional heart care, we have created designated Provider Care Teams.  These Care Teams include your primary Cardiologist (physician) and Advanced Practice Providers (APPs -  Physician Assistants and Nurse Practitioners) who all work together to provide you with the care you need, when you need it.  Your next appointment:   6 month(s)  The format for your next appointment:   In Person  Provider:   Shelva Majestic, MD  Other Instructions

## 2019-03-30 ENCOUNTER — Ambulatory Visit: Payer: Medicare Other | Attending: Internal Medicine

## 2019-03-30 DIAGNOSIS — Z23 Encounter for immunization: Secondary | ICD-10-CM

## 2019-03-30 NOTE — Progress Notes (Signed)
   Covid-19 Vaccination Clinic  Name:  Kristy Dougherty    MRN: [id number] DOB: 02-Mar-1950  03/30/2019  Kristy Dougherty was observed post Covid-19 immunization for 15 minutes without incidence. She was provided with Vaccine Information Sheet and instruction to access the V-Safe system.   Kristy Dougherty was instructed to call 911 with any severe reactions post vaccine: Marland Kitchen Difficulty breathing  . Swelling of your face and throat  . A fast heartbeat  . A bad rash all over your body  . Dizziness and weakness    Immunizations Administered    Name Date Dose VIS Date Route   Pfizer COVID-19 Vaccine 03/30/2019  8:20 AM 0.3 mL 01/10/2019 Intramuscular   Manufacturer: [place]   Lot: [id number]   [place]: [id number]

## 2019-04-23 ENCOUNTER — Ambulatory Visit: Payer: Medicare Other | Attending: Internal Medicine

## 2019-04-23 DIAGNOSIS — Z23 Encounter for immunization: Secondary | ICD-10-CM

## 2019-04-23 NOTE — Progress Notes (Signed)
   Covid-19 Vaccination Clinic  Name:  Kristy Dougherty    MRN: [id number] DOB: 12/01/1950  04/23/2019  Kristy Dougherty was observed post Covid-19 immunization for 15 minutes without incident. She was provided with Vaccine Information Sheet and instruction to access the V-Safe system.   Kristy Dougherty was instructed to call 911 with any severe reactions post vaccine: Marland Kitchen Difficulty breathing  . Swelling of face and throat  . A fast heartbeat  . A bad rash all over body  . Dizziness and weakness   Immunizations Administered    Name Date Dose VIS Date Route   Pfizer COVID-19 Vaccine 04/23/2019  8:13 AM 0.3 mL 01/10/2019 Intramuscular   Manufacturer: [place]   Lot: [id number]   [place]: [address]-9

## 2019-05-15 ENCOUNTER — Other Ambulatory Visit: Payer: Self-pay | Admitting: Family Medicine

## 2019-05-15 DIAGNOSIS — R5383 Other fatigue: Secondary | ICD-10-CM

## 2019-05-15 DIAGNOSIS — R634 Abnormal weight loss: Secondary | ICD-10-CM

## 2019-05-15 DIAGNOSIS — R0602 Shortness of breath: Secondary | ICD-10-CM

## 2019-05-19 ENCOUNTER — Ambulatory Visit
Admission: RE | Admit: 2019-05-19 | Discharge: 2019-05-19 | Disposition: A | Discharge status: Home / Self Care | Payer: Medicare Other | Source: Ambulatory Visit | Attending: Family Medicine | Admitting: Family Medicine

## 2019-05-19 ENCOUNTER — Other Ambulatory Visit: Payer: Self-pay | Admitting: Family Medicine

## 2019-05-19 DIAGNOSIS — R5383 Other fatigue: Secondary | ICD-10-CM

## 2019-05-19 DIAGNOSIS — R634 Abnormal weight loss: Secondary | ICD-10-CM

## 2019-05-19 DIAGNOSIS — R0602 Shortness of breath: Secondary | ICD-10-CM

## 2019-05-23 ENCOUNTER — Other Ambulatory Visit: Payer: Self-pay | Admitting: Family Medicine

## 2019-05-23 DIAGNOSIS — R935 Abnormal findings on diagnostic imaging of other abdominal regions, including retroperitoneum: Secondary | ICD-10-CM

## 2019-05-28 ENCOUNTER — Ambulatory Visit
Admission: RE | Admit: 2019-05-28 | Discharge: 2019-05-28 | Disposition: A | Discharge status: Home / Self Care | Payer: Medicare Other | Source: Ambulatory Visit | Attending: Family Medicine | Admitting: Family Medicine

## 2019-05-28 DIAGNOSIS — R935 Abnormal findings on diagnostic imaging of other abdominal regions, including retroperitoneum: Secondary | ICD-10-CM

## 2019-07-07 ENCOUNTER — Telehealth: Payer: Self-pay | Admitting: Cardiovascular Disease

## 2019-07-07 NOTE — Telephone Encounter (Signed)
Is patient still taking torsemide 20 mg daily.  If she is, for the next 2 to 3 days she may need to take 20 mg twice a day.  Try to see if her office visit with the APP can be moved to next week rather than being seen in 2 more weeks

## 2019-07-07 NOTE — Telephone Encounter (Signed)
Pt called to report that she has been having lower extremity edema bilaterally for a week and now her hands are puffy. She is mildly SOB but this is not completely abnormal for her.   She says she is not urinating well.. no symptoms of UTI. She declines added NA intake. She says she has not been eating well she has also developed "painful blisters" in her mouth and some on her legs. They are open and "wet"..   She has appt 07/22/19 with [name] PA.   Unable to find her a sooner appt to be seen this week with an APP.   I have asked if she has talked with her PCP.Marland Kitchen she says she called and spoke with her nurse and she says she is not sure what is going on and asked her to call her Cardiologist Dr. Claiborne Billings.   Will forward to him for review.Marland Kitchen

## 2019-07-07 NOTE — Telephone Encounter (Signed)
Pt c/o swelling: STAT is pt has developed SOB within 24 hours  1) How much weight have you gained and in what time span?  She does not know how much  2) If swelling, where is the swelling located? Feet, legs and hands  3) Are you currently taking a fluid pill? yes  4) Are you currently SOB? Yes  5) Do you have a log of your daily weights (if so, list)?  6) Have you gained 3 pounds in a day or 5 pounds in a week?   7) Have you traveled recently? No- pt wants to be seen

## 2019-07-08 NOTE — Telephone Encounter (Signed)
Pt aware of recommendations and agrees and also moved appt with [name] PA to 07/17/19 at 11:15 am Pt will call back if no improvement ./cy

## 2019-07-10 ENCOUNTER — Encounter (HOSPITAL_COMMUNITY): Payer: Self-pay

## 2019-07-10 ENCOUNTER — Emergency Department (HOSPITAL_COMMUNITY): Payer: Medicare Other

## 2019-07-10 ENCOUNTER — Inpatient Hospital Stay (HOSPITAL_COMMUNITY)
Admission: EM | Admit: 2019-07-10 | Discharge: 2019-07-31 | DRG: 808 | Disposition: E | Payer: Medicare Other | Attending: Internal Medicine | Admitting: Internal Medicine

## 2019-07-10 DIAGNOSIS — W19XXXA Unspecified fall, initial encounter: Secondary | ICD-10-CM | POA: Diagnosis present

## 2019-07-10 DIAGNOSIS — K1231 Oral mucositis (ulcerative) due to antineoplastic therapy: Secondary | ICD-10-CM | POA: Diagnosis present

## 2019-07-10 DIAGNOSIS — R296 Repeated falls: Secondary | ICD-10-CM | POA: Diagnosis present

## 2019-07-10 DIAGNOSIS — S2231XA Fracture of one rib, right side, initial encounter for closed fracture: Secondary | ICD-10-CM | POA: Diagnosis present

## 2019-07-10 DIAGNOSIS — L4050 Arthropathic psoriasis, unspecified: Secondary | ICD-10-CM | POA: Diagnosis present

## 2019-07-10 DIAGNOSIS — E669 Obesity, unspecified: Secondary | ICD-10-CM | POA: Diagnosis present

## 2019-07-10 DIAGNOSIS — R21 Rash and other nonspecific skin eruption: Secondary | ICD-10-CM | POA: Diagnosis present

## 2019-07-10 DIAGNOSIS — N184 Chronic kidney disease, stage 4 (severe): Secondary | ICD-10-CM | POA: Diagnosis present

## 2019-07-10 DIAGNOSIS — Z9181 History of falling: Secondary | ICD-10-CM | POA: Diagnosis not present

## 2019-07-10 DIAGNOSIS — L97919 Non-pressure chronic ulcer of unspecified part of right lower leg with unspecified severity: Secondary | ICD-10-CM | POA: Diagnosis present

## 2019-07-10 DIAGNOSIS — R7881 Bacteremia: Secondary | ICD-10-CM | POA: Diagnosis not present

## 2019-07-10 DIAGNOSIS — T501X5A Adverse effect of loop [high-ceiling] diuretics, initial encounter: Secondary | ICD-10-CM | POA: Diagnosis not present

## 2019-07-10 DIAGNOSIS — E1122 Type 2 diabetes mellitus with diabetic chronic kidney disease: Secondary | ICD-10-CM | POA: Diagnosis present

## 2019-07-10 DIAGNOSIS — Z79899 Other long term (current) drug therapy: Secondary | ICD-10-CM

## 2019-07-10 DIAGNOSIS — K7460 Unspecified cirrhosis of liver: Secondary | ICD-10-CM | POA: Diagnosis present

## 2019-07-10 DIAGNOSIS — N183 Chronic kidney disease, stage 3 unspecified: Secondary | ICD-10-CM | POA: Diagnosis present

## 2019-07-10 DIAGNOSIS — N179 Acute kidney failure, unspecified: Secondary | ICD-10-CM | POA: Diagnosis present

## 2019-07-10 DIAGNOSIS — D649 Anemia, unspecified: Secondary | ICD-10-CM

## 2019-07-10 DIAGNOSIS — Z8249 Family history of ischemic heart disease and other diseases of the circulatory system: Secondary | ICD-10-CM

## 2019-07-10 DIAGNOSIS — L97929 Non-pressure chronic ulcer of unspecified part of left lower leg with unspecified severity: Secondary | ICD-10-CM | POA: Diagnosis present

## 2019-07-10 DIAGNOSIS — A419 Sepsis, unspecified organism: Secondary | ICD-10-CM | POA: Diagnosis not present

## 2019-07-10 DIAGNOSIS — D61818 Other pancytopenia: Secondary | ICD-10-CM | POA: Diagnosis present

## 2019-07-10 DIAGNOSIS — B965 Pseudomonas (aeruginosa) (mallei) (pseudomallei) as the cause of diseases classified elsewhere: Secondary | ICD-10-CM

## 2019-07-10 DIAGNOSIS — G4733 Obstructive sleep apnea (adult) (pediatric): Secondary | ICD-10-CM | POA: Diagnosis present

## 2019-07-10 DIAGNOSIS — I10 Essential (primary) hypertension: Secondary | ICD-10-CM | POA: Diagnosis not present

## 2019-07-10 DIAGNOSIS — E785 Hyperlipidemia, unspecified: Secondary | ICD-10-CM | POA: Diagnosis present

## 2019-07-10 DIAGNOSIS — J9601 Acute respiratory failure with hypoxia: Secondary | ICD-10-CM | POA: Diagnosis not present

## 2019-07-10 DIAGNOSIS — S81802A Unspecified open wound, left lower leg, initial encounter: Secondary | ICD-10-CM | POA: Diagnosis present

## 2019-07-10 DIAGNOSIS — R233 Spontaneous ecchymoses: Secondary | ICD-10-CM | POA: Diagnosis present

## 2019-07-10 DIAGNOSIS — L98499 Non-pressure chronic ulcer of skin of other sites with unspecified severity: Secondary | ICD-10-CM | POA: Diagnosis present

## 2019-07-10 DIAGNOSIS — Z66 Do not resuscitate: Secondary | ICD-10-CM | POA: Diagnosis not present

## 2019-07-10 DIAGNOSIS — J690 Pneumonitis due to inhalation of food and vomit: Secondary | ICD-10-CM | POA: Diagnosis not present

## 2019-07-10 DIAGNOSIS — D61811 Other drug-induced pancytopenia: Secondary | ICD-10-CM | POA: Diagnosis present

## 2019-07-10 DIAGNOSIS — D849 Immunodeficiency, unspecified: Secondary | ICD-10-CM | POA: Diagnosis present

## 2019-07-10 DIAGNOSIS — S81801A Unspecified open wound, right lower leg, initial encounter: Secondary | ICD-10-CM | POA: Diagnosis present

## 2019-07-10 DIAGNOSIS — Z794 Long term (current) use of insulin: Secondary | ICD-10-CM

## 2019-07-10 DIAGNOSIS — A4152 Sepsis due to Pseudomonas: Secondary | ICD-10-CM | POA: Diagnosis not present

## 2019-07-10 DIAGNOSIS — E876 Hypokalemia: Secondary | ICD-10-CM | POA: Diagnosis present

## 2019-07-10 DIAGNOSIS — E119 Type 2 diabetes mellitus without complications: Secondary | ICD-10-CM

## 2019-07-10 DIAGNOSIS — Z801 Family history of malignant neoplasm of trachea, bronchus and lung: Secondary | ICD-10-CM | POA: Diagnosis not present

## 2019-07-10 DIAGNOSIS — Z23 Encounter for immunization: Secondary | ICD-10-CM | POA: Diagnosis not present

## 2019-07-10 DIAGNOSIS — K121 Other forms of stomatitis: Secondary | ICD-10-CM | POA: Diagnosis present

## 2019-07-10 DIAGNOSIS — Y95 Nosocomial condition: Secondary | ICD-10-CM | POA: Diagnosis not present

## 2019-07-10 DIAGNOSIS — T451X1A Poisoning by antineoplastic and immunosuppressive drugs, accidental (unintentional), initial encounter: Secondary | ICD-10-CM

## 2019-07-10 DIAGNOSIS — R34 Anuria and oliguria: Secondary | ICD-10-CM | POA: Diagnosis not present

## 2019-07-10 DIAGNOSIS — H16429 Pannus (corneal), unspecified eye: Secondary | ICD-10-CM | POA: Diagnosis present

## 2019-07-10 DIAGNOSIS — T451X1S Poisoning by antineoplastic and immunosuppressive drugs, accidental (unintentional), sequela: Secondary | ICD-10-CM | POA: Diagnosis not present

## 2019-07-10 DIAGNOSIS — Z20822 Contact with and (suspected) exposure to covid-19: Secondary | ICD-10-CM | POA: Diagnosis present

## 2019-07-10 DIAGNOSIS — R0603 Acute respiratory distress: Secondary | ICD-10-CM

## 2019-07-10 DIAGNOSIS — T451X5A Adverse effect of antineoplastic and immunosuppressive drugs, initial encounter: Secondary | ICD-10-CM | POA: Diagnosis present

## 2019-07-10 DIAGNOSIS — Z7951 Long term (current) use of inhaled steroids: Secondary | ICD-10-CM

## 2019-07-10 DIAGNOSIS — R6520 Severe sepsis without septic shock: Secondary | ICD-10-CM | POA: Diagnosis not present

## 2019-07-10 DIAGNOSIS — M7989 Other specified soft tissue disorders: Secondary | ICD-10-CM | POA: Diagnosis present

## 2019-07-10 DIAGNOSIS — Z6831 Body mass index (BMI) 31.0-31.9, adult: Secondary | ICD-10-CM

## 2019-07-10 DIAGNOSIS — R0902 Hypoxemia: Secondary | ICD-10-CM

## 2019-07-10 DIAGNOSIS — F17210 Nicotine dependence, cigarettes, uncomplicated: Secondary | ICD-10-CM | POA: Diagnosis present

## 2019-07-10 DIAGNOSIS — I129 Hypertensive chronic kidney disease with stage 1 through stage 4 chronic kidney disease, or unspecified chronic kidney disease: Secondary | ICD-10-CM | POA: Diagnosis present

## 2019-07-10 DIAGNOSIS — L400 Psoriasis vulgaris: Secondary | ICD-10-CM | POA: Diagnosis present

## 2019-07-10 DIAGNOSIS — I4510 Unspecified right bundle-branch block: Secondary | ICD-10-CM | POA: Diagnosis present

## 2019-07-10 DIAGNOSIS — J9621 Acute and chronic respiratory failure with hypoxia: Secondary | ICD-10-CM | POA: Diagnosis not present

## 2019-07-10 HISTORY — DX: Arthropathic psoriasis, unspecified: L40.50

## 2019-07-10 HISTORY — DX: Chronic kidney disease, unspecified: N18.9

## 2019-07-10 LAB — CBC WITH DIFFERENTIAL/PLATELET
Abs Immature Granulocytes: 0.03 10*3/uL (ref 0.00–0.07)
Basophils Absolute: 0 10*3/uL (ref 0.0–0.1)
Basophils Relative: 1 %
Eosinophils Absolute: 0 10*3/uL (ref 0.0–0.5)
Eosinophils Relative: 3 %
HCT: 17.8 % — ABNORMAL LOW (ref 36.0–46.0)
Hemoglobin: 5.7 g/dL — CL (ref 12.0–15.0)
Immature Granulocytes: 2 %
Lymphocytes Relative: 27 %
Lymphs Abs: 0.4 10*3/uL — ABNORMAL LOW (ref 0.7–4.0)
MCH: 33.9 pg (ref 26.0–34.0)
MCHC: 32 g/dL (ref 30.0–36.0)
MCV: 106 fL — ABNORMAL HIGH (ref 80.0–100.0)
Monocytes Absolute: 0 10*3/uL — ABNORMAL LOW (ref 0.1–1.0)
Monocytes Relative: 1 %
Neutro Abs: 1 10*3/uL — ABNORMAL LOW (ref 1.7–7.7)
Neutrophils Relative %: 66 %
Platelets: 20 10*3/uL — CL (ref 150–400)
RBC: 1.68 MIL/uL — ABNORMAL LOW (ref 3.87–5.11)
RDW: 18.3 % — ABNORMAL HIGH (ref 11.5–15.5)
WBC: 1.6 10*3/uL — ABNORMAL LOW (ref 4.0–10.5)
nRBC: 1.9 % — ABNORMAL HIGH (ref 0.0–0.2)

## 2019-07-10 LAB — BASIC METABOLIC PANEL
Anion gap: 11 (ref 5–15)
BUN: 42 mg/dL — ABNORMAL HIGH (ref 8–23)
CO2: 23 mmol/L (ref 22–32)
Calcium: 7.7 mg/dL — ABNORMAL LOW (ref 8.9–10.3)
Chloride: 103 mmol/L (ref 98–111)
Creatinine, Ser: 2.62 mg/dL — ABNORMAL HIGH (ref 0.44–1.00)
GFR calc Af Amer: 21 mL/min — ABNORMAL LOW (ref >60)
GFR calc non Af Amer: 18 mL/min — ABNORMAL LOW (ref >60)
Glucose, Bld: 129 mg/dL — ABNORMAL HIGH (ref 70–99)
Potassium: 3.3 mmol/L — ABNORMAL LOW (ref 3.5–5.1)
Sodium: 137 mmol/L (ref 135–145)

## 2019-07-10 LAB — CBC
HCT: 17.9 % — ABNORMAL LOW (ref 36.0–46.0)
Hemoglobin: 5.7 g/dL — CL (ref 12.0–15.0)
MCH: 34.1 pg — ABNORMAL HIGH (ref 26.0–34.0)
MCHC: 31.8 g/dL (ref 30.0–36.0)
MCV: 107.2 fL — ABNORMAL HIGH (ref 80.0–100.0)
Platelets: 19 10*3/uL — CL (ref 150–400)
RBC: 1.67 MIL/uL — ABNORMAL LOW (ref 3.87–5.11)
RDW: 18.2 % — ABNORMAL HIGH (ref 11.5–15.5)
WBC: 1.5 10*3/uL — ABNORMAL LOW (ref 4.0–10.5)
nRBC: 1.3 % — ABNORMAL HIGH (ref 0.0–0.2)

## 2019-07-10 LAB — CBG MONITORING, ED: Glucose-Capillary: 144 mg/dL — ABNORMAL HIGH (ref 70–99)

## 2019-07-10 LAB — PREPARE RBC (CROSSMATCH)

## 2019-07-10 LAB — SARS CORONAVIRUS 2 BY RT PCR (HOSPITAL ORDER, PERFORMED IN ~~LOC~~ HOSPITAL LAB): SARS Coronavirus 2: NEGATIVE

## 2019-07-10 LAB — SAVE SMEAR(SSMR), FOR PROVIDER SLIDE REVIEW

## 2019-07-10 MED ORDER — INSULIN NPH (HUMAN) (ISOPHANE) 100 UNIT/ML ~~LOC~~ SUSP: 20.0000 [IU] | Freq: Two times a day (BID) | SUBCUTANEOUS | Status: DC

## 2019-07-10 MED ORDER — GABAPENTIN 300 MG PO CAPS
300.0000 mg | ORAL_CAPSULE | Freq: Every day | ORAL | Status: DC
  Administered 2019-07-10 – 2019-07-12 (×3): 300 mg via ORAL
  Filled 2019-07-10 (×3): qty 1

## 2019-07-10 MED ORDER — ONDANSETRON HCL 4 MG PO TABS: 4.0000 mg | ORAL_TABLET | Freq: Four times a day (QID) | ORAL | Status: DC | PRN

## 2019-07-10 MED ORDER — FENOFIBRATE 160 MG PO TABS
160.0000 mg | ORAL_TABLET | Freq: Every day | ORAL | Status: DC
  Administered 2019-07-11: 160 mg via ORAL
  Filled 2019-07-10: qty 1

## 2019-07-10 MED ORDER — CYCLOBENZAPRINE HCL 10 MG PO TABS
5.0000 mg | ORAL_TABLET | Freq: Three times a day (TID) | ORAL | Status: DC | PRN
  Administered 2019-07-14: 5 mg via ORAL
  Filled 2019-07-10: qty 1

## 2019-07-10 MED ORDER — LIDOCAINE VISCOUS HCL 2 % MT SOLN
15.0000 mL | Freq: Once | OROMUCOSAL | Status: AC
  Administered 2019-07-10: 15 mL via OROMUCOSAL
  Filled 2019-07-10: qty 15

## 2019-07-10 MED ORDER — HYDRALAZINE HCL 50 MG PO TABS
100.0000 mg | ORAL_TABLET | Freq: Every day | ORAL | Status: DC
  Administered 2019-07-11 – 2019-07-14 (×3): 100 mg via ORAL
  Filled 2019-07-10 (×3): qty 2

## 2019-07-10 MED ORDER — SODIUM CHLORIDE 0.9 % IV SOLN
10.0000 mL/h | Freq: Once | INTRAVENOUS | Status: AC
  Administered 2019-07-10: 10 mL/h via INTRAVENOUS

## 2019-07-10 MED ORDER — PANTOPRAZOLE SODIUM 40 MG PO TBEC
80.0000 mg | DELAYED_RELEASE_TABLET | Freq: Every day | ORAL | Status: DC
  Administered 2019-07-11 – 2019-07-14 (×3): 80 mg via ORAL
  Filled 2019-07-10 (×3): qty 2

## 2019-07-10 MED ORDER — ALBUTEROL SULFATE (2.5 MG/3ML) 0.083% IN NEBU: 3.0000 mL | INHALATION_SOLUTION | Freq: Four times a day (QID) | RESPIRATORY_TRACT | Status: DC | PRN

## 2019-07-10 MED ORDER — INSULIN ASPART 100 UNIT/ML ~~LOC~~ SOLN: 0.0000 [IU] | Freq: Every day | SUBCUTANEOUS | Status: DC

## 2019-07-10 MED ORDER — FOLIC ACID 1 MG PO TABS
1.0000 mg | ORAL_TABLET | Freq: Every day | ORAL | Status: DC
  Administered 2019-07-10 – 2019-07-14 (×4): 1 mg via ORAL
  Filled 2019-07-10 (×4): qty 1

## 2019-07-10 MED ORDER — TORSEMIDE 20 MG PO TABS
20.0000 mg | ORAL_TABLET | Freq: Two times a day (BID) | ORAL | Status: DC
  Administered 2019-07-11: 20 mg via ORAL
  Filled 2019-07-10: qty 1

## 2019-07-10 MED ORDER — ONDANSETRON HCL 4 MG/2ML IJ SOLN: 4.0000 mg | Freq: Four times a day (QID) | INTRAMUSCULAR | Status: DC | PRN

## 2019-07-10 MED ORDER — HYDRALAZINE HCL 50 MG PO TABS
75.0000 mg | ORAL_TABLET | Freq: Every day | ORAL | Status: DC
  Administered 2019-07-10: 75 mg via ORAL
  Filled 2019-07-10: qty 3

## 2019-07-10 MED ORDER — FLUOXETINE HCL 20 MG PO CAPS
20.0000 mg | ORAL_CAPSULE | Freq: Every day | ORAL | Status: DC
  Administered 2019-07-11 – 2019-07-12 (×2): 20 mg via ORAL
  Filled 2019-07-10 (×2): qty 1

## 2019-07-10 MED ORDER — TRAMADOL HCL 50 MG PO TABS
50.0000 mg | ORAL_TABLET | Freq: Four times a day (QID) | ORAL | Status: DC | PRN
  Administered 2019-07-12 – 2019-07-14 (×3): 50 mg via ORAL
  Filled 2019-07-10 (×3): qty 1

## 2019-07-10 MED ORDER — ROSUVASTATIN CALCIUM 20 MG PO TABS
40.0000 mg | ORAL_TABLET | Freq: Every day | ORAL | Status: DC
  Administered 2019-07-11: 40 mg via ORAL
  Filled 2019-07-10: qty 2

## 2019-07-10 MED ORDER — CARVEDILOL 3.125 MG PO TABS
3.1250 mg | ORAL_TABLET | Freq: Two times a day (BID) | ORAL | Status: DC
  Administered 2019-07-10 – 2019-07-14 (×7): 3.125 mg via ORAL
  Filled 2019-07-10 (×7): qty 1

## 2019-07-10 MED ORDER — ACETAMINOPHEN 500 MG PO TABS
500.0000 mg | ORAL_TABLET | Freq: Four times a day (QID) | ORAL | Status: DC | PRN
  Administered 2019-07-11 – 2019-07-12 (×3): 1000 mg via ORAL
  Filled 2019-07-10 (×4): qty 2

## 2019-07-10 MED ORDER — MOMETASONE FURO-FORMOTEROL FUM 200-5 MCG/ACT IN AERO
2.0000 | INHALATION_SPRAY | Freq: Two times a day (BID) | RESPIRATORY_TRACT | Status: DC
  Administered 2019-07-11 – 2019-07-12 (×3): 2 via RESPIRATORY_TRACT
  Filled 2019-07-10 (×2): qty 8.8

## 2019-07-10 MED ORDER — INSULIN NPH (HUMAN) (ISOPHANE) 100 UNIT/ML ~~LOC~~ SUSP
6.0000 [IU] | Freq: Two times a day (BID) | SUBCUTANEOUS | Status: DC
  Administered 2019-07-11 (×2): 6 [IU] via SUBCUTANEOUS
  Filled 2019-07-10: qty 10

## 2019-07-10 MED ORDER — FLUTICASONE PROPIONATE 50 MCG/ACT NA SUSP
1.0000 | Freq: Every day | NASAL | Status: DC | PRN
  Filled 2019-07-10: qty 16

## 2019-07-10 MED ORDER — INSULIN ASPART 100 UNIT/ML ~~LOC~~ SOLN
0.0000 [IU] | Freq: Three times a day (TID) | SUBCUTANEOUS | Status: DC
  Administered 2019-07-11 (×2): 2 [IU] via SUBCUTANEOUS

## 2019-07-10 MED ORDER — AMLODIPINE BESYLATE 10 MG PO TABS
10.0000 mg | ORAL_TABLET | Freq: Every day | ORAL | Status: DC
  Administered 2019-07-11: 10 mg via ORAL
  Filled 2019-07-10: qty 1

## 2019-07-10 NOTE — H&P (Signed)
History and Physical    Kristy Dougherty QQI:[id number] DOB: 1950-06-28 DOA: 07/14/2019  PCP: [name], MD  Patient coming from: Home  I have personally briefly reviewed patient's old medical records in [place]  Chief Complaint: Generalized weakness  HPI: Kristy Dougherty is a 69 y.o. female with medical history significant of Psoriatic arthritis on MTX, DM2, HTN, CKD stage 3-4 follows with Dr. [name].  Pt presents to the ED with c/o several weeks to month+ history of progressive generalized weakness, worsening difficulty with ADLs.  No focal weakness.  Had 2 falls a couple of weeks ago.  R sided CP that lasted about 2 weeks but has improved since then.  No blood thinners.  Does have rash, arms and especially on legs, admits to non-healing ulcers on legs where she picks off the psoriasis scabs.  Has had increased leg swelling recently as well.   ED Course: Pt found to have fairly severe pancytopenia: WBC 1.5k, HGB 5.7, platelets 19.  On diff: no blasts.  Smear saved.  Creat of 2.6 (last 1.99 in 2019, the 2.6 is probably near baseline given her known CKD stage 3-4).  2u PRBC transfusion, hospitalist asked to admit.   Review of Systems: As per HPI, otherwise all review of systems negative.  Past Medical History:  Diagnosis Date  . Diabetes mellitus without complication ([place])   . Hyperlipemia   . Hypertension   . Normal coronary arteries 2011  . Obesity   . OSA on CPAP   . Shingles July 2014    Past Surgical History:  Procedure Laterality Date  . ABDOMINAL HYSTERECTOMY  1983  . CARDIAC CATHETERIZATION  04/02/2009   Normal coronary arteries  . US ECHOCARDIOGRAPHY  03/22/2009   EF =>55%,trace MR,mild TR & PI     reports that she has been smoking cigarettes. She has a 40.00 pack-year smoking history. She has never used smokeless tobacco. She reports that she does not drink alcohol and does not use drugs.  No Known Allergies  Family History    Problem Relation Age of Onset  . Lung cancer Mother   . Heart attack Father      Prior to Admission medications   Medication Sig Start Date End Date Taking? Authorizing Provider  acetaminophen (TYLENOL) 500 MG tablet Take 500-1,000 mg by mouth every 6 (six) hours as needed (for pain).   Yes [provider]  amLODipine (NORVASC) 10 MG tablet TAKE 1 TABLET BY MOUTH EVERY DAY Patient taking differently: Take 10 mg by mouth daily.  12/10/18  Yes Troy Sine, MD  budesonide-formoterol [facility]) 160-4.5 MCG/ACT inhaler Inhale 2 puffs into the lungs 2 (two) times daily.   Yes [provider]  carvedilol (COREG) 3.125 MG tablet Take 3.125 mg by mouth 2 (two) times daily. 02/28/18  Yes [provider]  cyclobenzaprine (FLEXERIL) 5 MG tablet Take 5 mg by mouth 3 (three) times daily as needed for muscle spasms.   Yes [provider]  fenofibrate 160 MG tablet Take 160 mg by mouth daily. 05/28/18  Yes [provider]  FLUoxetine (PROZAC) 20 MG capsule Take 20 mg by mouth daily.   Yes [provider]  fluticasone (FLONASE) 50 MCG/ACT nasal spray Place 1-2 sprays into both nostrils daily as needed for allergies or rhinitis.   Yes [provider]  folic acid (FOLVITE) 1 MG tablet Take 1 mg by mouth daily.   Yes [provider]  gabapentin (NEURONTIN) 100 MG capsule Take  300 mg by mouth at bedtime.  10/20/12  Yes [provider]  hydrALAZINE (APRESOLINE) 50 MG tablet Take 2 tabs (100mg ) in the morning and 1.5 (75mg ) tablet in the pm Patient taking differently: Take 75 mg by mouth in the morning and at bedtime.  01/14/19  Yes Troy Sine, MD  insulin NPH-regular Human (70-30) 100 UNIT/ML injection Inject 45 Units into the skin 2 (two) times daily with a meal.   Yes [provider]  methotrexate (RHEUMATREX) 2.5 MG tablet Take 15 mg by mouth every Tuesday. Caution:Chemotherapy. Protect from light.   Yes [provider]  omeprazole (PRILOSEC) 40 MG capsule Take 1 capsule by mouth daily. 05/28/18  Yes [provider]  rosuvastatin (CRESTOR) 40 MG tablet TAKE 1 TABLET BY MOUTH EVERY DAY Patient taking differently: Take 40 mg by mouth daily.  09/13/18  Yes Troy Sine, MD  torsemide (DEMADEX) 20 MG tablet Take 20 mg by mouth 2 (two) times daily.    Yes [provider]  traMADol (ULTRAM) 50 MG tablet Take 50 mg by mouth every 6 (six) hours as needed (for pain).  10/14/12  Yes [provider]  albuterol (PROVENTIL HFA) 108 (90 Base) MCG/ACT inhaler Inhale 2 puffs into the lungs every 6 (six) hours as needed for wheezing or shortness of breath. 11/08/17   Geradine Girt, DO    Physical Exam: Vitals:   07/24/2019 2153 07/08/2019 2208 07/06/2019 2223 07/21/2019 2230  BP:    (!) 156/56  Pulse: 83 75 77 74  Resp: 19 16 17 16   Temp:      TempSrc:      SpO2: 98% 100% 99% 98%  Weight:      Height:        Constitutional: NAD, calm, comfortable Eyes: PERRL, lids and conjunctivae normal ENMT: Mucous membranes are moist. Posterior pharynx clear of any exudate or lesions.Normal dentition.  Neck: normal, supple, no masses, no thyromegaly Respiratory: clear to auscultation bilaterally, no wheezing, no crackles. Normal respiratory effort. No accessory muscle use.  Cardiovascular: Regular rate and rhythm, no murmurs / rubs / gallops. No extremity edema. 2+ pedal pulses. No carotid bruits.  Abdomen: no tenderness, no masses palpated. No hepatosplenomegaly. Bowel sounds positive.  Musculoskeletal: no clubbing / cyanosis. No joint deformity upper and lower extremities. Good ROM, no contractures. Normal muscle tone.  Skin: Rash and ulceration of both legs. Neurologic: CN 2-12 grossly intact. Sensation intact, DTR normal. Strength 5/5 in all 4.  Psychiatric: Normal judgment and insight. Alert and oriented x 3. Normal mood.    Labs on Admission: I have personally reviewed following labs  and imaging studies  CBC: Recent Labs  Lab 07/29/2019 1641  WBC 1.6*  1.5*  NEUTROABS 1.0*  HGB 5.7*  5.7*  HCT 17.8*  17.9*  MCV 106.0*  107.2*  PLT 20*  19*   Basic Metabolic Panel: Recent Labs  Lab 07/16/2019 1641  NA 137  K 3.3*  CL 103  CO2 23  GLUCOSE 129*  BUN 42*  CREATININE 2.62*  CALCIUM 7.7*   GFR: Estimated Creatinine Clearance: 25.7 mL/min (A) (by C-G formula based on SCr of 2.62 mg/dL (H)). Liver Function Tests: No results for input(s): AST, ALT, ALKPHOS, BILITOT, PROT, ALBUMIN in the last 168 hours. No results for input(s): LIPASE, AMYLASE in the last 168 hours. No results for input(s): AMMONIA in the last 168 hours. Coagulation Profile: No results for input(s): INR, PROTIME in the last 168 hours. Cardiac Enzymes:  No results for input(s): CKTOTAL, CKMB, CKMBINDEX, TROPONINI in the last 168 hours. BNP (last 3 results) No results for input(s): PROBNP in the last 8760 hours. HbA1C: No results for input(s): HGBA1C in the last 72 hours. CBG: Recent Labs  Lab 07/20/2019 1825  GLUCAP 144*   Lipid Profile: No results for input(s): CHOL, HDL, LDLCALC, TRIG, CHOLHDL, LDLDIRECT in the last 72 hours. Thyroid Function Tests: No results for input(s): TSH, T4TOTAL, FREET4, T3FREE, THYROIDAB in the last 72 hours. Anemia Panel: No results for input(s): VITAMINB12, FOLATE, FERRITIN, TIBC, IRON, RETICCTPCT in the last 72 hours. Urine analysis: No results found for: COLORURINE, APPEARANCEUR, LABSPEC, [place], GLUCOSEU, HGBUR, BILIRUBINUR, KETONESUR, PROTEINUR, UROBILINOGEN, NITRITE, LEUKOCYTESUR  Radiological Exams on Admission: DG Chest Port 1 View  Result Date: 07/20/2019 CLINICAL DATA:  Generalized weakness for 3 weeks EXAM: PORTABLE CHEST 1 VIEW COMPARISON:  11/07/2017 FINDINGS: Single frontal view of the chest demonstrates a stable cardiac silhouette. No airspace disease, effusion, or pneumothorax. Minimally displaced right posterior seventh rib fracture is  noted. IMPRESSION: 1. Minimally displaced right posterior seventh rib fracture. 2. Otherwise no acute intrathoracic process. Electronically Signed   By: Randa Ngo M.D.   On: 07/13/2019 19:17    EKG: Independently reviewed.  Assessment/Plan Principal Problem:   Pancytopenia ([place]) Active Problems:   Diabetes mellitus ([place])   HTN (hypertension)   Fracture of one rib, right side, initial encounter for closed fracture   CKD (chronic kidney disease) stage 4, GFR 15-29 ml/min (HCC)    1. Pancytopenia - 1. Spoke with Dr. Lindi Adie: 1. No blasts, so doesn't seem to be acute leukemia 2. He wonders if it might not be BMS in setting of MTX treatment with kidney function. 3. Although her med rec initially said she was on 15mg  Q week of MTX, per pt she takes just 5mg  Q week of MTX... 4. Holding MTX 5. Transfuse 2u PRBC 6. He will see in consult. 7. BMB if not improving off of MTX 2. Repeat CBC/BMP in AM 3. Check LFT 4. No fevers, normal vital signs, subacute onset, normal mental status, baseline or near baseline kidney function: 1. DIC or TTP seems very unlikely 5. Transfusion in process right now 2. Fracture of R 7th rib - 1. Sounds like she broke this several weeks ago 2. CP resolved about 2 weeks after the fall, and is now resolved at this point. 3. Psoriatic arthritis - 1. Hold MTX 1. Not sure the MTX was working all that well anyhow given the skin lesions on leg. 2. Wound care consult for leg wounds. 4. DM2 - 1. Currently taking ~12u BID of 70/30 at home 2. Will put on NPH 6u BID 3. And mod scale SSI AC/HS 5. HTN - 1. Cont home BP meds  DVT prophylaxis: None - thrombocytopenia, and B leg wounds look pretty painful to try and put SCDs over those. Code Status: Full Family Communication: No family in room Disposition Plan: Home after pancytopenia treatment and work up of cause Consults called: Dr. Lindi Adie Admission status: Admit to inpatient  Severity of Illness: The  appropriate patient status for this patient is INPATIENT. Inpatient status is judged to be reasonable and necessary in order to provide the required intensity of service to ensure the patient's safety. The patient's presenting symptoms, physical exam findings, and initial radiographic and laboratory data in the context of their chronic comorbidities is felt to place them at high risk for further clinical deterioration. Furthermore, it is not anticipated that the patient  will be medically stable for discharge from the hospital within 2 midnights of admission. The following factors support the patient status of inpatient.   IP status due to severe pancytopenia, anemia with HGB 5.7 requiring transfusion.   * I certify that at the point of admission it is my clinical judgment that the patient will require inpatient hospital care spanning beyond 2 midnights from the point of admission due to high intensity of service, high risk for further deterioration and high frequency of surveillance required.*    Nirvan Laban M. DO [facility]  How to contact the [facility] Attending or Consulting provider [place] or covering provider during after hours [place], for this patient?  1. Check the care team in [facility] and look for a) attending/consulting [facility] provider listed and b) the [facility] team listed 2. Log into [email or web address]  Amion Physician Scheduling and messaging for groups and whole hospitals  On call and physician scheduling software for group practices, residents, hospitalists and other medical providers for call, clinic, rotation and shift schedules. On[employer] is a hospital-wide system for scheduling doctors and paging doctors on call. EasyPlot is for scientific plotting and data analysis.  [email or web address]  and use [place]'s universal password to access. If you do not have the password, please contact the hospital operator.  3. Locate the [facility] provider you are looking for under [facility] and  page to a number that you can be directly reached. 4. If you still have difficulty reaching the provider, please page the [facility] (Director on Call) for the Hospitalists listed on amion for assistance.  07/20/2019, 10:45 PM

## 2019-07-10 NOTE — ED Provider Notes (Signed)
[facility] EMERGENCY DEPARTMENT Provider Note   CSN: [id number] Arrival date & time: 07/30/2019  1601     History Chief Complaint  Patient presents with  . Weakness    Kristy Dougherty is a 69 y.o. female.  HPI    Patient presents with concern of weakness, skin lesions. Onset was at least a few weeks ago, possibly longer.  She presents with her daughter who assists with the HPI. Now, over the past week she has had progressive weakness, decreased ability to perform ADL, including getting out of bed, walking. She has had falls, though none in the last day or so, seemingly. No focal weakness, no confusion, no fever, no vomiting.  No hematuria, no melena, no hematemesis. Patient takes no blood thinning medication. She does have multiple medical issues including CKD, cirrhosis, psoriatic arthritis.  She takes methotrexate as well as multiple other medication, denies recent medication changes.  Past Medical History:  Diagnosis Date  . Diabetes mellitus without complication ([place])   . Hyperlipemia   . Hypertension   . Normal coronary arteries 2011  . Obesity   . OSA on CPAP   . Shingles July 2014    Patient Active Problem List   Diagnosis Date Noted  . AKI (acute kidney injury) ([place]) 11/08/2017  . Symptomatic anemia 11/07/2017  . Chest pain with moderate risk of acute coronary syndrome 08/29/2012  . Diabetes mellitus ([place]) 08/29/2012  . HTN (hypertension) 08/29/2012  . Dyslipidemia 08/29/2012  . Obesity 08/29/2012  . Sleep apnea- on C-pap 08/29/2012  . Smoker 08/29/2012  . Family history of coronary artery disease 08/29/2012  . Shingles outbreak 08/29/2012  . Normal coronary arteries- 2011 08/29/2012  . Dyspnea 08/29/2012    Past Surgical History:  Procedure Laterality Date  . ABDOMINAL HYSTERECTOMY  1983  . CARDIAC CATHETERIZATION  04/02/2009   Normal coronary arteries  . US ECHOCARDIOGRAPHY  03/22/2009   EF =>55%,trace MR,mild TR & PI     OB  History   No obstetric history on file.     Family History  Problem Relation Age of Onset  . Lung cancer Mother   . Heart attack Father     Social History   Tobacco Use  . Smoking status: Current Every Day Smoker    Packs/day: 1.00    Years: 40.00    Pack years: 40.00    Types: Cigarettes  . Smokeless tobacco: Never Used  Vaping Use  . Vaping Use: Never used  Substance Use Topics  . Alcohol use: No  . Drug use: No    Home Medications Prior to Admission medications   Medication Sig Start Date End Date Taking? Authorizing Provider  acetaminophen (TYLENOL) 500 MG tablet Take 500-1,000 mg by mouth every 6 (six) hours as needed (for pain).    [provider]  albuterol (PROVENTIL HFA) 108 (90 Base) MCG/ACT inhaler Inhale 2 puffs into the lungs every 6 (six) hours as needed for wheezing or shortness of breath. 11/08/17   Geradine Girt, DO  amLODipine (NORVASC) 10 MG tablet TAKE 1 TABLET BY MOUTH EVERY DAY 12/10/18   Troy Sine, MD  budesonide-formoterol [facility]) 160-4.5 MCG/ACT inhaler Inhale 2 puffs into the lungs 2 (two) times daily.    [provider]  carvedilol (COREG) 3.125 MG tablet Take 3.125 mg by mouth 2 (two) times daily. 02/28/18   [provider]  cyclobenzaprine (FLEXERIL) 5 MG tablet Take 5 mg by mouth 3 (three) times daily as needed  for muscle spasms.    [provider]  fenofibrate 160 MG tablet Take 160 mg by mouth daily. 05/28/18   [provider]  FLUoxetine (PROZAC) 20 MG capsule Take 20 mg by mouth daily.    [provider]  fluticasone (FLONASE) 50 MCG/ACT nasal spray Place 1-2 sprays into both nostrils daily as needed for allergies or rhinitis.    [provider]  folic acid (FOLVITE) 1 MG tablet Take 1 mg by mouth daily.    [provider]  gabapentin (NEURONTIN) 100 MG capsule Take 300 mg by mouth at bedtime.  10/20/12   [provider]  hydrALAZINE (APRESOLINE) 50 MG  tablet Take 2 tabs (100mg ) in the morning and 1.5 (75mg ) tablet in the pm 01/14/19   Troy Sine, MD  methotrexate (RHEUMATREX) 2.5 MG tablet Take 15 mg by mouth every Tuesday. Caution:Chemotherapy. Protect from light.    [provider]  mupirocin ointment (BACTROBAN) 2 % APPLY TO AFFECTED AREA TWICE A DAY 01/07/19   [provider]  omeprazole (PRILOSEC) 40 MG capsule Take 1 capsule by mouth daily. 05/28/18   [provider]  rosuvastatin (CRESTOR) 40 MG tablet TAKE 1 TABLET BY MOUTH EVERY DAY 09/13/18   Troy Sine, MD  torsemide (DEMADEX) 20 MG tablet Take 20 mg by mouth daily.    [provider]  traMADol (ULTRAM) 50 MG tablet Take 50 mg by mouth every 6 (six) hours as needed (for pain).  10/14/12   [provider]  TRESIBA FLEXTOUCH 200 UNIT/ML SOPN Inject 125 Units into the skin daily before breakfast.  02/01/16   [provider]    Allergies    Patient has no known allergies.  Review of Systems   Review of Systems  Constitutional:       Per HPI, otherwise negative  HENT:       Per HPI, otherwise negative  Respiratory:       Per HPI, otherwise negative  Cardiovascular:       Per HPI, otherwise negative  Gastrointestinal: Negative for vomiting.  Endocrine:       Negative aside from HPI  Genitourinary:       Neg aside from HPI   Musculoskeletal:       Per HPI, otherwise negative  Skin: Positive for wound.  Neurological: Positive for weakness and light-headedness. Negative for syncope.    Physical Exam Updated Vital Signs BP (!) 152/48 (BP Location: Right Arm)   Pulse 77   Temp 97.6 F (36.4 C) (Oral)   Resp (!) 22   Ht 5\' 9"  (1.753 m)   Wt 95.7 kg   SpO2 98%   BMI 31.16 kg/m   Physical Exam Vitals and nursing note reviewed.  Constitutional:      General: She is not in acute distress.    Appearance: She is well-developed.  HENT:     Head: Normocephalic and atraumatic.     Mouth/Throat:     Comments:  Along the palate and posterior there are small less than 0.5 cm ulcerations.  No substantial swelling, no confluent erythema. Eyes:     Conjunctiva/sclera: Conjunctivae normal.  Cardiovascular:     Rate and Rhythm: Normal rate and regular rhythm.  Pulmonary:     Effort: Pulmonary effort is normal. No respiratory distress.     Breath sounds: Normal breath sounds. No stridor.  Abdominal:     General: There is no distension.  Musculoskeletal:     Right lower leg:  Edema present.     Left lower leg: Edema present.  Skin:    General: Skin is warm and dry.     Coloration: Skin is pale.       Neurological:     Mental Status: She is alert and oriented to person, place, and time.     Cranial Nerves: No cranial nerve deficit.      ED Results / Procedures / Treatments   Labs (all labs ordered are listed, but only abnormal results are displayed) Labs Reviewed  BASIC METABOLIC PANEL - Abnormal; Notable for the following components:      Result Value   Potassium 3.3 (*)    Glucose, Bld 129 (*)    BUN 42 (*)    Creatinine, Ser 2.62 (*)    Calcium 7.7 (*)    GFR calc non Af Amer 18 (*)    GFR calc Af Amer 21 (*)    All other components within normal limits  CBC - Abnormal; Notable for the following components:   WBC 1.5 (*)    RBC 1.67 (*)    Hemoglobin 5.7 (*)    HCT 17.9 (*)    MCV 107.2 (*)    MCH 34.1 (*)    RDW 18.2 (*)    Platelets 19 (*)    nRBC 1.3 (*)    All other components within normal limits  URINALYSIS, ROUTINE W REFLEX MICROSCOPIC  CBG MONITORING, ED    EKG EKG Interpretation  Date/Time:  Thursday July 10 2019 16:05:08 EDT Ventricular Rate:  74 PR Interval:    QRS Duration: 132 QT Interval:  428 QTC Calculation: 475 R Axis:   108 Text Interpretation: Wide QRS rhythm Right bundle branch block Artifact T wave abnormality Abnormal ECG Confirmed by Carmin Muskrat [phone]) on 07/03/2019 5:37:44 PM   Radiology DG Chest Port 1 View  Result Date:  07/29/2019 CLINICAL DATA:  Generalized weakness for 3 weeks EXAM: PORTABLE CHEST 1 VIEW COMPARISON:  11/07/2017 FINDINGS: Single frontal view of the chest demonstrates a stable cardiac silhouette. No airspace disease, effusion, or pneumothorax. Minimally displaced right posterior seventh rib fracture is noted. IMPRESSION: 1. Minimally displaced right posterior seventh rib fracture. 2. Otherwise no acute intrathoracic process. Electronically Signed   By: Randa Ngo M.D.   On: 07/16/2019 19:17    Procedures Procedures (including critical care time)  Medications Ordered in ED Medications  0.9 %  sodium chloride infusion (has no administration in time range)  lidocaine (XYLOCAINE) 2 % viscous mouth solution 15 mL (15 mLs Mouth/Throat Given 07/21/2019 2017)    ED Course  I have reviewed the triage vital signs and the nursing notes.  Pertinent labs & imaging results that were available during my care of the patient were reviewed by me and considered in my medical decision making (see chart for details).   Initial labs notable for critically abnormal hemoglobin, 5.7, as well as thrombocytopenia, with platelet count of 19.  Given these alarming findings, the patient's presentation for weakness, differential including medication effects, GI bleed, infection, malignancy all considered. Additional labs ordered, fluids started, type and screen with initiation of transfusion.  8:30 PM Patient receiving initial transfusion, we discussed findings thus far including pancytopenia, and on repeat exam she remains awake and alert, though with multiple lesions as above. We discussed possibilities for pancytopenia, including some suspicion for drug-induced episode, possibly methotrexate related.  Patient continued to deny other overt sources of bleeding.  This elderly female presenting with generalized weakness, skin  lesions is found to have critically abnormal pancytopenia, requiring initiation of transfusion,  additional studies in the emergency department. Patient's leg wounds did not have overt infection, there are some suspicion for these being related to the patient's pancytopenia, fluid overload status, history of cirrhosis and renal disease.   MDM Number of Diagnoses or Management Options   Amount and/or Complexity of Data Reviewed Clinical lab tests: reviewed Tests in the radiology section of CPT: reviewed Tests in the medicine section of CPT: reviewed Decide to obtain previous medical records or to obtain history from someone other than the patient: yes Obtain history from someone other than the patient: yes Review and summarize past medical records: yes Discuss the patient with other providers: yes Independent visualization of images, tracings, or specimens: yes  Risk of Complications, Morbidity, and/or Mortality Presenting problems: high Diagnostic procedures: high Management options: high  Critical Care Total time providing critical care: 30-74 minutes  Patient Progress Patient progress: stable   Final Clinical Impression(s) / ED Diagnoses Final diagnoses:  Pancytopenia ([place])  Symptomatic anemia     Carmin Muskrat, MD 07/19/2019 2031

## 2019-07-10 NOTE — ED Triage Notes (Addendum)
Pt BIB [facility] for eval of weakness x 2 mos. Reports worse today to the point where she needed assistance getting out of bed. Pt also w/ scattered abrasions/lesions to skin that have been appearing over the last 2 mos as well. Edema to blt LE and abd distended more than normal. Decreased appetite "feels full", decreased UO. Last BM this Am, normal

## 2019-07-11 ENCOUNTER — Encounter (HOSPITAL_COMMUNITY): Payer: Self-pay | Admitting: Internal Medicine

## 2019-07-11 ENCOUNTER — Other Ambulatory Visit: Payer: Self-pay

## 2019-07-11 LAB — BASIC METABOLIC PANEL
Anion gap: 11 (ref 5–15)
Anion gap: 7 (ref 5–15)
BUN: 41 mg/dL — ABNORMAL HIGH (ref 8–23)
BUN: 44 mg/dL — ABNORMAL HIGH (ref 8–23)
CO2: 26 mmol/L (ref 22–32)
CO2: 26 mmol/L (ref 22–32)
Calcium: 7.6 mg/dL — ABNORMAL LOW (ref 8.9–10.3)
Calcium: 7.4 mg/dL — ABNORMAL LOW (ref 8.9–10.3)
Chloride: 102 mmol/L (ref 98–111)
Chloride: 105 mmol/L (ref 98–111)
Creatinine, Ser: 2.53 mg/dL — ABNORMAL HIGH (ref 0.44–1.00)
Creatinine, Ser: 2.54 mg/dL — ABNORMAL HIGH (ref 0.44–1.00)
GFR calc Af Amer: 22 mL/min — ABNORMAL LOW (ref >60)
GFR calc Af Amer: 22 mL/min — ABNORMAL LOW (ref >60)
GFR calc non Af Amer: 19 mL/min — ABNORMAL LOW (ref >60)
GFR calc non Af Amer: 19 mL/min — ABNORMAL LOW (ref >60)
Glucose, Bld: 115 mg/dL — ABNORMAL HIGH (ref 70–99)
Glucose, Bld: 149 mg/dL — ABNORMAL HIGH (ref 70–99)
Potassium: 2.8 mmol/L — ABNORMAL LOW (ref 3.5–5.1)
Potassium: 3.6 mmol/L (ref 3.5–5.1)
Sodium: 139 mmol/L (ref 135–145)
Sodium: 138 mmol/L (ref 135–145)

## 2019-07-11 LAB — URINALYSIS, ROUTINE W REFLEX MICROSCOPIC
Bilirubin Urine: NEGATIVE
Glucose, UA: 50 mg/dL — AB
Ketones, ur: NEGATIVE mg/dL
Nitrite: NEGATIVE
Protein, ur: >=300 mg/dL — AB
Specific Gravity, Urine: 1.013 (ref 1.005–1.030)
pH: 5 (ref 5.0–8.0)

## 2019-07-11 LAB — PREPARE RBC (CROSSMATCH)

## 2019-07-11 LAB — HEMOGLOBIN AND HEMATOCRIT, BLOOD
HCT: 20.5 % — ABNORMAL LOW (ref 36.0–46.0)
Hemoglobin: 6.9 g/dL — CL (ref 12.0–15.0)

## 2019-07-11 LAB — HEPATIC FUNCTION PANEL
ALT: 17 U/L (ref 0–44)
AST: 25 U/L (ref 15–41)
Albumin: 1.6 g/dL — ABNORMAL LOW (ref 3.5–5.0)
Alkaline Phosphatase: 181 U/L — ABNORMAL HIGH (ref 38–126)
Bilirubin, Direct: 0.6 mg/dL — ABNORMAL HIGH (ref 0.0–0.2)
Indirect Bilirubin: 1.6 mg/dL — ABNORMAL HIGH (ref 0.3–0.9)
Total Bilirubin: 2.2 mg/dL — ABNORMAL HIGH (ref 0.3–1.2)
Total Protein: 4.3 g/dL — ABNORMAL LOW (ref 6.5–8.1)

## 2019-07-11 LAB — GLUCOSE, CAPILLARY
Glucose-Capillary: 111 mg/dL — ABNORMAL HIGH (ref 70–99)
Glucose-Capillary: 125 mg/dL — ABNORMAL HIGH (ref 70–99)
Glucose-Capillary: 127 mg/dL — ABNORMAL HIGH (ref 70–99)
Glucose-Capillary: 135 mg/dL — ABNORMAL HIGH (ref 70–99)
Glucose-Capillary: 138 mg/dL — ABNORMAL HIGH (ref 70–99)

## 2019-07-11 LAB — HIV ANTIBODY (ROUTINE TESTING W REFLEX): HIV Screen 4th Generation wRfx: NONREACTIVE

## 2019-07-11 LAB — MAGNESIUM: Magnesium: 1.3 mg/dL — ABNORMAL LOW (ref 1.7–2.4)

## 2019-07-11 LAB — HEMOGLOBIN A1C
Hgb A1c MFr Bld: 6 % — ABNORMAL HIGH (ref 4.8–5.6)
Mean Plasma Glucose: 125.5 mg/dL

## 2019-07-11 LAB — LACTATE DEHYDROGENASE: LDH: 217 U/L — ABNORMAL HIGH (ref 98–192)

## 2019-07-11 MED ORDER — MAGIC MOUTHWASH W/LIDOCAINE
10.0000 mL | Freq: Three times a day (TID) | ORAL | Status: DC
  Administered 2019-07-11 – 2019-07-14 (×9): 10 mL via ORAL
  Filled 2019-07-11 (×11): qty 10

## 2019-07-11 MED ORDER — FUROSEMIDE 10 MG/ML IJ SOLN
20.0000 mg | Freq: Once | INTRAMUSCULAR | Status: AC
  Administered 2019-07-11: 20 mg via INTRAVENOUS
  Filled 2019-07-11: qty 2

## 2019-07-11 MED ORDER — LIDOCAINE VISCOUS HCL 2 % MT SOLN
15.0000 mL | Freq: Four times a day (QID) | OROMUCOSAL | Status: DC | PRN
  Administered 2019-07-12 – 2019-07-14 (×2): 15 mL via OROMUCOSAL
  Filled 2019-07-11 (×3): qty 15

## 2019-07-11 MED ORDER — ENSURE ENLIVE PO LIQD
237.0000 mL | Freq: Three times a day (TID) | ORAL | Status: DC
  Administered 2019-07-11 – 2019-07-14 (×6): 237 mL via ORAL

## 2019-07-11 MED ORDER — ADULT MULTIVITAMIN W/MINERALS CH
1.0000 | ORAL_TABLET | Freq: Every day | ORAL | Status: DC
  Administered 2019-07-11 – 2019-07-14 (×3): 1 via ORAL
  Filled 2019-07-11 (×3): qty 1

## 2019-07-11 MED ORDER — NYSTATIN 100000 UNIT/GM EX POWD
Freq: Three times a day (TID) | CUTANEOUS | Status: DC
  Filled 2019-07-11 (×2): qty 15

## 2019-07-11 MED ORDER — SODIUM CHLORIDE 0.9% IV SOLUTION: Freq: Once | INTRAVENOUS | Status: AC

## 2019-07-11 MED ORDER — BIOTENE DRY MOUTH MT LIQD: 15.0000 mL | OROMUCOSAL | Status: DC | PRN

## 2019-07-11 MED ORDER — POTASSIUM CHLORIDE CRYS ER 20 MEQ PO TBCR
40.0000 meq | EXTENDED_RELEASE_TABLET | ORAL | Status: AC
  Administered 2019-07-11 (×2): 40 meq via ORAL
  Filled 2019-07-11 (×2): qty 2

## 2019-07-11 MED ORDER — PNEUMOCOCCAL VAC POLYVALENT 25 MCG/0.5ML IJ INJ: 0.5000 mL | INJECTION | INTRAMUSCULAR | Status: DC

## 2019-07-11 MED ORDER — MAGNESIUM SULFATE IN D5W 1-5 GM/100ML-% IV SOLN
1.0000 g | Freq: Once | INTRAVENOUS | Status: AC
  Administered 2019-07-11: 1 g via INTRAVENOUS
  Filled 2019-07-11: qty 100

## 2019-07-11 NOTE — Progress Notes (Signed)
Initial Nutrition Assessment  DOCUMENTATION CODES:   Obesity unspecified  INTERVENTION:   -Downgrade diet to dysphagia 3 (advanced mechanical soft diet) for ease of intake -MVI with minerals daily -Ensure Enlive po TID, each supplement provides 350 kcal and 20 grams of protein  NUTRITION DIAGNOSIS:   Inadequate oral intake related to decreased appetite, early satiety as evidenced by per patient/family report.  GOAL:   Patient will meet greater than or equal to 90% of their needs  MONITOR:   PO intake, Supplement acceptance, Labs, Weight trends, Skin, I & O's  REASON FOR ASSESSMENT:   Malnutrition Screening Tool    ASSESSMENT:   Kristy Dougherty is a 69 y.o. female with medical history significant of Psoriatic arthritis on MTX, DM2, HTN, CKD stage 3-4 follows with Dr. [name].Pt presents to the ED with c/o several weeks to month+ history of progressive generalized weakness, worsening difficulty with ADLs.  Pt admitted with pancytopenia.   Reviewed I/O's: +1.1 L x 24 hours  Spoke with pt and daughter at bedside. Pt reports she has not eaten anything today,as diet just got advanced. Pt and daughter Baron Sane that pt has  Had decreased oral intake over the past 6 week secondary to early satiety and reflux as a result from ulcers. Pt reports that she usually consumes 2 meals per day, however, meal intake has decreased significantly over the past 2 weeks. PTA, pt reports it took her 3 days to consume a can of soup.   Observed pt with several missing teeth- pt with no top teeth. Pt explains that she has developed sores on her gums, which are further irritated by her dentures. It is often difficult for her to chew because of this; she is amenable to a mechanically downgraded diet.   Reviewed wt hx; pt has experienced a 4.5% wt loss over the past 6 months, which is not significant for time frame. Per pt, her UBW is around 211#.   Discussed importance of good meal and supplement intake  to promote healing. Pt amenable to Ensure supplements.  Pt with poor oral intake and would benefit from nutrient dense supplement. One Ensure Enlive supplement provides 350 kcals, 20 grams protein, and 44-45 grams of carbohydrate vs one Glucerna shake supplement, which provides 220 kcals, 10 grams of protein, and 26 grams of carbohydrate. Given pt's hx of DM, RD will reassess adequacy of PO intake, CBGS, and adjust supplement regimen as appropriate at follow-up.   Lab Results  Component Value Date   HGBA1C 6.0 (H) 07/11/2019   PTA DM medications are 45 units insulin NPH BID.   Labs reviewed: K: 2.8, CBGS: 125 (inpatient orders for glycemic control are 0-15 units insulin aspart TID with meals, 0-5 units insulin aspart q HS, and 6 units insulin NPH BID).   NUTRITION - FOCUSED PHYSICAL EXAM:    Most Recent Value  Orbital Region No depletion  Upper Arm Region No depletion  Thoracic and Lumbar Region No depletion  Buccal Region No depletion  Temple Region No depletion  Clavicle Bone Region No depletion  Clavicle and Acromion Bone Region No depletion  Scapular Bone Region No depletion  Dorsal Hand No depletion  Patellar Region No depletion  Anterior Thigh Region No depletion  Posterior Calf Region No depletion  Edema (RD Assessment) Mild  Hair Reviewed  Eyes Reviewed  Mouth Reviewed  Skin Reviewed  Nails Reviewed       Diet Order:   Diet Order  DIET DYS 3 Room service appropriate? Yes; Fluid consistency: Thin  Diet effective now                 EDUCATION NEEDS:   Education needs have been addressed  Skin:  Skin Assessment: Reviewed RN Assessment  Last BM:  07/07/2019  Height:   Ht Readings from Last 1 Encounters:  07/23/2019 5\' 9"  (1.753 m)    Weight:   Wt Readings from Last 1 Encounters:  07/21/2019 95.7 kg    Ideal Body Weight:  72.7 kg  BMI:  Body mass index is 31.16 kg/m.  Estimated Nutritional Needs:   Kcal:  2000-2200  Protein:  110-125  grams  Fluid:  > 2 L    Loistine Chance, RD, LDN, [place] Registered Dietitian II Certified Diabetes Care and Education Specialist Please refer to [facility] for RD and/or RD on-call/weekend/after hours pager

## 2019-07-11 NOTE — Consult Note (Addendum)
[place]  [phone] [phone]    [place]  Referring MD:  Dr. Bonnielee Haff  Reason for Referral: Pancytopenia  HPI: [name] is a 69 year old female with a past medical history significant for psoriatic arthritis currently on methotrexate, diabetes mellitus, hypertension, CKD stage III-IV.  The patient presented to the emergency room with generalized weakness.  Her weakness has been present for several weeks to 1 month and has progressive.  She also has difficulty performing ADLs.  She had a fall several weeks ago.  She also had right-sided chest pain that lasted about 2 weeks but has resolved.  She has a rash to her arms and legs and nonhealing ulcers secondary to psoriasis scabbing.  She has also had worsening lower extremity edema.  In the emergency room, her CBC showed a WBC of 1.5, hemoglobin 5.7, platelets 19,000, ANC 1.0, absolute lymphocytes 0.4, absolute monocyte 0.0, potassium 3.3, BUN 42, creatinine 2.62 (baseline was 1.4-1.9 in 2019).  Hepatic function panel performed this morning showed an albumin of 1.6, total bilirubin of 2.2, direct bilirubin 0.6, and indirect bilirubin of 1.6.  Her AST and ALT were normal.  Alkaline phosphatase was mildly elevated at 181.  She has been given 2 units PRBC so far this admission.  Her most recent labs available to me were performed on 01/29/2018 and at that time her WBC was 9.8, hemoglobin 10.4, and platelets were 261,000.  I also note that she had a hemoglobin down to 6.6 back on 11/07/2017.  It appears as though she was admitted for symptomatic anemia and it was recommended for her to follow-up with GI due to a history of peptic ulcer disease.  She had missed outpatient appointments.  Additional review of her records show that she had a right upper quadrant abdominal ultrasound performed on 05/28/2019 which showed cirrhotic hepatic morphology likely due to fatty  infiltration.  Today, the patient reports that she has had fatigue and generalized weakness for about a month.  She reports early satiety and thinks that she has lost weight but is uncertain as to how much weight she has lost.  She reports that she developed mouth sores and sores to her arms and legs about 3 weeks ago.  These are getting progressively worse.  Sores in her mouth are painful.  States that the sores on her legs bleed intermittently.  She has not noticed any epistaxis, bleeding gums, hematuria, melena, or hematochezia.  She reported an episode of chest pain a few weeks ago which has resolved.  She denies shortness of breath.  She denies abdominal pain, nausea, vomiting, constipation, diarrhea.  Patient reports that she has been on methotrexate for at least 10 years.  She states that she has been taking 17.5 mg weekly on Tuesdays for many years, but about 2 months ago her nephrologist reduced the dose to 10 mg weekly on Tuesdays due to worsening renal function.  Hematology was asked to see the patient to make recommendations regarding her pancytopenia.   Past Medical History:  Diagnosis Date   CKD (chronic kidney disease)    Stage 3-4: sees Dr. [name]   Diabetes mellitus without complication ([place])    Hyperlipemia    Hypertension    Normal coronary arteries 2011   Obesity    OSA on CPAP    Shingles July 2014  :    Past Surgical History:  Procedure Laterality Date   [place]  04/02/2009   Normal coronary arteries   US ECHOCARDIOGRAPHY  03/22/2009   EF =>55%,trace MR,mild TR & PI  :   CURRENT MEDS: Current Facility-Administered Medications  Medication Dose Route Frequency Provider Last Rate Last Admin   acetaminophen (TYLENOL) tablet 500-1,000 mg  500-1,000 mg Oral Q6H PRN Etta Quill, DO       albuterol (PROVENTIL) (2.5 MG/3ML) 0.083% nebulizer solution 3 mL  3 mL Inhalation Q6H PRN Etta Quill, DO       amLODipine  (NORVASC) tablet 10 mg  10 mg Oral Daily Jennette Kettle M, DO   10 mg at 07/11/19 0945   carvedilol (COREG) tablet 3.125 mg  3.125 mg Oral BID Etta Quill, DO   3.125 mg at 07/11/19 0944   cyclobenzaprine (FLEXERIL) tablet 5 mg  5 mg Oral TID PRN Etta Quill, DO       feeding supplement (ENSURE ENLIVE) (ENSURE ENLIVE) liquid 237 mL  237 mL Oral TID BM Bonnielee Haff, MD       fenofibrate tablet 160 mg  160 mg Oral Daily Jennette Kettle M, DO   160 mg at 07/11/19 0943   FLUoxetine (PROZAC) capsule 20 mg  20 mg Oral Daily Jennette Kettle M, DO   20 mg at 07/11/19 0944   fluticasone (FLONASE) 50 MCG/ACT nasal spray 1-2 spray  1-2 spray Each Nare Daily PRN Etta Quill, DO       folic acid (FOLVITE) tablet 1 mg  1 mg Oral Daily Jennette Kettle M, DO   1 mg at 07/11/19 0944   gabapentin (NEURONTIN) capsule 300 mg  300 mg Oral QHS Jennette Kettle M, DO   300 mg at 07/18/2019 2147   hydrALAZINE (APRESOLINE) tablet 100 mg  100 mg Oral Daily Carmin Muskrat, MD   100 mg at 07/11/19 0945   hydrALAZINE (APRESOLINE) tablet 75 mg  75 mg Oral QHS Jennette Kettle M, DO   75 mg at 07/13/2019 2147   insulin aspart (novoLOG) injection 0-15 Units  0-15 Units Subcutaneous TID WC Jennette Kettle M, DO       insulin aspart (novoLOG) injection 0-5 Units  0-5 Units Subcutaneous QHS Alcario Drought, Jared M, DO       insulin NPH Human (NOVOLIN N) injection 6 Units  6 Units Subcutaneous BID AC & HS Etta Quill, DO   6 Units at 07/11/19 8756   magic mouthwash w/lidocaine  10 mL Oral TID Bonnielee Haff, MD   10 mL at 07/11/19 0950   mometasone-formoterol (DULERA) 200-5 MCG/ACT inhaler 2 puff  2 puff Inhalation BID Etta Quill, DO   2 puff at 07/11/19 0810   multivitamin with minerals tablet 1 tablet  1 tablet Oral Daily Bonnielee Haff, MD       ondansetron [facility]) tablet 4 mg  4 mg Oral Q6H PRN Etta Quill, DO       Or   ondansetron [facility]) injection 4 mg  4 mg Intravenous Q6H PRN Etta Quill, DO        pantoprazole (PROTONIX) EC tablet 80 mg  80 mg Oral Daily Jennette Kettle M, DO   80 mg at 07/11/19 0944   [START ON 07/12/2019] pneumococcal 23 valent vaccine (PNEUMOVAX-23) injection 0.5 mL  0.5 mL Intramuscular Tomorrow-1000 Jennette Kettle M, DO       potassium chloride SA (KLOR-CON) CR tablet 40 mEq  40 mEq Oral Q4H Bonnielee Haff, MD   40 mEq at 07/11/19 0944   rosuvastatin (  CRESTOR) tablet 40 mg  40 mg Oral Daily Jennette Kettle M, DO   40 mg at 07/11/19 6144   torsemide (DEMADEX) tablet 20 mg  20 mg Oral BID Etta Quill, DO   20 mg at 07/11/19 0945   traMADol (ULTRAM) tablet 50 mg  50 mg Oral Q6H PRN Etta Quill, DO          No Known Allergies:  Family History  Problem Relation Age of Onset   Lung cancer Mother    Heart attack Father    :  Social History   Socioeconomic History   Marital status: Widowed    Spouse name: Not on file   Number of children: Not on file   Years of education: Not on file   Highest education level: Not on file  Occupational History   Not on file  Tobacco Use   Smoking status: Current Every Day Smoker    Packs/day: 1.00    Years: 40.00    Pack years: 40.00    Types: Cigarettes   Smokeless tobacco: Never Used  Vaping Use   Vaping Use: Never used  Substance and Sexual Activity   Alcohol use: No   Drug use: No   Sexual activity: Not on file  Other Topics Concern   Not on file  Social History Narrative   Not on file   Social Determinants of Health   Financial Resource Strain:    Difficulty of Paying Living Expenses:   Food Insecurity:    Worried About Charity fundraiser in the Last Year:    Arboriculturist in the Last Year:   Transportation Needs:    Film/video editor (Medical):    Lack of Transportation (Non-Medical):   Physical Activity:    Days of Exercise per Week:    Minutes of Exercise per Session:   Stress:    Feeling of Stress :   Social Connections:    Frequency of Communication with Friends and Family:     Frequency of Social Gatherings with Friends and Family:    Attends Religious Services:    Active Member of Clubs or Organizations:    Attends Music therapist:    Marital Status:   Intimate Partner Violence:    Fear of Current or Ex-Partner:    Emotionally Abused:    Physically Abused:    Sexually Abused:   :  REVIEW OF SYSTEMS: A comprehensive 14 point review of systems was negative except as noted in the HPI.  Exam: Patient Vitals for the past 24 hrs:  BP Temp Temp src Pulse Resp SpO2 Height Weight  07/11/19 0953 (!) 132/57 98.5 F (36.9 C) Oral 90 18 94 % -- --  07/11/19 0944 (!) 133/46 -- -- 83 -- -- -- --  07/11/19 0810 -- -- -- -- -- 90 % -- --  07/11/19 0431 (!) 150/46 98.5 F (36.9 C) Oral 81 20 100 % -- --  07/11/19 0226 (!) 138/58 99 F (37.2 C) Oral 82 20 96 % -- --  07/11/19 0017 (!) 138/50 98.5 F (36.9 C) Oral 73 20 97 % -- --  07/25/2019 2353 136/62 98.6 F (37 C) Oral 75 19 99 % -- --  07/24/2019 2350 136/62 98.6 F (37 C) -- 74 20 -- -- --  07/13/2019 2248 (!) 137/47 97.9 F (36.6 C) Oral 77 19 -- -- --  07/17/2019 2230 (!) 156/56 -- -- 74 16 98 % -- --  07/01/2019  2223 -- -- -- 77 17 99 % -- --  07/17/2019 2208 -- -- -- 75 16 100 % -- --  07/12/2019 2153 -- -- -- 83 19 98 % -- --  07/23/2019 2138 -- -- -- 75 19 95 % -- --  07/07/2019 2123 -- -- -- 73 18 96 % -- --  07/19/2019 2108 -- -- -- 76 18 95 % -- --  07/05/2019 2053 -- -- -- 71 18 96 % -- --  07/30/2019 2038 -- -- -- 74 15 98 % -- --  07/23/2019 2023 -- -- -- 72 17 99 % -- --  07/24/2019 2009 (!) 152/86 97.7 F (36.5 C) Oral 76 18 -- -- --  07/17/2019 2008 (!) 152/86 -- -- 79 18 99 % -- --  07/20/2019 1955 -- -- -- 75 16 99 % -- --  07/14/2019 1954 -- -- -- 77 12 98 % -- --  07/19/2019 1953 -- -- -- 78 17 100 % -- --  07/29/2019 1952 -- -- -- 80 19 98 % -- --  07/06/2019 1951 -- -- -- 81 20 100 % -- --  07/09/2019 1950 -- -- -- 79 17 99 % -- --  07/14/2019 1949 (!) 147/54 98.3 F (36.8 C) Oral 79 16 -- -- --   07/29/2019 1948 -- -- -- 79 18 97 % -- --  07/09/2019 1947 -- -- -- 82 18 97 % -- --  07/07/2019 1946 (!) 147/54 -- -- 80 18 98 % -- --  07/21/2019 1945 -- -- -- 78 20 99 % -- --  07/06/2019 1944 -- -- -- 78 18 98 % -- --  07/16/2019 1943 -- -- -- 76 18 96 % -- --  07/18/2019 1942 -- -- -- 78 17 96 % -- --  07/23/2019 1941 -- -- -- 76 19 97 % -- --  07/13/2019 1940 -- -- -- 79 16 97 % -- --  07/19/2019 1939 -- -- -- 78 20 98 % -- --  07/07/2019 1938 -- -- -- 78 16 98 % -- --  07/28/2019 1937 -- -- -- 79 (!) 21 97 % -- --  07/17/2019 1936 -- -- -- 74 19 97 % -- --  07/06/2019 1935 -- -- -- 80 (!) 28 98 % -- --  07/28/2019 1934 -- -- -- 88 (!) 23 97 % -- --  07/04/2019 1933 -- -- -- 77 18 94 % -- --  07/20/2019 1932 -- -- -- 77 18 94 % -- --  07/11/2019 1931 -- -- -- 78 19 95 % -- --  07/29/2019 1930 -- -- -- 76 18 95 % -- --  07/07/2019 1929 -- -- -- 76 16 97 % -- --  07/16/2019 1928 -- -- -- 79 18 97 % -- --  07/28/2019 1927 -- -- -- 75 16 98 % -- --  07/24/2019 1926 -- -- -- 77 17 97 % -- --  07/25/2019 1925 -- -- -- 76 18 97 % -- --  07/29/2019 1924 -- -- -- 76 15 98 % -- --  07/20/2019 1923 -- -- -- 77 15 97 % -- --  07/29/2019 1922 -- -- -- 77 14 98 % -- --  07/28/2019 1921 -- -- -- 79 14 97 % -- --  07/05/2019 1920 -- -- -- 79 17 97 % -- --  07/27/2019 1919 -- -- -- 80 18 99 % -- --  07/14/2019 1918 -- -- -- 77 14 97 % -- --  07/30/2019  1917 -- -- -- 78 16 99 % -- --  07/01/2019 1916 -- -- -- -- 18 -- -- --  07/07/2019 1915 -- -- -- 84 (!) 21 97 % -- --  07/30/2019 1914 -- -- -- -- (!) 30 -- -- --  07/01/2019 1913 -- -- -- 78 20 98 % -- --  07/09/2019 1912 -- -- -- 74 17 99 % -- --  07/08/2019 1911 -- -- -- 77 19 98 % -- --  07/08/2019 1910 -- -- -- -- 13 -- -- --  07/07/2019 1909 -- -- -- 76 15 99 % -- --  07/05/2019 1908 -- -- -- 77 15 97 % -- --  07/25/2019 1907 -- -- -- 80 16 100 % -- --  07/26/2019 1906 -- -- -- 78 (!) 22 98 % -- --  07/12/2019 1905 -- -- -- 78 14 99 % -- --  07/22/2019 1904 -- -- -- 77 20 100 % -- --  07/04/2019 1903 -- -- -- -- 15  -- -- --  07/04/2019 1902 -- -- -- -- 20 -- -- --  07/17/2019 1901 -- -- -- -- 13 -- -- --  07/26/2019 1900 -- -- -- 76 18 97 % -- --  07/04/2019 1859 -- -- -- 76 18 100 % -- --  07/13/2019 1858 -- -- -- 78 16 100 % -- --  07/28/2019 1857 -- -- -- 79 18 100 % -- --  07/28/2019 1856 -- -- -- 80 19 99 % -- --  07/06/2019 1855 -- -- -- 79 18 100 % -- --  07/29/2019 1854 -- -- -- -- 16 -- -- --  07/28/2019 1853 -- -- -- -- 18 -- -- --  07/26/2019 1852 -- -- -- -- 14 -- -- --  07/11/2019 1851 -- -- -- -- 16 -- -- --  07/19/2019 1850 -- -- -- -- 14 -- -- --  07/30/2019 1849 -- -- -- -- 16 -- -- --  07/25/2019 1848 -- -- -- -- 19 -- -- --  07/06/2019 1847 -- -- -- -- 17 -- -- --  07/28/2019 1846 -- -- -- 77 16 98 % -- --  07/14/2019 1845 -- -- -- 77 12 98 % -- --  07/22/2019 1844 -- -- -- 76 15 99 % -- --  07/07/2019 1843 -- -- -- -- 13 -- -- --  07/30/2019 1842 -- -- -- 72 12 100 % -- --  07/17/2019 1841 -- -- -- 73 14 99 % -- --  07/26/2019 1840 -- -- -- 74 19 99 % -- --  07/24/2019 1839 -- -- -- 75 14 99 % -- --  07/22/2019 1838 -- -- -- 77 12 100 % -- --  07/06/2019 1837 -- -- -- 78 17 97 % -- --  07/23/2019 1836 -- -- -- 76 16 98 % -- --  07/25/2019 1835 -- -- -- 75 16 100 % -- --  07/23/2019 1834 -- -- -- 77 15 100 % -- --  07/17/2019 1833 -- -- -- 78 15 100 % -- --  07/24/2019 1832 -- -- -- -- (!) 21 -- -- --  07/06/2019 1831 (!) 152/35 -- -- 76 18 100 % -- --  07/08/2019 1830 -- -- -- 73 (!) 23 100 % -- --  07/09/2019 1829 -- -- -- 76 18 100 % -- --  07/25/2019 1828 -- -- -- 77 12 99 % -- --  07/22/2019 1827 -- -- -- 77 (!) 22 97 % -- --  07/07/2019 1826 -- -- -- 76 14 99 % -- --  07/13/2019 1825 -- -- -- 77 18 98 % -- --  07/24/2019 1824 -- -- -- 74 14 98 % -- --  07/13/2019 1823 -- -- -- 76 17 99 % -- --  07/26/2019 1822 -- -- -- 79 (!) 9 98 % -- --  07/02/2019 1820 -- -- -- 80 -- 100 % -- --  07/16/2019 1819 -- -- -- 76 -- 100 % -- --  07/20/2019 1818 -- -- -- 76 -- 100 % -- --  07/23/2019 1817 -- -- -- 75 -- 100 % -- --  07/30/2019 1816 -- -- -- 77 -- 100  % -- --  07/04/2019 1815 -- -- -- 74 -- 100 % -- --  07/09/2019 1814 -- -- -- 75 -- 100 % -- --  07/24/2019 1813 -- -- -- 77 -- 100 % -- --  07/22/2019 1812 -- -- -- 77 -- 98 % -- --  07/22/2019 1811 -- -- -- 74 -- 98 % -- --  07/11/2019 1810 -- -- -- 74 -- 99 % -- --  07/21/2019 1809 -- -- -- 84 -- 99 % -- --  07/20/2019 1808 -- -- -- 77 -- 100 % -- --  07/25/2019 1807 -- -- -- 73 -- 100 % -- --  07/06/2019 1806 -- -- -- 74 -- 100 % -- --  07/01/2019 1805 -- -- -- 75 -- 100 % -- --  07/21/2019 1804 -- -- -- 73 -- 100 % -- --  07/27/2019 1803 -- -- -- 74 -- 99 % -- --  07/19/2019 1802 -- -- -- 66 -- 95 % -- --  07/30/2019 1800 (!) 149/64 -- -- 75 -- 99 % -- --  07/17/2019 1759 -- -- -- 75 -- 100 % -- --  07/27/2019 1758 -- -- -- 73 -- 100 % -- --  07/09/2019 1757 -- -- -- 76 -- 99 % -- --  07/11/2019 1756 -- -- -- 71 -- 100 % -- --  07/25/2019 1755 -- -- -- 71 -- 100 % -- --  07/25/2019 1754 -- -- -- 73 -- 100 % -- --  07/22/2019 1753 -- -- -- 73 -- 100 % -- --  07/18/2019 1752 -- -- -- 75 -- (!) 85 % -- --  07/09/2019 1751 -- -- -- 73 -- 100 % -- --  07/17/2019 1750 -- -- -- 73 -- 99 % -- --  07/17/2019 1749 -- -- -- 78 -- 100 % -- --  07/08/2019 1748 -- -- -- 71 -- 100 % -- --  07/06/2019 1747 (!) 135/50 -- -- 74 -- 100 % -- --  07/25/2019 1746 -- -- -- 72 -- 99 % -- --  07/03/2019 1745 -- -- -- 72 -- 100 % -- --  07/16/2019 1744 -- -- -- 76 -- 100 % -- --  07/27/2019 1741 (!) 152/48 97.6 F (36.4 C) Oral 77 (!) 22 98 % -- --  07/24/2019 1740 (!) 152/48 -- -- -- -- -- -- --  07/13/2019 1607 -- -- -- -- -- -- _0  (1.753 m) 95.7 kg  07/02/2019 1605 -- -- -- -- -- 99 % -- --  07/09/2019 1602 (!) 115/55 97.9 F (36.6 C) Oral 77 14 97 % -- --    General: The patient is awake and alert, no distress, appears fatigued Eyes:  no scleral icterus.   ENT: Stomatitis noted with dried blood -there was no active bleeding  Lymphatics:  Negative cervical, supraclavicular or axillary adenopathy.   Respiratory: lungs were clear bilaterally without wheezing  or crackles.   Cardiovascular:  Regular rate and rhythm, S1/S2, without murmur, rub or gallop.  Trace pedal edema noted. GI:  abdomen was soft, flat, nontender, nondistended, without organomegaly.  Musculoskeletal: Moves all extremities x4.   Skin: Multiple ecchymoses on her bilateral arms and legs.  Her legs were wrapped but family member showed me a picture on her telephone which showed multiple open wounds on her legs.  She also has scabbed areas on her bilateral arms. Neuro exam was nonfocal.  The patient is alert and oriented x3.  Attention was good.   Language was appropriate.  Mood was normal without depression.  Speech was not pressured.  Thought content was not tangential.    LABS:  Lab Results  Component Value Date   WBC 1.5 (L) 07/23/2019   WBC 1.6 (L) 07/29/2019   HGB 6.9 (LL) 07/11/2019   HCT 20.5 (L) 07/11/2019   PLT 19 (LL) 07/08/2019   PLT 20 (LL) 07/27/2019   GLUCOSE 115 (H) 07/11/2019   ALT 17 07/11/2019   AST 25 07/11/2019   NA 139 07/11/2019   K 2.8 (L) 07/11/2019   CL 102 07/11/2019   CREATININE 2.53 (H) 07/11/2019   BUN 41 (H) 07/11/2019   CO2 26 07/11/2019   INR 1.18 11/07/2017   HGBA1C 6.0 (H) 07/11/2019    DG Chest Port 1 View  Result Date: 07/18/2019 CLINICAL DATA:  Generalized weakness for 3 weeks EXAM: PORTABLE CHEST 1 VIEW COMPARISON:  11/07/2017 FINDINGS: Single frontal view of the chest demonstrates a stable cardiac silhouette. No airspace disease, effusion, or pneumothorax. Minimally displaced right posterior seventh rib fracture is noted. IMPRESSION: 1. Minimally displaced right posterior seventh rib fracture. 2. Otherwise no acute intrathoracic process. Electronically Signed   By: Randa Ngo M.D.   On: 07/03/2019 19:17    ASSESSMENT AND PLAN:  1.  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  Psoriatic arthritis 3.  Chronic kidney disease 4.  Hypertension 5.  Diabetes mellitus  -Based on the patient's constellation of symptoms including  pancytopenia, stomatitis, and skin ulcerations, pancytopenia likely due to methotrexate toxicity.  She has had recent worsening renal function and in fact had a dose reduction of her methotrexate about 2 months ago by her nephrologist.  Based on current labs, would not recommend a bone marrow biopsy at this time.  We can reconsider this if her counts do not improve. -Recommend supportive transfusions.  Transfuse PRBCs for hemoglobin less than 7.  Recommend platelet transfusion for platelet count less than 10,000 or active bleeding. -Anticipate that counts will be very slow to recover. -Management of other medical conditions per hospitalist.  Thank you for this referral.  Mikey Bussing, DNP, AGPCNP-BC, AOCNP Mon/Tues/Thurs/Fri 7am-5pm; Off Wednesdays Cell: [phone]  Attending Note  I personally saw the patient, reviewed the chart and examined the patient. The plan of care was discussed with the patient . I agree with the assessment and plan as documented above. Thank you very much for the consultation. Pancytopenia: Secondary to methotrexate with renal failure.  This has been reported in literature even if the dosage of the methotrexate is quite low. I agree with continuation of folic acid supplement. Please continue with supportive care. Patient does not require a bone marrow biopsy at this time.  Sometimes the pancytopenia may last over a week depending on the renal function.

## 2019-07-11 NOTE — Progress Notes (Signed)
   07/01/2019 2353  Vitals  Temp 98.6 F (37 C)  Temp Source Oral  BP 136/62  MAP (mmHg) 84  BP Location Right Arm  BP Method Automatic  Patient Position (if appropriate) Lying  Pulse Rate 75  Resp 19  Level of Consciousness  Level of Consciousness Alert  Oxygen Therapy  SpO2 99 %  O2 Device Room Air  Pain Assessment  Pain Scale 0-10  Pain Score 0  Received patient here in 6N, alert oriented x4 with vital signs stable shown above. Patient came with multiple scattered lesions all over her body most especially her bilateral lower and upper extremities, cleansed BL LE and wrapped with xeroform then abdominal pad, tried to cover with kerlix but patient c/o of too much pain when touched. Patient received one unit of PRBC in the ED and the second one will be transfused by this RN. Oriented to room and bed controls, situated in bed with call bell at reach and will continue to monitor patient with remainder of shift.

## 2019-07-11 NOTE — Progress Notes (Addendum)
[facility] PROGRESS NOTE   Kristy Dougherty [id number] DOB: 09-30-1950 DOA: 07/11/2019  PCP: [name], MD  Brief History/Interval Summary: 69 y.o. female with medical history significant of Psoriatic arthritis on MTX, DM2, HTN, CKD stage 3-4 follows with Dr. [name].  Presented to the ED with several week complaint of progressively worsening and difficulty with ADLs.  No focal weakness.  Apparently also had a fall a few weeks ago.  She also developed open wounds on her legs a few weeks ago.  Patient was found to have severe pancytopenia.  PRBC transfusion was ordered.  She was hospitalized for further management.   Reason for Visit: Pancytopenia  Consultants: Hematology  Procedures: transfusion of 2 units of PRBC  Antibiotics: Anti-infectives (From admission, onward)   None      Subjective/Interval History: Patient states that she still feels quite fatigued.  Complains of sores in her mouth.  Denies any abdominal pain.  Some nausea but no vomiting.  Denies any shortness of breath.  ROS: Denies any headaches    Assessment/Plan:  Pancytopenia Admitting physician discussed with Dr. Lindi Adie with hematology.  It is felt that her pancytopenia could be due to methotrexate.  This is being held.  She was transfused 2 units of blood.  Hemoglobin is up to 6.9.  Hematology to see the patient in consultation.  She may need bone marrow biopsy.  Her AST ALT are okay.  Her total bilirubin is 2.2.  Direct is 0.6 and indirect is 1.6.  Do not see LDH.  HIV screen is nonreactive.  Platelet count was 20,000.  We will recheck later today.  May need additional blood transfusion.  Chronic kidney disease stage III-IV/hyperkalemia Renal function seems to be close to her baseline.  Monitor urine output.  Avoid nephrotoxic agents.  Replace potassium.  Still noted to be hypomagnesemic.  This will also be corrected.  Hold the torsemide for now.  History of psoriasis with psoriatic  arthritis On methotrexate for same.  This is being held.  Bilateral leg wounds Reason for these are not entirely clear.  Patient denies any history of chronic venous stasis.  Wound care has been ordered.  Diabetes mellitus type 2 with chronic kidney disease On 70/30 insulin at home.  Currently on NPH twice a day.  Monitor CBGs.  HbA1c is 6.0  Essential hypertension Continue to monitor blood pressures closely.  Noted to be on hydralazine, amlodipine and carvedilol.  Fracture of the right seventh rib This likely occurred when she fell a few weeks ago.  Obesity Estimated body mass index is 31.16 kg/m as calculated from the following:   Height as of this encounter: _0  (1.753 m).   Weight as of this encounter: 95.7 kg.   DVT Prophylaxis: Cannot use heparin products due to thrombocytopenia.  Cannot use SCDs due to significant leg wounds. Code Status: Full code Family Communication: No family at bedside Disposition Plan:  Status is: Inpatient  Remains inpatient appropriate because:Persistent severe electrolyte disturbances, IV treatments appropriate due to intensity of illness or inability to take PO and Inpatient level of care appropriate due to severity of illness   Dispo: The patient is from: Home              Anticipated d/c is to: To be determined              Anticipated d/c date is: 3 days              Patient currently is  not medically stable to d/c.     Medications:  Scheduled: . amLODipine  10 mg Oral Daily  . carvedilol  3.125 mg Oral BID  . feeding supplement (ENSURE ENLIVE)  237 mL Oral TID BM  . fenofibrate  160 mg Oral Daily  . FLUoxetine  20 mg Oral Daily  . folic acid  1 mg Oral Daily  . gabapentin  300 mg Oral QHS  . hydrALAZINE  100 mg Oral Daily  . hydrALAZINE  75 mg Oral QHS  . insulin aspart  0-15 Units Subcutaneous TID WC  . insulin aspart  0-5 Units Subcutaneous QHS  . insulin NPH Human  6 Units Subcutaneous BID AC & HS  . magic mouthwash  w/lidocaine  10 mL Oral TID  . mometasone-formoterol  2 puff Inhalation BID  . multivitamin with minerals  1 tablet Oral Daily  . pantoprazole  80 mg Oral Daily  . [START ON 07/12/2019] pneumococcal 23 valent vaccine  0.5 mL Intramuscular Tomorrow-1000  . potassium chloride  40 mEq Oral Q4H  . rosuvastatin  40 mg Oral Daily  . torsemide  20 mg Oral BID   Continuous:  PIR:JJOACZYSAYTKZ, albuterol, cyclobenzaprine, fluticasone, ondansetron **OR** ondansetron (ZOFRAN) IV, traMADol   Objective:  Vital Signs  Vitals:   07/11/19 0431 07/11/19 0810 07/11/19 0944 07/11/19 0953  BP: (!) 150/46  (!) 133/46 (!) 132/57  Pulse: 81  83 90  Resp: 20   18  Temp: 98.5 F (36.9 C)   98.5 F (36.9 C)  TempSrc: Oral   Oral  SpO2: 100% 90%  94%  Weight:      Height:        Intake/Output Summary (Last 24 hours) at 07/11/2019 1127 Last data filed at 07/11/2019 0900 Gross per 24 hour  Intake 1305 ml  Output --  Net 1305 ml   Filed Weights   07/07/2019 1607  Weight: 95.7 kg    General appearance: Awake alert.  In no distress.  Mildly distracted Resp: Clear to auscultation bilaterally.  Normal effort Cardio: S1-S2 is normal regular.  No S3-S4.  No rubs murmurs or bruit GI: Abdomen is soft.  Nontender nondistended.  Bowel sounds are present normal.  No masses organomegaly Extremities: Covered by dressing.   Neurologic:  No focal neurological deficits.    Lab Results:  Data Reviewed: I have personally reviewed following labs and imaging studies  CBC: Recent Labs  Lab 07/29/2019 1641 07/11/19 0647  WBC 1.6*  1.5*  --   NEUTROABS 1.0*  --   HGB 5.7*  5.7* 6.9*  HCT 17.8*  17.9* 20.5*  MCV 106.0*  107.2*  --   PLT 20*  19*  --     Basic Metabolic Panel: Recent Labs  Lab 07/01/2019 1641 07/11/19 0327 07/11/19 0647  NA 137 139  --   K 3.3* 2.8*  --   CL 103 102  --   CO2 23 26  --   GLUCOSE 129* 115*  --   BUN 42* 41*  --   CREATININE 2.62* 2.53*  --   CALCIUM 7.7* 7.6*   --   MG  --   --  1.3*    GFR: Estimated Creatinine Clearance: 26.6 mL/min (A) (by C-G formula based on SCr of 2.53 mg/dL (H)).  Liver Function Tests: Recent Labs  Lab 07/11/19 0327  AST 25  ALT 17  ALKPHOS 181*  BILITOT 2.2*  PROT 4.3*  ALBUMIN 1.6*     HbA1C: Recent Labs  07/11/19 0327  HGBA1C 6.0*    CBG: Recent Labs  Lab 07/12/2019 1825 07/11/19 0024 07/11/19 0748  GLUCAP 144* 125* 111*     Recent Results (from the past 240 hour(s))  SARS Coronavirus 2 by RT PCR (hospital order, performed in [facility] hospital lab) Nasopharyngeal Nasopharyngeal Swab     Status: None   Collection Time: 07/09/2019  6:31 PM   Specimen: Nasopharyngeal Swab  Result Value Ref Range Status   SARS Coronavirus 2 NEGATIVE NEGATIVE Final    Comment: (NOTE) SARS-CoV-2 target nucleic acids are NOT DETECTED.  The SARS-CoV-2 RNA is generally detectable in upper and lower respiratory specimens during the acute phase of infection. The lowest concentration of SARS-CoV-2 viral copies this assay can detect is 250 copies / mL. A negative result does not preclude SARS-CoV-2 infection and should not be used as the sole basis for treatment or other patient management decisions.  A negative result may occur with improper specimen collection / handling, submission of specimen other than nasopharyngeal swab, presence of viral mutation(s) within the areas targeted by this assay, and inadequate number of viral copies (<250 copies / mL). A negative result must be combined with clinical observations, patient history, and epidemiological information.  Fact Sheet for Patients:   StrictlyIdeas.no  Fact Sheet for Healthcare Providers: BankingDealers.co.za  This test is not yet approved or  cleared by the [place] FDA and has been authorized for detection and/or diagnosis of SARS-CoV-2 by FDA under an Emergency Use Authorization (EUA).  This EUA  will remain in effect (meaning this test can be used) for the duration of the COVID-19 declaration under Section 564(b)(1) of the Act, 21 U.S.C. section 360bbb-3(b)(1), unless the authorization is terminated or revoked sooner.  Performed at [facility], [place] [address] [place], [place] [address]       Radiology Studies: DG Chest Port 1 View  Result Date: 07/26/2019 CLINICAL DATA:  Generalized weakness for 3 weeks EXAM: PORTABLE CHEST 1 VIEW COMPARISON:  11/07/2017 FINDINGS: Single frontal view of the chest demonstrates a stable cardiac silhouette. No airspace disease, effusion, or pneumothorax. Minimally displaced right posterior seventh rib fracture is noted. IMPRESSION: 1. Minimally displaced right posterior seventh rib fracture. 2. Otherwise no acute intrathoracic process. Electronically Signed   By: Randa Ngo M.D.   On: 07/18/2019 19:17       LOS: 1 day   [facility] Pager on [email or web address]  07/11/2019, 11:27 AM

## 2019-07-11 NOTE — Consult Note (Signed)
WOC Nurse Consult Note: Reason for Consult:Patient with history of psoriasis and psoriatic arthritis (on methotrexate) has nonhealing lesions to legs and arms from scratching skin.  Nonhealing.  Wound type:inflammatory Pressure Injury POA:NA Measurement: scattered 1 cm nonintact lesions  Will implement Xeroform gauze to promote healing.  Wound TFT:DDUKG red Drainage (amount, consistency, odor) moderate serosanguinous   Periwound:dry skin with psoriatic plaques.  Dressing procedure/placement/frequency:Cleanse legs with soap and water and pat dry.  Apply Xeroform gauze to open wounds  Cover with ABD pads and kerlix.  Change daily.  Will not follow at this time.  Please re-consult if needed.  Domenic Moras MSN, RN, FNP-BC CWON Wound, Ostomy, Continence Nurse Pager [phone]

## 2019-07-12 LAB — COMPREHENSIVE METABOLIC PANEL
ALT: 17 U/L (ref 0–44)
AST: 29 U/L (ref 15–41)
Albumin: 1.7 g/dL — ABNORMAL LOW (ref 3.5–5.0)
Alkaline Phosphatase: 226 U/L — ABNORMAL HIGH (ref 38–126)
Anion gap: 10 (ref 5–15)
BUN: 46 mg/dL — ABNORMAL HIGH (ref 8–23)
CO2: 25 mmol/L (ref 22–32)
Calcium: 7.4 mg/dL — ABNORMAL LOW (ref 8.9–10.3)
Chloride: 105 mmol/L (ref 98–111)
Creatinine, Ser: 2.5 mg/dL — ABNORMAL HIGH (ref 0.44–1.00)
GFR calc Af Amer: 22 mL/min — ABNORMAL LOW (ref >60)
GFR calc non Af Amer: 19 mL/min — ABNORMAL LOW (ref >60)
Glucose, Bld: 116 mg/dL — ABNORMAL HIGH (ref 70–99)
Potassium: 3.4 mmol/L — ABNORMAL LOW (ref 3.5–5.1)
Sodium: 140 mmol/L (ref 135–145)
Total Bilirubin: 2.5 mg/dL — ABNORMAL HIGH (ref 0.3–1.2)
Total Protein: 4.8 g/dL — ABNORMAL LOW (ref 6.5–8.1)

## 2019-07-12 LAB — TYPE AND SCREEN
ABO/RH(D): B POS
Antibody Screen: NEGATIVE
Unit division: 0
Unit division: 0
Unit division: 0

## 2019-07-12 LAB — CBC WITH DIFFERENTIAL/PLATELET
Abs Immature Granulocytes: 0.05 10*3/uL (ref 0.00–0.07)
Abs Immature Granulocytes: 0.03 10*3/uL (ref 0.00–0.07)
Basophils Absolute: 0 10*3/uL (ref 0.0–0.1)
Basophils Absolute: 0 10*3/uL (ref 0.0–0.1)
Basophils Relative: 0 %
Basophils Relative: 0 %
Eosinophils Absolute: 0 10*3/uL (ref 0.0–0.5)
Eosinophils Absolute: 0 10*3/uL (ref 0.0–0.5)
Eosinophils Relative: 7 %
Eosinophils Relative: 0 %
HCT: 20.3 % — ABNORMAL LOW (ref 36.0–46.0)
HCT: 23 % — ABNORMAL LOW (ref 36.0–46.0)
Hemoglobin: 6.8 g/dL — CL (ref 12.0–15.0)
Hemoglobin: 7.9 g/dL — ABNORMAL LOW (ref 12.0–15.0)
Immature Granulocytes: 9 %
Immature Granulocytes: 15 %
Lymphocytes Relative: 64 %
Lymphocytes Relative: 70 %
Lymphs Abs: 0.4 10*3/uL — ABNORMAL LOW (ref 0.7–4.0)
Lymphs Abs: 0.1 10*3/uL — ABNORMAL LOW (ref 0.7–4.0)
MCH: 32.5 pg (ref 26.0–34.0)
MCH: 32.9 pg (ref 26.0–34.0)
MCHC: 33.5 g/dL (ref 30.0–36.0)
MCHC: 34.3 g/dL (ref 30.0–36.0)
MCV: 97.1 fL (ref 80.0–100.0)
MCV: 95.8 fL (ref 80.0–100.0)
Monocytes Absolute: 0 10*3/uL — ABNORMAL LOW (ref 0.1–1.0)
Monocytes Absolute: 0 10*3/uL — ABNORMAL LOW (ref 0.1–1.0)
Monocytes Relative: 2 %
Monocytes Relative: 0 %
Neutro Abs: 0.1 10*3/uL — ABNORMAL LOW (ref 1.7–7.7)
Neutro Abs: 0 10*3/uL — ABNORMAL LOW (ref 1.7–7.7)
Neutrophils Relative %: 18 %
Neutrophils Relative %: 15 %
Platelets: 8 10*3/uL — CL (ref 150–400)
Platelets: 16 10*3/uL — CL (ref 150–400)
RBC: 2.09 MIL/uL — ABNORMAL LOW (ref 3.87–5.11)
RBC: 2.4 MIL/uL — ABNORMAL LOW (ref 3.87–5.11)
RDW: 19.9 % — ABNORMAL HIGH (ref 11.5–15.5)
RDW: 18.3 % — ABNORMAL HIGH (ref 11.5–15.5)
WBC: 0.6 10*3/uL — CL (ref 4.0–10.5)
WBC: 0.2 10*3/uL — CL (ref 4.0–10.5)
nRBC: 0 % (ref 0.0–0.2)
nRBC: 0 % (ref 0.0–0.2)

## 2019-07-12 LAB — BPAM RBC
Blood Product Expiration Date: 202106242359
Blood Product Expiration Date: 202106252359
Blood Product Expiration Date: 202106262359
ISSUE DATE / TIME: 202106101938
ISSUE DATE / TIME: 202106102340
ISSUE DATE / TIME: 202106111951
Unit Type and Rh: 7300
Unit Type and Rh: 7300
Unit Type and Rh: 7300

## 2019-07-12 LAB — PREPARE PLATELET PHERESIS: Unit division: 0

## 2019-07-12 LAB — GLUCOSE, CAPILLARY
Glucose-Capillary: 104 mg/dL — ABNORMAL HIGH (ref 70–99)
Glucose-Capillary: 111 mg/dL — ABNORMAL HIGH (ref 70–99)
Glucose-Capillary: 160 mg/dL — ABNORMAL HIGH (ref 70–99)
Glucose-Capillary: 123 mg/dL — ABNORMAL HIGH (ref 70–99)

## 2019-07-12 LAB — CBC
HCT: 23.2 % — ABNORMAL LOW (ref 36.0–46.0)
Hemoglobin: 7.9 g/dL — ABNORMAL LOW (ref 12.0–15.0)
MCH: 33.1 pg (ref 26.0–34.0)
MCHC: 34.1 g/dL (ref 30.0–36.0)
MCV: 97.1 fL (ref 80.0–100.0)
Platelets: 13 10*3/uL — CL (ref 150–400)
RBC: 2.39 MIL/uL — ABNORMAL LOW (ref 3.87–5.11)
RDW: 18.1 % — ABNORMAL HIGH (ref 11.5–15.5)
WBC: 0.3 10*3/uL — CL (ref 4.0–10.5)
nRBC: 0 % (ref 0.0–0.2)

## 2019-07-12 LAB — BPAM PLATELET PHERESIS
Blood Product Expiration Date: 202106142359
ISSUE DATE / TIME: 202106111639
Unit Type and Rh: 5100

## 2019-07-12 LAB — MAGNESIUM: Magnesium: 1.5 mg/dL — ABNORMAL LOW (ref 1.7–2.4)

## 2019-07-12 LAB — FOLATE: Folate: 47.7 ng/mL (ref >5.9)

## 2019-07-12 LAB — VITAMIN B12: Vitamin B-12: 635 pg/mL (ref 180–914)

## 2019-07-12 MED ORDER — MAGNESIUM SULFATE 2 GM/50ML IV SOLN
2.0000 g | Freq: Once | INTRAVENOUS | Status: AC
  Administered 2019-07-12: 2 g via INTRAVENOUS
  Filled 2019-07-12: qty 50

## 2019-07-12 MED ORDER — SODIUM CHLORIDE 0.9 % IV SOLN
2.0000 g | INTRAVENOUS | Status: DC
  Administered 2019-07-12: 2 g via INTRAVENOUS
  Filled 2019-07-12: qty 20
  Filled 2019-07-12: qty 2

## 2019-07-12 MED ORDER — METRONIDAZOLE IN NACL 5-0.79 MG/ML-% IV SOLN
500.0000 mg | Freq: Three times a day (TID) | INTRAVENOUS | Status: DC
  Administered 2019-07-12 – 2019-07-13 (×2): 500 mg via INTRAVENOUS
  Filled 2019-07-12 (×2): qty 100

## 2019-07-12 MED ORDER — FUROSEMIDE 10 MG/ML IJ SOLN
INTRAMUSCULAR | Status: AC
  Filled 2019-07-12: qty 2

## 2019-07-12 MED ORDER — SODIUM CHLORIDE 0.9% IV SOLUTION: Freq: Once | INTRAVENOUS | Status: AC

## 2019-07-12 MED ORDER — POTASSIUM CHLORIDE CRYS ER 20 MEQ PO TBCR
40.0000 meq | EXTENDED_RELEASE_TABLET | Freq: Once | ORAL | Status: AC
  Administered 2019-07-12: 40 meq via ORAL
  Filled 2019-07-12: qty 2

## 2019-07-12 MED ORDER — PHENOL 1.4 % MT LIQD: 1.0000 | OROMUCOSAL | Status: DC | PRN

## 2019-07-12 MED ORDER — WHITE PETROLATUM EX OINT
TOPICAL_OINTMENT | CUTANEOUS | Status: AC
  Filled 2019-07-12: qty 28.35

## 2019-07-12 MED ORDER — FUROSEMIDE 10 MG/ML IJ SOLN
20.0000 mg | Freq: Once | INTRAMUSCULAR | Status: AC
  Administered 2019-07-12: 20 mg via INTRAVENOUS
  Filled 2019-07-12: qty 2

## 2019-07-12 MED ORDER — FUROSEMIDE 10 MG/ML IJ SOLN
20.0000 mg | Freq: Once | INTRAMUSCULAR | Status: AC
  Administered 2019-07-12: 20 mg via INTRAVENOUS

## 2019-07-12 NOTE — Progress Notes (Signed)
Pt refusing CPAP at this time I told her to call if and when she decided she wanted to wear for tonight.

## 2019-07-12 NOTE — Progress Notes (Addendum)
[facility] PROGRESS NOTE   Kristy Dougherty WUJ:[id number] DOB: March 25, 1950 DOA: 07/12/2019  PCP: [name], MD  Brief History/Interval Summary: 69 y.o. female with medical history significant of Psoriatic arthritis on MTX, DM2, HTN, CKD stage 3-4 follows with Dr. [name].  Presented to the ED with several week complaint of progressively worsening and difficulty with ADLs.  No focal weakness.  Apparently also had a fall a few weeks ago.  She also developed open wounds on her legs a few weeks ago.  Patient was found to have severe pancytopenia.  PRBC transfusion was ordered.  She was hospitalized for further management.   Reason for Visit: Pancytopenia  Consultants: Hematology  Procedures:  So far has been transfused 3 units of PRBC and 1 pack of platelets   Antibiotics: Anti-infectives (From admission, onward)   None      Subjective/Interval History: Patient noted to be slightly drowsy this morning but easily arousable.  Answering all questions appropriately.  Denies any pain but feels lousy.  Denies any bleeding from any site.  Mouth sores still hurt a lot.    ROS: Denies any headaches    Assessment/Plan:  Pancytopenia Reason for her severe pancytopenia is thought to be methotrexate toxicity.  Hematology/oncology is following.  Methotrexate has been held. Patient has so far been transfused with 3 units of PRBC and 1 unit of platelets.  Hemoglobin 7.9 today. Platelet count noted to be 16,000 this morning.  We will recheck it this afternoon.  No overt bleeding has been noted.   No active signs of infection.  If she starts spiking fever then may have to start antibacterials due to her neutropenia.  It may take many days for this patient's counts to recover.  Continue supportive care.  No indication for bone marrow at this time.  LFTs remained stable.  Bilirubin is stable.  LDH was 217.  HIV nonreactive.     Chronic kidney disease stage  III-IV/hypokalemia/hypomagnesemia Renal function seems to be close to her baseline.  Continue to monitor urine output.  Replace potassium.  Avoid nephrotoxic agents.  Replace magnesium as well.    History of psoriasis with psoriatic arthritis On methotrexate for same.  This is being held.  Bilateral leg wounds Methotrexate can cause skin necrosis.  So her wounds are most likely due to methotrexate as well.  Wound care nurse is following.     Diabetes mellitus type 2 with chronic kidney disease On 70/30 insulin at home.  Currently on NPH twice a day.  Monitor CBGs.  HbA1c is 6.0.  Cut back on the dose of her NPH due to low normal glucose levels.  We will actually hold it for now.  Essential hypertension Continue to monitor blood pressures closely.  Noted to be on hydralazine, amlodipine and carvedilol.  Blood pressure is reasonably well controlled.  Fracture of the right seventh rib This likely occurred when she fell a few weeks ago.  Obesity Estimated body mass index is 31.16 kg/m as calculated from the following:   Height as of this encounter: 5\' 9"  (1.753 m).   Weight as of this encounter: 95.7 kg.   DVT Prophylaxis: Cannot use heparin products due to thrombocytopenia.  Cannot use SCDs due to significant leg wounds.  Mobilize as much as possible. Code Status: Full code Family Communication: No family at bedside Disposition Plan:  Status is: Inpatient  Remains inpatient appropriate because:Persistent severe electrolyte disturbances, IV treatments appropriate due to intensity of illness or inability to take  PO and Inpatient level of care appropriate due to severity of illness   Dispo: The patient is from: Home              Anticipated d/c is to: To be determined              Anticipated d/c date is: 3 days              Patient currently is not medically stable to d/c.     Medications:  Scheduled: . carvedilol  3.125 mg Oral BID  . feeding supplement (ENSURE ENLIVE)  237 mL  Oral TID BM  . FLUoxetine  20 mg Oral Daily  . folic acid  1 mg Oral Daily  . gabapentin  300 mg Oral QHS  . hydrALAZINE  100 mg Oral Daily  . insulin aspart  0-15 Units Subcutaneous TID WC  . insulin aspart  0-5 Units Subcutaneous QHS  . insulin NPH Human  6 Units Subcutaneous BID AC & HS  . magic mouthwash w/lidocaine  10 mL Oral TID  . mometasone-formoterol  2 puff Inhalation BID  . multivitamin with minerals  1 tablet Oral Daily  . nystatin   Topical TID  . pantoprazole  80 mg Oral Daily  . pneumococcal 23 valent vaccine  0.5 mL Intramuscular Tomorrow-1000   Continuous:  LFY:BOFBPZWCHENID, albuterol, antiseptic oral rinse, cyclobenzaprine, fluticasone, lidocaine, ondansetron **OR** ondansetron (ZOFRAN) IV, traMADol   Objective:  Vital Signs  Vitals:   07/11/19 1940 07/11/19 2011 07/11/19 2241 07/12/19 0551  BP: (!) 142/52 (!) 145/53 138/73 (!) 148/64  Pulse: 80 80 90 (!) 105  Resp: 16 16 18 18   Temp: 98.4 F (36.9 C) 98.4 F (36.9 C) 98.7 F (37.1 C) 100 F (37.8 C)  TempSrc: Oral Oral Oral Oral  SpO2: 90% 91% 95% 91%  Weight:      Height:        Intake/Output Summary (Last 24 hours) at 07/12/2019 1012 Last data filed at 07/12/2019 0900 Gross per 24 hour  Intake 889.5 ml  Output 500 ml  Net 389.5 ml   Filed Weights   07/19/2019 1607  Weight: 95.7 kg    General appearance:  In no distress.  Somewhat drowsy but easily arousable. Resp: Clear to auscultation bilaterally.  Normal effort Cardio: S1-S2 is normal regular.  No S3-S4.  No rubs murmurs or bruit GI: Abdomen is soft.  Nontender nondistended.  Bowel sounds are present normal.  No masses organomegaly Extremities: Lower extremities covered with dressing. Neurologic: No obvious focal deficits noted.   Lab Results:  Data Reviewed: I have personally reviewed following labs and imaging studies  CBC: Recent Labs  Lab 07/04/2019 1641 07/11/19 0647 07/11/19 1501 07/12/19 0407  WBC 1.6*  1.5*  --  0.6*  0.2*  NEUTROABS 1.0*  --  0.1* 0.0*  HGB 5.7*  5.7* 6.9* 6.8* 7.9*  HCT 17.8*  17.9* 20.5* 20.3* 23.0*  MCV 106.0*  107.2*  --  97.1 95.8  PLT 20*  19*  --  8* 16*    Basic Metabolic Panel: Recent Labs  Lab 07/12/2019 1641 07/11/19 0327 07/11/19 0647 07/11/19 1501 07/12/19 0407  NA 137 139  --  138 140  K 3.3* 2.8*  --  3.6 3.4*  CL 103 102  --  105 105  CO2 23 26  --  26 25  GLUCOSE 129* 115*  --  149* 116*  BUN 42* 41*  --  44* 46*  CREATININE 2.62* 2.53*  --  2.54* 2.50*  CALCIUM 7.7* 7.6*  --  7.4* 7.4*  MG  --   --  1.3*  --  1.5*    GFR: Estimated Creatinine Clearance: 26.9 mL/min (A) (by C-G formula based on SCr of 2.5 mg/dL (H)).  Liver Function Tests: Recent Labs  Lab 07/11/19 0327 07/12/19 0407  AST 25 29  ALT 17 17  ALKPHOS 181* 226*  BILITOT 2.2* 2.5*  PROT 4.3* 4.8*  ALBUMIN 1.6* 1.7*     HbA1C: Recent Labs    07/11/19 0327  HGBA1C 6.0*    CBG: Recent Labs  Lab 07/11/19 0748 07/11/19 1206 07/11/19 1640 07/11/19 2203 07/12/19 0748  GLUCAP 111* 135* 127* 138* 104*     Recent Results (from the past 240 hour(s))  SARS Coronavirus 2 by RT PCR (hospital order, performed in [facility] hospital lab) Nasopharyngeal Nasopharyngeal Swab     Status: None   Collection Time: 07/21/2019  6:31 PM   Specimen: Nasopharyngeal Swab  Result Value Ref Range Status   SARS Coronavirus 2 NEGATIVE NEGATIVE Final    Comment: (NOTE) SARS-CoV-2 target nucleic acids are NOT DETECTED.  The SARS-CoV-2 RNA is generally detectable in upper and lower respiratory specimens during the acute phase of infection. The lowest concentration of SARS-CoV-2 viral copies this assay can detect is 250 copies / mL. A negative result does not preclude SARS-CoV-2 infection and should not be used as the sole basis for treatment or other patient management decisions.  A negative result may occur with improper specimen collection / handling, submission of specimen other than  nasopharyngeal swab, presence of viral mutation(s) within the areas targeted by this assay, and inadequate number of viral copies (<250 copies / mL). A negative result must be combined with clinical observations, patient history, and epidemiological information.  Fact Sheet for Patients:   StrictlyIdeas.no  Fact Sheet for Healthcare Providers: BankingDealers.co.za  This test is not yet approved or  cleared by the [place] FDA and has been authorized for detection and/or diagnosis of SARS-CoV-2 by FDA under an Emergency Use Authorization (EUA).  This EUA will remain in effect (meaning this test can be used) for the duration of the COVID-19 declaration under Section 564(b)(1) of the Act, 21 U.S.C. section 360bbb-3(b)(1), unless the authorization is terminated or revoked sooner.  Performed at [facility], [place] [address], [place], [place] [address]       Radiology Studies: DG Chest Port 1 View  Result Date: 07/23/2019 CLINICAL DATA:  Generalized weakness for 3 weeks EXAM: PORTABLE CHEST 1 VIEW COMPARISON:  11/07/2017 FINDINGS: Single frontal view of the chest demonstrates a stable cardiac silhouette. No airspace disease, effusion, or pneumothorax. Minimally displaced right posterior seventh rib fracture is noted. IMPRESSION: 1. Minimally displaced right posterior seventh rib fracture. 2. Otherwise no acute intrathoracic process. Electronically Signed   By: Randa Ngo M.D.   On: 07/12/2019 19:17       LOS: 2 days   [facility] Pager on [email or web address]  07/12/2019, 10:12 AM

## 2019-07-12 NOTE — Progress Notes (Signed)
1 unit of prbcs transfused as ordered

## 2019-07-13 ENCOUNTER — Inpatient Hospital Stay (HOSPITAL_COMMUNITY): Payer: Medicare Other

## 2019-07-13 DIAGNOSIS — J9601 Acute respiratory failure with hypoxia: Secondary | ICD-10-CM

## 2019-07-13 DIAGNOSIS — J9621 Acute and chronic respiratory failure with hypoxia: Secondary | ICD-10-CM

## 2019-07-13 DIAGNOSIS — A419 Sepsis, unspecified organism: Secondary | ICD-10-CM

## 2019-07-13 DIAGNOSIS — D61818 Other pancytopenia: Secondary | ICD-10-CM

## 2019-07-13 DIAGNOSIS — R6520 Severe sepsis without septic shock: Secondary | ICD-10-CM

## 2019-07-13 DIAGNOSIS — N184 Chronic kidney disease, stage 4 (severe): Secondary | ICD-10-CM

## 2019-07-13 DIAGNOSIS — J690 Pneumonitis due to inhalation of food and vomit: Secondary | ICD-10-CM

## 2019-07-13 LAB — CBC WITH DIFFERENTIAL/PLATELET
Abs Immature Granulocytes: 0.01 10*3/uL (ref 0.00–0.07)
Basophils Absolute: 0 10*3/uL (ref 0.0–0.1)
Basophils Relative: 0 %
Eosinophils Absolute: 0 10*3/uL (ref 0.0–0.5)
Eosinophils Relative: 12 %
HCT: 23.9 % — ABNORMAL LOW (ref 36.0–46.0)
Hemoglobin: 8 g/dL — ABNORMAL LOW (ref 12.0–15.0)
Immature Granulocytes: 4 %
Lymphocytes Relative: 68 %
Lymphs Abs: 0.2 10*3/uL — ABNORMAL LOW (ref 0.7–4.0)
MCH: 32.8 pg (ref 26.0–34.0)
MCHC: 33.5 g/dL (ref 30.0–36.0)
MCV: 98 fL (ref 80.0–100.0)
Monocytes Absolute: 0 10*3/uL — ABNORMAL LOW (ref 0.1–1.0)
Monocytes Relative: 4 %
Neutro Abs: 0 10*3/uL — ABNORMAL LOW (ref 1.7–7.7)
Neutrophils Relative %: 12 %
Platelets: 25 10*3/uL — CL (ref 150–400)
RBC: 2.44 MIL/uL — ABNORMAL LOW (ref 3.87–5.11)
RDW: 18.1 % — ABNORMAL HIGH (ref 11.5–15.5)
WBC: 0.3 10*3/uL — CL (ref 4.0–10.5)
nRBC: 0 % (ref 0.0–0.2)

## 2019-07-13 LAB — PREPARE PLATELET PHERESIS
Unit division: 0
Unit division: 0

## 2019-07-13 LAB — COMPREHENSIVE METABOLIC PANEL
ALT: 13 U/L (ref 0–44)
AST: 23 U/L (ref 15–41)
Albumin: 1.4 g/dL — ABNORMAL LOW (ref 3.5–5.0)
Alkaline Phosphatase: 145 U/L — ABNORMAL HIGH (ref 38–126)
Anion gap: 11 (ref 5–15)
BUN: 48 mg/dL — ABNORMAL HIGH (ref 8–23)
CO2: 26 mmol/L (ref 22–32)
Calcium: 7.6 mg/dL — ABNORMAL LOW (ref 8.9–10.3)
Chloride: 103 mmol/L (ref 98–111)
Creatinine, Ser: 2.43 mg/dL — ABNORMAL HIGH (ref 0.44–1.00)
GFR calc Af Amer: 23 mL/min — ABNORMAL LOW (ref >60)
GFR calc non Af Amer: 20 mL/min — ABNORMAL LOW (ref >60)
Glucose, Bld: 127 mg/dL — ABNORMAL HIGH (ref 70–99)
Potassium: 3.7 mmol/L (ref 3.5–5.1)
Sodium: 140 mmol/L (ref 135–145)
Total Bilirubin: 1.9 mg/dL — ABNORMAL HIGH (ref 0.3–1.2)
Total Protein: 4.7 g/dL — ABNORMAL LOW (ref 6.5–8.1)

## 2019-07-13 LAB — BLOOD GAS, ARTERIAL
Acid-Base Excess: 4 mmol/L — ABNORMAL HIGH (ref 0.0–2.0)
Bicarbonate: 27.3 mmol/L (ref 20.0–28.0)
Drawn by: 257081
FIO2: 100
O2 Saturation: 90.2 %
Patient temperature: 38.8
pCO2 arterial: 39.3 mmHg (ref 32.0–48.0)
pH, Arterial: 7.464 — ABNORMAL HIGH (ref 7.350–7.450)
pO2, Arterial: 62.8 mmHg — ABNORMAL LOW (ref 83.0–108.0)

## 2019-07-13 LAB — BPAM PLATELET PHERESIS
Blood Product Expiration Date: 202106142359
Blood Product Expiration Date: 202106152359
ISSUE DATE / TIME: 202106121446
ISSUE DATE / TIME: 202106122055
Unit Type and Rh: 600
Unit Type and Rh: 6200

## 2019-07-13 LAB — GLUCOSE, CAPILLARY
Glucose-Capillary: 113 mg/dL — ABNORMAL HIGH (ref 70–99)
Glucose-Capillary: 101 mg/dL — ABNORMAL HIGH (ref 70–99)
Glucose-Capillary: 86 mg/dL (ref 70–99)

## 2019-07-13 MED ORDER — ACETAMINOPHEN 10 MG/ML IV SOLN
1000.0000 mg | Freq: Four times a day (QID) | INTRAVENOUS | Status: AC | PRN
  Administered 2019-07-13 (×2): 1000 mg via INTRAVENOUS
  Filled 2019-07-13 (×3): qty 100

## 2019-07-13 MED ORDER — LEUCOVORIN CALCIUM INJECTION 100 MG
10.0000 mg/m2 | Freq: Four times a day (QID) | INTRAMUSCULAR | Status: DC
  Administered 2019-07-13 – 2019-07-14 (×6): 22 mg via INTRAVENOUS
  Filled 2019-07-13 (×14): qty 1.1

## 2019-07-13 MED ORDER — VANCOMYCIN HCL 2000 MG/400ML IV SOLN
2000.0000 mg | Freq: Once | INTRAVENOUS | Status: AC
  Administered 2019-07-13: 2000 mg via INTRAVENOUS
  Filled 2019-07-13: qty 400

## 2019-07-13 MED ORDER — FENTANYL CITRATE (PF) 100 MCG/2ML IJ SOLN
25.0000 ug | INTRAMUSCULAR | Status: AC | PRN
  Administered 2019-07-14 (×3): 50 ug via INTRAVENOUS
  Filled 2019-07-13 (×3): qty 2

## 2019-07-13 MED ORDER — GABAPENTIN 300 MG PO CAPS: 300.0000 mg | ORAL_CAPSULE | Freq: Every day | ORAL | Status: DC

## 2019-07-13 MED ORDER — SODIUM CHLORIDE 0.9 % IV SOLN
100.0000 mg | Freq: Two times a day (BID) | INTRAVENOUS | Status: DC
  Filled 2019-07-13 (×2): qty 100

## 2019-07-13 MED ORDER — PIPERACILLIN-TAZOBACTAM 3.375 G IVPB
3.3750 g | Freq: Three times a day (TID) | INTRAVENOUS | Status: DC
  Administered 2019-07-13 – 2019-07-14 (×4): 3.375 g via INTRAVENOUS
  Filled 2019-07-13 (×4): qty 50

## 2019-07-13 MED ORDER — FUROSEMIDE 10 MG/ML IJ SOLN
INTRAMUSCULAR | Status: AC
  Filled 2019-07-13: qty 4

## 2019-07-13 MED ORDER — FUROSEMIDE 10 MG/ML IJ SOLN
40.0000 mg | Freq: Once | INTRAMUSCULAR | Status: AC
  Administered 2019-07-13: 40 mg via INTRAVENOUS

## 2019-07-13 MED ORDER — VANCOMYCIN HCL IN DEXTROSE 1-5 GM/200ML-% IV SOLN
1000.0000 mg | INTRAVENOUS | Status: DC
  Administered 2019-07-14: 1000 mg via INTRAVENOUS
  Filled 2019-07-13: qty 200

## 2019-07-13 MED ORDER — LACTATED RINGERS IV SOLN: INTRAVENOUS | Status: AC

## 2019-07-13 MED ORDER — FLUOXETINE HCL 20 MG PO CAPS: 20.0000 mg | ORAL_CAPSULE | Freq: Every day | ORAL | Status: DC

## 2019-07-13 NOTE — Progress Notes (Signed)
Placing patient on continuous pulse ox, patient's O2 was 79-83% on 3L nasal canula. Patient alert and talking. O2 increased to 5L with humidity. Patient O2 remained at 86%. RRT notified and Dr. [place] Pink notified. non-rebreather placed on patient improving sats to 100%. New orders placed for STAT chest x-ray and ABG.  BP 111/69, HR 107, RR 20, temp rectally 101.8- IV tylenol order placed.  Will continue to monitor patient.   0511 RRT and Dr. Maryland Pink at patient's bedside.

## 2019-07-13 NOTE — Progress Notes (Signed)
This pt removed non re breather mask and desat to the 60s.  I was alerted by CCMD.  I put non rebreather mask back on pt and her o2sat went up to the 85-88% range.  MD made aware he ordered CPAP qhs and will consider bipap after further monitoring.  Will continue to monitor pt.

## 2019-07-13 NOTE — Consult Note (Addendum)
NAME:  Kristy Dougherty, MRN:  [id number], DOB:  1950/08/22, LOS: 3 ADMISSION DATE:  07/14/2019, CONSULTATION DATE:  07/13/19  REFERRING MD:  Maryland Pink - TRH , CHIEF COMPLAINT:  Malaise, non-healing leg wounds  Brief History   69 yo F with hx psoriatic arthritis on MTX, admitted with malaise and non-healing wounds,  found to be pancytopenic. Concern for MTX toxicity despite recent dose reduction.   History of present illness   69 yo F with PMH psoriatic arthritis on chronic MTX (dose reduced recently by nephrology due to CKD), CKD, HTN, DM2, HLD, OSA on CPAP who presented to ED 6/11 with CC weakness and non-healing leg wounds. Weakness has been progressive in nature x 1-1.5 months and has become significant enough to cause multiple recent falls, as well as worsening difficulty with ADLs. During ED evaluation, patient was noted to be pancytopenic, with WBC 1.5, Hgb 5.6, Plt 19-- no blasts noted.   Pt was transfused 2 PRBC and admitted to [facility] team for further management and care. On 6/11 Hematology was consulted for assistance managing pancytopenia, which was felt by heme to be most consistent with MTX toxicity and transfusion threshold recommendations were provided. On 6/13 pt developed increasing lethargy, new increased O2 requirement and some worsening of skin lesions. CXR revealed new R side opacity and pt started on vanc, zosyn for PNA.  PCCM was consulted regarding skin lesions, specifically if possible SJS.   Past Medical History   Psoriatic arthritis DM II CKD 3-4 HTN Prior tobacco use  OSA on CPAP HLD  Significant Hospital Events   6/13>PCCM consult for worsening rash -- new PNA 6/12> plt transfused for plt 13 6/11> Heme consult for pancytopenia  6/10> admitted to hospitalist team, 2 PRBC for hgb < 6  Consults:  Heme PCCM  Procedures:    Significant Diagnostic Tests:  6/13 CXR> New RLL opacity   Micro Data:  6/10 SARS Cov2> neg  6/13 BCx>>    Antimicrobials:  6/13  vanc> 6/13 zosyn>   Interim history/subjective:  O2 support decreased from 15L to 10L   Objective   Blood pressure (!) 143/78, pulse (!) 103, temperature 100 F (37.8 C), temperature source Axillary, resp. rate (!) 24, height 5\' 9"  (1.753 m), weight 95.7 kg, SpO2 100 %.        Intake/Output Summary (Last 24 hours) at 07/13/2019 1107 Last data filed at 07/13/2019 1103 Gross per 24 hour  Intake 581 ml  Output 1000 ml  Net -419 ml   Filed Weights   07/20/2019 1607  Weight: 95.7 kg    Examination: General: Chronically ill appearing older adult F, reclined in bed NAD  HENT: NCAT. Dry tongue, tacky mm. Scattered oral lesions some with petechiae. Some raw, open lesions. No blistering of lips.  Lungs: R sided rhonchi. L side clear. Shallow, unlabored respirations. Symmetrical chest expansion  Cardiovascular: RRR s1s2 no rgm cap refill < 3seconds Abdomen: Soft obese ndnt normoactive x4 Extremities: Symmetrical bulk and tone. No cyanosis or clubbing  Neuro: Lethargic, awakens to voice, following commands GU: Purewick collecting amber urine. No sloughing or blistering lesions of labia  Skin: Numerous areas of open, non-bleeding skin lesions including Pannus, Groin folds, over L Trapezius, BLE. BLE lesions are dressed in xeroform + kerlix. Scattered petechial rash over chest, abdomen BUE BLE.   Resolved Hospital Problem list     Assessment & Plan:   Psoriatic Arthritis -on chronic MTX P -holding MTX   Pancytopenia Stomatitis Skin lesions -Skin and  mucosal lesions are not typical in appearing for reactions such as SJS -I suspect this constellation of findings is most consistent with MTX toxicity.  P -Leucovorin is ordered. Recommend considering Glucarpidase for further treatment of suspected MTX toxicity -LR 75/hr  -Hematology has seen pt, agree with transfusion recommendations by heme (PRBC if Hgb <7, Plt if < 10,000 or with active bleeding -Intermittent small volume Ice chips  ok for oral comport if no s/sx aspirating.  -Magic mouthwash + lido   Acute respiratory failure with hypoxia -CXR most suspicious for budding PNA (aspiration vs CAP), TRALI less likely  P -supportive O2 for SpO2 > 92%. I reduced from 15L to 10L with SpO2 remaining 95% -IS -abx per primary  Other problems: CKD HTN HLD  DM2  Frequent Falls -per primary team    Best practice:  Diet: Ice chips as tolerated. NPO if s/sx aspirating  Pain/Anxiety/Delirium protocol (if indicated): APAP, ultram, MM + lido VAP protocol (if indicated): na DVT prophylaxis: na due to degree of thrombocytopenia, BLE wounds GI prophylaxis: protonix Glucose control: SSI Mobility: BR Code Status: DNR Family Communication: Patient, family updated at bedside 6/13 Disposition: SDU  Labs   CBC: Recent Labs  Lab 07/16/2019 1641 07/13/2019 1641 07/11/19 0647 07/11/19 1501 07/12/19 0407 07/12/19 1323 07/13/19 0340  WBC 1.6*  1.5*  --   --  0.6* 0.2* 0.3* 0.3*  NEUTROABS 1.0*  --   --  0.1* 0.0*  --  0.0*  HGB 5.7*  5.7*   < > 6.9* 6.8* 7.9* 7.9* 8.0*  HCT 17.8*  17.9*   < > 20.5* 20.3* 23.0* 23.2* 23.9*  MCV 106.0*  107.2*  --   --  97.1 95.8 97.1 98.0  PLT 20*  19*  --   --  8* 16* 13* 25*   < > = values in this interval not displayed.    Basic Metabolic Panel: Recent Labs  Lab 07/22/2019 1641 07/11/19 0327 07/11/19 0647 07/11/19 1501 07/12/19 0407 07/13/19 0340  NA 137 139  --  138 140 140  K 3.3* 2.8*  --  3.6 3.4* 3.7  CL 103 102  --  105 105 103  CO2 23 26  --  26 25 26   GLUCOSE 129* 115*  --  149* 116* 127*  BUN 42* 41*  --  44* 46* 48*  CREATININE 2.62* 2.53*  --  2.54* 2.50* 2.43*  CALCIUM 7.7* 7.6*  --  7.4* 7.4* 7.6*  MG  --   --  1.3*  --  1.5*  --    GFR: Estimated Creatinine Clearance: 27.7 mL/min (A) (by C-G formula based on SCr of 2.43 mg/dL (H)). Recent Labs  Lab 07/11/19 1501 07/12/19 0407 07/12/19 1323 07/13/19 0340  WBC 0.6* 0.2* 0.3* 0.3*    Liver Function  Tests: Recent Labs  Lab 07/11/19 0327 07/12/19 0407 07/13/19 0340  AST 25 29 23   ALT 17 17 13   ALKPHOS 181* 226* 145*  BILITOT 2.2* 2.5* 1.9*  PROT 4.3* 4.8* 4.7*  ALBUMIN 1.6* 1.7* 1.4*   No results for input(s): LIPASE, AMYLASE in the last 168 hours. No results for input(s): AMMONIA in the last 168 hours.  ABG    Component Value Date/Time   PHART 7.464 (H) 07/13/2019 0850   PCO2ART 39.3 07/13/2019 0850   PO2ART 62.8 (L) 07/13/2019 0850   HCO3 27.3 07/13/2019 0850   O2SAT 90.2 07/13/2019 0850     Coagulation Profile: No results for input(s): INR, PROTIME in the  last 168 hours.  Cardiac Enzymes: No results for input(s): CKTOTAL, CKMB, CKMBINDEX, TROPONINI in the last 168 hours.  HbA1C: Hgb A1c MFr Bld  Date/Time Value Ref Range Status  07/11/2019 03:27 AM 6.0 (H) 4.8 - 5.6 % Final    Comment:    (NOTE) Pre diabetes:          5.7%-6.4%  Diabetes:              >6.4%  Glycemic control for   <7.0% adults with diabetes     CBG: Recent Labs  Lab 07/12/19 0748 07/12/19 1119 07/12/19 1712 07/12/19 2104 07/13/19 0802  GLUCAP 104* 111* 160* 123* 113*    Review of Systems:   Largely obtained by family at bedside as pt difficulty speaking due to mouth pain  Endorses fatigue, malaise, fever. Denies chills, sick contacts Endorses joint pain, denies joint swelling, endorses myalgia Endorses psoriatic skin rash, peeling skin lesion, bleeding skin lesions Endorses bleeding oral lesions. Endorses difficulty eating due to pain Denies nvd, blood in stool  Endorses easy bruising, easy bleeding, spontaneous bruising.  Endorses increased thirst Endorses SOB. Denies cough, post-nasal drip   Past Medical History  She,  has a past medical history of CKD (chronic kidney disease), Diabetes mellitus without complication ([place]), Hyperlipemia, Hypertension, Normal coronary arteries (2011), Obesity, OSA on CPAP, Psoriatic arthritis [facility]), and Shingles (July 2014).   Surgical  History    Past Surgical History:  Procedure Laterality Date  . ABDOMINAL HYSTERECTOMY  1983  . CARDIAC CATHETERIZATION  04/02/2009   Normal coronary arteries  . US ECHOCARDIOGRAPHY  03/22/2009   EF =>55%,trace MR,mild TR & PI     Social History   reports that she has been smoking cigarettes. She has a 40.00 pack-year smoking history. She has never used smokeless tobacco. She reports that she does not drink alcohol and does not use drugs.   Family History   Her family history includes Heart attack in her father; Lung cancer in her mother.   Allergies No Known Allergies   Home Medications  Prior to Admission medications   Medication Sig Start Date End Date Taking? Authorizing Provider  acetaminophen (TYLENOL) 500 MG tablet Take 500-1,000 mg by mouth every 6 (six) hours as needed (for pain).   Yes [provider]  amLODipine (NORVASC) 10 MG tablet TAKE 1 TABLET BY MOUTH EVERY DAY Patient taking differently: Take 10 mg by mouth daily.  12/10/18  Yes Troy Sine, MD  budesonide-formoterol [facility]) 160-4.5 MCG/ACT inhaler Inhale 2 puffs into the lungs 2 (two) times daily.   Yes [provider]  carvedilol (COREG) 3.125 MG tablet Take 3.125 mg by mouth 2 (two) times daily. 02/28/18  Yes [provider]  cyclobenzaprine (FLEXERIL) 5 MG tablet Take 5 mg by mouth 3 (three) times daily as needed for muscle spasms.   Yes [provider]  fenofibrate 160 MG tablet Take 160 mg by mouth daily. 05/28/18  Yes [provider]  FLUoxetine (PROZAC) 20 MG capsule Take 20 mg by mouth daily.   Yes [provider]  fluticasone (FLONASE) 50 MCG/ACT nasal spray Place 1-2 sprays into both nostrils daily as needed for allergies or rhinitis.   Yes [provider]  folic acid (FOLVITE) 1 MG tablet Take 1 mg by mouth daily.   Yes [provider]  gabapentin (NEURONTIN) 100 MG capsule Take 300 mg by mouth at bedtime.  10/20/12  Yes [provider]  hydrALAZINE (APRESOLINE) 50 MG tablet  Take 2 tabs (100mg ) in the morning and 1.5 (75mg ) tablet in the pm Patient taking differently: Take 75 mg by mouth in the morning and at bedtime.  01/14/19  Yes Troy Sine, MD  insulin NPH-regular Human (70-30) 100 UNIT/ML injection Inject 45 Units into the skin 2 (two) times daily with a meal.   Yes [provider]  methotrexate (RHEUMATREX) 2.5 MG tablet Take 15 mg by mouth every Tuesday. Caution:Chemotherapy. Protect from light.   Yes [provider]  omeprazole (PRILOSEC) 40 MG capsule Take 1 capsule by mouth daily. 05/28/18  Yes [provider]  rosuvastatin (CRESTOR) 40 MG tablet TAKE 1 TABLET BY MOUTH EVERY DAY Patient taking differently: Take 40 mg by mouth daily.  09/13/18  Yes Troy Sine, MD  torsemide (DEMADEX) 20 MG tablet Take 20 mg by mouth 2 (two) times daily.    Yes [provider]  traMADol (ULTRAM) 50 MG tablet Take 50 mg by mouth every 6 (six) hours as needed (for pain).  10/14/12  Yes [provider]  albuterol (PROVENTIL HFA) 108 (90 Base) MCG/ACT inhaler Inhale 2 puffs into the lungs every 6 (six) hours as needed for wheezing or shortness of breath. 11/08/17   Geradine Girt, DO      Eliseo Gum MSN, AGACNP-BC [place] [phone] If no answer, [phone] 07/13/2019, 11:12 AM

## 2019-07-13 NOTE — Progress Notes (Signed)
[facility] critical care medicine  Case discussed with pharmacy.  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  Pharmacy has discussed the potential use of glucardipase with multiple other pharmacy centers including the cancer center at [facility] today.  It seems as if this drug is best indicated in the setting of high-dose IV methotrexate toxicity (typically within 48 hours of administration) rather than chronic, low dose methotrexate toxicity as we are dealing with today.  From literature review it seems that leucovorin and supportive care is the best approach.  Given the considerable cost of glucardiopase ($160,000 per dose of administration), the fact that we don't have a methotrexate level to guide [place], and that the last dose of methotrexate was 2 weeks ago we will forgo that treatment and continue supportive care for her aspiration pneumonia and MTX toxicity with folate, leucovorin.  Roselie Awkward, MD [facility] Pager: [phone] Cell: [phone] If no response, call [phone]

## 2019-07-13 NOTE — Progress Notes (Signed)
Pharmacy Antibiotic Note  Kristy Dougherty is a 69 y.o. female admitted on 07/19/2019 with anemia and open wounds on legs, now developing aspiration pneumonia. Rapid response called to bedside today, and pharmacy has been consulted for vancomycin and Zosyn dosing.  Patient has pancytopenia so WBC below normal limits, Tmax 102.9, SCr is stable trending near baseline at 2.43.   Plan: Vancomycin 2000mg  IV once then vancomycin 1000mg  IV q24h Zosyn 3.375 g IV q8h given over 4 hours Monitor renal function closely, clinical improvement, vanc levels if indicated F/U C&S, abx de-escalation, LOT  Height: 5\' 9"  (175.3 cm) Weight: 95.7 kg (211 lb) IBW/kg (Calculated) : 66.2  Temp (24hrs), Avg:99.9 F (37.7 C), Min:98.1 F (36.7 C), Max:102.9 F (39.4 C)  Recent Labs  Lab 07/21/2019 1641 07/11/19 0327 07/11/19 1501 07/12/19 0407 07/12/19 1323 07/13/19 0340  WBC 1.6*  1.5*  --  0.6* 0.2* 0.3* 0.3*  CREATININE 2.62* 2.53* 2.54* 2.50*  --  2.43*    Estimated Creatinine Clearance: 27.7 mL/min (A) (by C-G formula based on SCr of 2.43 mg/dL (H)).    No Known Allergies  Antimicrobials this admission: 6/12 CTX x1 Flagyl 6/12>6/13 Zosyn 6/13> Vanc 6/13>   Brendolyn Patty, PharmD PGY2 Pharmacy Resident Phone [phone]  07/13/2019   10:53 AM

## 2019-07-13 NOTE — Significant Event (Signed)
Rapid Response Event Note  Overview: Time Called: 0800 Arrival Time: 0805 Event Type: Other (Comment), Respiratory  Initial Focused Assessment: Patient is more lethargic this am and O2 sats on [place] were 79%  She is able to arouse and answer questions, but is very weak. Lung sounds decreased bases.  Heart tones regular  BP 111/69  HR 107  RR 20  O2 sat 83% on [place]  Rectal temp 101.8  Dr Maryland Pink at bedside to assess patient  Interventions: NRB O2 sats 97-99%  PXCR ABG Labs 40 mg Lasix given IV Bladder scan "0" pt voiding via purwick Oral care done Tylenol IV New Abx orders/initiated NPO bc concern for aspiration  BP 132/71  HR 108  RR 26  O2 sat 97% on NRB   Plan of Care (if not transferred): Plan transfer to progressive care CCM consult  Event Summary: Name of Physician Notified: Bonnielee Haff at 0800    at    Outcome: Transferred (Comment)     Raliegh Ip

## 2019-07-13 NOTE — Progress Notes (Addendum)
1212- 2W requested report at 1245 d/t RN accepting patient from ED.   1310 Attempted report x1. RN Not available.  1347 Attempted report x2. No answer on 2W.  1450 Report called to [facility], RN and patient transferred to 2W16.

## 2019-07-13 NOTE — Progress Notes (Signed)
Received call from telemetry patient has been SR throughout the morning but is now in Afib with a HR of 86. Dr. Maryland Pink made aware.

## 2019-07-13 NOTE — Progress Notes (Signed)
[facility] PROGRESS NOTE   Kristy Dougherty WCH:[id number] DOB: 1951/01/28 DOA: 07/03/2019  PCP: [name], MD  Brief History/Interval Summary: 69 y.o. female with medical history significant of Psoriatic arthritis on MTX, DM2, HTN, CKD stage 3-4 follows with Dr. [name].  Presented to the ED with several week complaint of progressively worsening and difficulty with ADLs.  No focal weakness.  Apparently also had a fall a few weeks ago.  She also developed open wounds on her legs a few weeks ago.  Patient was found to have severe pancytopenia.  PRBC transfusion was ordered.  She was hospitalized for further management.   Reason for Visit: Pancytopenia  Consultants: Hematology. PCCM  Procedures:  So far has been transfused 3 units of PRBC and 3 pack of platelets   Antibiotics: Anti-infectives (From admission, onward)   Start     Dose/Rate Route Frequency Ordered Stop   07/14/19 1200  vancomycin (VANCOCIN) IVPB 1000 mg/200 mL premix     Discontinue     1,000 mg 200 mL/hr over 60 Minutes Intravenous Every 24 hours 07/13/19 1047     07/13/19 0930  vancomycin (VANCOREADY) IVPB 2000 mg/400 mL     Discontinue     2,000 mg 200 mL/hr over 120 Minutes Intravenous  Once 07/13/19 0914     07/13/19 0900  doxycycline (VIBRAMYCIN) 100 mg in sodium chloride 0.9 % 250 mL IVPB  Status:  Discontinued        100 mg 125 mL/hr over 120 Minutes Intravenous Every 12 hours 07/13/19 0830 07/13/19 0913   07/13/19 0900  piperacillin-tazobactam (ZOSYN) IVPB 3.375 g     Discontinue     3.375 g 12.5 mL/hr over 240 Minutes Intravenous Every 8 hours 07/13/19 0853     07/12/19 1445  cefTRIAXone (ROCEPHIN) 2 g in sodium chloride 0.9 % 100 mL IVPB  Status:  Discontinued       "And" Linked Group Details   2 g 200 mL/hr over 30 Minutes Intravenous Every 24 hours 07/12/19 1437 07/13/19 0830   07/12/19 1445  metroNIDAZOLE (FLAGYL) IVPB 500 mg  Status:  Discontinued       "And" Linked Group Details   500  mg 100 mL/hr over 60 Minutes Intravenous Every 8 hours 07/12/19 1437 07/13/19 0830      Subjective/Interval History: Overnight events noted.  Patient requiring oxygen.  Her saturations were noted to be in the mid 80s this morning.  Rapid response was called.  Upon arrival at bedside patient noted to be lethargic but easily arousable.  She was on a nonrebreather saturating in the early 90s.  Lungs reveal crackles bilateral bases.  Patient denies any chest pain.     Assessment/Plan:  Acute respiratory failure with hypoxia due to aspiration pneumonia/sepsis, not present on admission  Chest x-ray shows right lung opacity.  Patient also received multiple transfusions which could have resulted in overload.  She was also given Lasix.  Blood pressure is good.  She was noted to be febrile.  Antibiotics changed over to vancomycin and Zosyn.  She will be transferred to progressive care unit.  Leave her on nonrebreather.  Pulmonology consulted.  ABG reviewed.  No evidence for hypercapnia.  Blood cultures.  Check procalcitonin level tomorrow morning.  N.p.o. for now.  Speech therapy consultation.  Methotrexate toxicity This is the most likely reason for her pancytopenia.  Her methotrexate was held hoping that it will get out of her system.  However today her oral mucosa reveals ulcers which  are discolored.  Has skin ulcers around her genitalia.  No obvious mucosal sloughing is noted.  SJS was considered.  However this could all be just some methotrexate toxicity.  Discussed with critical care medicine.  Glucarpidase is used for methotrexate toxicity which is recommended.  Pharmacy says that it is not easily available that they will try to procure it.  In the meantime patient will be given leucovorin.  Pancytopenia Reason for her severe pancytopenia is thought to be methotrexate toxicity.  Hematology/oncology is following.  Methotrexate has been held. Patient has so far been transfused with 3 units of PRBC and  3 packs of platelets.  Hemoglobin is stable at 8.0 this morning.  Platelet counts have improved to 25,000.  No evidence for bleeding.  Continue to check every day.   It may take many days for this patient's counts to recover.  Continue supportive care.  No indication for bone marrow at this time.  LFTs remained stable.  Bilirubin is stable.  LDH was 217.  HIV nonreactive.     Chronic kidney disease stage III-IV/hypokalemia/hypomagnesemia Renal function seems to be close to her baseline.  Continue to monitor urine output.  Potassium is normal today.  Avoid nephrotoxic agents.    History of psoriasis with psoriatic arthritis On methotrexate for same.  This is being held.  Bilateral leg wounds Methotrexate can cause skin necrosis.  So her wounds are most likely due to methotrexate as well.  Wound care nurse is following.   See above as well.  Diabetes mellitus type 2 with chronic kidney disease On 70/30 insulin at home. Monitor CBGs.  HbA1c is 6.0.  Long-acting insulin was held yesterday due to low glucose levels.  Essential hypertension Continue to monitor blood pressures closely.  Noted to be on hydralazine, amlodipine and carvedilol.  Blood pressure is reasonably well controlled.  Monitor closely for drop in blood pressures.  Fracture of the right seventh rib This likely occurred when she fell a few weeks ago.  Obesity Estimated body mass index is 31.16 kg/m as calculated from the following:   Height as of this encounter: 5\' 9"  (1.753 m).   Weight as of this encounter: 95.7 kg.   DVT Prophylaxis: Cannot use heparin products due to thrombocytopenia.  Cannot use SCDs due to significant leg wounds.  Mobilize as much as possible. Code Status: Full code Family Communication: Discussed with her daughter at bedside. Disposition Plan:  Status is: Inpatient  Remains inpatient appropriate because:IV treatments appropriate due to intensity of illness or inability to take PO and Acute  respiratory failure with hypoxia   Dispo:  Patient From: Home  Planned Disposition: To be determined  Expected discharge date: 07/18/19  Medically stable for discharge: No     Medications:  Scheduled:  carvedilol  3.125 mg Oral BID   feeding supplement (ENSURE ENLIVE)  237 mL Oral TID BM   FLUoxetine  20 mg Oral Daily   folic acid  1 mg Oral Daily   furosemide       gabapentin  300 mg Oral QHS   hydrALAZINE  100 mg Oral Daily   insulin aspart  0-15 Units Subcutaneous TID WC   insulin aspart  0-5 Units Subcutaneous QHS   leucovorin  10 mg/m2 Intravenous Q6H   magic mouthwash w/lidocaine  10 mL Oral TID   mometasone-formoterol  2 puff Inhalation BID   multivitamin with minerals  1 tablet Oral Daily   nystatin   Topical TID   pantoprazole  80 mg Oral Daily   pneumococcal 23 valent vaccine  0.5 mL Intramuscular Tomorrow-1000   Continuous:  acetaminophen 1,000 mg (07/13/19 1008)   piperacillin-tazobactam (ZOSYN)  IV 3.375 g (07/13/19 0907)   [START ON 07/14/2019] vancomycin     vancomycin     SNK:[id number], acetaminophen, albuterol, antiseptic oral rinse, cyclobenzaprine, fluticasone, lidocaine, ondansetron **OR** ondansetron (ZOFRAN) IV, phenol, traMADol   Objective:  Vital Signs  Vitals:   07/13/19 0813 07/13/19 0827 07/13/19 0849 07/13/19 0954  BP:  132/71  (!) 143/78  Pulse:  (!) 108  (!) 103  Resp:  (!) 26  (!) 24  Temp:  100 F (37.8 C) (!) 101.8 F (38.8 C) 100 F (37.8 C)  TempSrc:  Oral Rectal Axillary  SpO2: 94% 97%  100%  Weight:      Height:        Intake/Output Summary (Last 24 hours) at 07/13/2019 1128 Last data filed at 07/13/2019 1103 Gross per 24 hour  Intake 581 ml  Output 1000 ml  Net -419 ml   Filed Weights   07/27/2019 1607  Weight: 95.7 kg    General appearance: Lethargic but easily arousable.  In no distress. Oromucosa shows blackish lesions in the roof of the mouth with ulcers. Resp: Shallow respirations.   Crackles bilateral bases right more than left.  No wheezing.  Noted to be tachypneic.  No use of accessory muscles.   Cardio: S1-S2 is normal regular.  No S3-S4.  No rubs murmurs or bruit GI: Abdomen is soft.  Nontender nondistended.  Bowel sounds are present normal.  No masses organomegaly Ulcers noted in the lower abdominal folds as well as near the genitalia Extremities: Covered with dressing Neurologic: Lethargic but arousable..  No focal neurological deficits.    Lab Results:  Data Reviewed: I have personally reviewed following labs and imaging studies  CBC: Recent Labs  Lab 07/04/2019 1641 07/16/2019 1641 07/11/19 0647 07/11/19 1501 07/12/19 0407 07/12/19 1323 07/13/19 0340  WBC 1.6*   1.5*  --   --  0.6* 0.2* 0.3* 0.3*  NEUTROABS 1.0*  --   --  0.1* 0.0*  --  0.0*  HGB 5.7*   5.7*   < > 6.9* 6.8* 7.9* 7.9* 8.0*  HCT 17.8*   17.9*   < > 20.5* 20.3* 23.0* 23.2* 23.9*  MCV 106.0*   107.2*  --   --  97.1 95.8 97.1 98.0  PLT 20*   19*  --   --  8* 16* 13* 25*   < > = values in this interval not displayed.    Basic Metabolic Panel: Recent Labs  Lab 07/18/2019 1641 07/11/19 0327 07/11/19 0647 07/11/19 1501 07/12/19 0407 07/13/19 0340  NA 137 139  --  138 140 140  K 3.3* 2.8*  --  3.6 3.4* 3.7  CL 103 102  --  105 105 103  CO2 23 26  --  26 25 26   GLUCOSE 129* 115*  --  149* 116* 127*  BUN 42* 41*  --  44* 46* 48*  CREATININE 2.62* 2.53*  --  2.54* 2.50* 2.43*  CALCIUM 7.7* 7.6*  --  7.4* 7.4* 7.6*  MG  --   --  1.3*  --  1.5*  --     GFR: Estimated Creatinine Clearance: 27.7 mL/min (A) (by C-G formula based on SCr of 2.43 mg/dL (H)).  Liver Function Tests: Recent Labs  Lab 07/11/19 0327 07/12/19 0407 07/13/19 0340  AST 25 29 23  ALT 17 17 13   ALKPHOS 181* 226* 145*  BILITOT 2.2* 2.5* 1.9*  PROT 4.3* 4.8* 4.7*  ALBUMIN 1.6* 1.7* 1.4*     HbA1C: Recent Labs    07/11/19 0327  HGBA1C 6.0*    CBG: Recent Labs  Lab 07/12/19 0748 07/12/19 1119  07/12/19 1712 07/12/19 2104 07/13/19 0802  GLUCAP 104* 111* 160* 123* 113*     Recent Results (from the past 240 hour(s))  SARS Coronavirus 2 by RT PCR (hospital order, performed in [facility] hospital lab) Nasopharyngeal Nasopharyngeal Swab     Status: None   Collection Time: 07/24/2019  6:31 PM   Specimen: Nasopharyngeal Swab  Result Value Ref Range Status   SARS Coronavirus 2 NEGATIVE NEGATIVE Final    Comment: (NOTE) SARS-CoV-2 target nucleic acids are NOT DETECTED.  The SARS-CoV-2 RNA is generally detectable in upper and lower respiratory specimens during the acute phase of infection. The lowest concentration of SARS-CoV-2 viral copies this assay can detect is 250 copies / mL. A negative result does not preclude SARS-CoV-2 infection and should not be used as the sole basis for treatment or other patient management decisions.  A negative result may occur with improper specimen collection / handling, submission of specimen other than nasopharyngeal swab, presence of viral mutation(s) within the areas targeted by this assay, and inadequate number of viral copies (<250 copies / mL). A negative result must be combined with clinical observations, patient history, and epidemiological information.  Fact Sheet for Patients:   StrictlyIdeas.no  Fact Sheet for Healthcare Providers: BankingDealers.co.za  This test is not yet approved or  cleared by the [place] FDA and has been authorized for detection and/or diagnosis of SARS-CoV-2 by FDA under an Emergency Use Authorization (EUA).  This EUA will remain in effect (meaning this test can be used) for the duration of the COVID-19 declaration under Section 564(b)(1) of the Act, 21 U.S.C. section 360bbb-3(b)(1), unless the authorization is terminated or revoked sooner.  Performed at [facility], [place] [address] [place], [place] [address]       Radiology Studies: DG CHEST  PORT 1 VIEW  Result Date: 07/13/2019 CLINICAL DATA:  Oxygen desaturation EXAM: PORTABLE CHEST 1 VIEW COMPARISON:  July 10, 2019 FINDINGS: There is airspace opacity in the right lower lung region. More subtle airspace opacity noted in the right perihilar region. There is atelectatic change in the left base with equivocal small left pleural effusion. Lungs are otherwise clear. The heart is upper normal in size with pulmonary vascularity normal. No adenopathy. No pneumothorax. No bone lesions. IMPRESSION: New airspace opacity throughout the right lower lung region consistent with pneumonia. More subtle airspace opacity in the right perihilar region, also likely due to pneumonia. Equivocal small left pleural effusion with left base atelectasis. Lungs elsewhere clear. Stable cardiac silhouette. No adenopathy evident. Electronically Signed   By: Lowella Grip III M.D.   On: 07/13/2019 08:32       LOS: 3 days   [facility] Pager on [email or web address]  07/13/2019, 11:28 AM

## 2019-07-13 NOTE — Progress Notes (Signed)
RT obtained ABG during Rapid Response and sent to lab. Called and spoke with Shanon Brow and let him know it was on the way. Patient currently on NRB, SAT 92%. Rapid RN at bedside

## 2019-07-13 NOTE — Progress Notes (Signed)
   07/13/19 0806  Assess: MEWS Score  Temp 100 F (37.8 C)  BP 111/69  Pulse Rate (!) 107  Resp 20  Level of Consciousness Alert  SpO2 (!) 83 %  O2 Device Nasal Cannula  O2 Flow Rate (L/min) 3 L/min  Assess: MEWS Score  MEWS Temp 0  MEWS Systolic 0  MEWS Pulse 1  MEWS RR 0  MEWS LOC 0  MEWS Score 1  MEWS Score Color Green  Assess: if the MEWS score is Yellow or Red  Were vital signs taken at a resting state? Yes  Focused Assessment Documented focused assessment  Early Detection of Sepsis Score *See Row Information* High  MEWS guidelines implemented *See Row Information* No, previously red, continue vital signs every 4 hours  Treat  MEWS Interventions Escalated (See documentation below) (Called RRT and MD)  Notify: Charge Nurse/RN  Name of Charge Nurse/RN Notified Kristi D, RN  Date Charge Nurse/RN Notified 07/13/19  Time Charge Nurse/RN Notified 4818  Notify: Provider  Provider Name/Title Dr. Maryland Pink  Date Provider Notified 07/13/19  Time Provider Notified [phone]  Notification Type Call  Notification Reason Change in status (Pt requiring more O2)  Response See new orders  Date of Provider Response 07/13/19  Time of Provider Response 0809  Notify: Rapid Response  Name of Rapid Response RN Notified Helle, RN  Date Rapid Response Notified 07/13/19  Time Rapid Response Notified 0807  Document  Patient Outcome Stabilized after interventions;Transferred/level of care increased;Other (Comment) (WIll transfer to progressive bed when available.)  Progress note created (see row info) Yes   Upon first assessment, patient's O2 79-82% on 3L. RED MEWS was previously started on patient and was being finished up. This was the first change in patient's status (requiring more O2) RED MEWS was continued. Dr. Maryland Pink and Kelli Churn, RRT both notified and at the patients bedside. Patient will be transferred to progressive care bed when available.

## 2019-07-13 NOTE — Progress Notes (Signed)
Administered 1 unit of platelets as ordered at the beginning of this shift. Completed infusion just before 2300hrs. 20mg  iv lasix administered post transfusion. Diuresis has not improved urinary output. Pt also desaturating on room air, sats in the mid 80s. 2liters nasal canula oxygen only brought sats up to 88%. Went up to [employer] and saw an improvement, sats currently 95%. Pt notably more lethargic in comparison to 24hrs ago. Fever now 99.3 but still in the yellow MEWS score. Will continue to monitor and consult rapid response/MD if any decline noted

## 2019-07-14 DIAGNOSIS — T451X1S Poisoning by antineoplastic and immunosuppressive drugs, accidental (unintentional), sequela: Secondary | ICD-10-CM

## 2019-07-14 DIAGNOSIS — R7881 Bacteremia: Secondary | ICD-10-CM

## 2019-07-14 DIAGNOSIS — T451X1A Poisoning by antineoplastic and immunosuppressive drugs, accidental (unintentional), initial encounter: Secondary | ICD-10-CM

## 2019-07-14 LAB — COMPREHENSIVE METABOLIC PANEL
ALT: 11 U/L (ref 0–44)
AST: 21 U/L (ref 15–41)
Albumin: 1.1 g/dL — ABNORMAL LOW (ref 3.5–5.0)
Alkaline Phosphatase: 81 U/L (ref 38–126)
Anion gap: 13 (ref 5–15)
BUN: 57 mg/dL — ABNORMAL HIGH (ref 8–23)
CO2: 23 mmol/L (ref 22–32)
Calcium: 7.6 mg/dL — ABNORMAL LOW (ref 8.9–10.3)
Chloride: 108 mmol/L (ref 98–111)
Creatinine, Ser: 2.83 mg/dL — ABNORMAL HIGH (ref 0.44–1.00)
GFR calc Af Amer: 19 mL/min — ABNORMAL LOW (ref >60)
GFR calc non Af Amer: 17 mL/min — ABNORMAL LOW (ref >60)
Glucose, Bld: 83 mg/dL (ref 70–99)
Potassium: 3.8 mmol/L (ref 3.5–5.1)
Sodium: 144 mmol/L (ref 135–145)
Total Bilirubin: 2.3 mg/dL — ABNORMAL HIGH (ref 0.3–1.2)
Total Protein: 4.3 g/dL — ABNORMAL LOW (ref 6.5–8.1)

## 2019-07-14 LAB — CBC WITH DIFFERENTIAL/PLATELET
Abs Immature Granulocytes: 0 10*3/uL (ref 0.00–0.07)
Basophils Absolute: 0 10*3/uL (ref 0.0–0.1)
Basophils Relative: 0 %
Eosinophils Absolute: 0 10*3/uL (ref 0.0–0.5)
Eosinophils Relative: 5 %
HCT: 24.2 % — ABNORMAL LOW (ref 36.0–46.0)
Hemoglobin: 7.9 g/dL — ABNORMAL LOW (ref 12.0–15.0)
Immature Granulocytes: 0 %
Lymphocytes Relative: 61 %
Lymphs Abs: 0.1 10*3/uL — ABNORMAL LOW (ref 0.7–4.0)
MCH: 32.6 pg (ref 26.0–34.0)
MCHC: 32.6 g/dL (ref 30.0–36.0)
MCV: 100 fL (ref 80.0–100.0)
Monocytes Absolute: 0 10*3/uL — ABNORMAL LOW (ref 0.1–1.0)
Monocytes Relative: 5 %
Neutro Abs: 0.1 10*3/uL — ABNORMAL LOW (ref 1.7–7.7)
Neutrophils Relative %: 29 %
Platelets: 14 10*3/uL — CL (ref 150–400)
RBC: 2.42 MIL/uL — ABNORMAL LOW (ref 3.87–5.11)
RDW: 18.2 % — ABNORMAL HIGH (ref 11.5–15.5)
WBC: 0.2 10*3/uL — CL (ref 4.0–10.5)
nRBC: 0 % (ref 0.0–0.2)

## 2019-07-14 LAB — BLOOD GAS, ARTERIAL
Acid-Base Excess: 0.6 mmol/L (ref 0.0–2.0)
Bicarbonate: 25.6 mmol/L (ref 20.0–28.0)
Drawn by: 53309
FIO2: 100
O2 Saturation: 82.4 %
Patient temperature: 36.7
pCO2 arterial: 47.7 mmHg (ref 32.0–48.0)
pH, Arterial: 7.348 — ABNORMAL LOW (ref 7.350–7.450)
pO2, Arterial: 50.1 mmHg — ABNORMAL LOW (ref 83.0–108.0)

## 2019-07-14 LAB — BLOOD CULTURE ID PANEL (REFLEXED)

## 2019-07-14 LAB — PATHOLOGIST SMEAR REVIEW

## 2019-07-14 LAB — GLUCOSE, CAPILLARY
Glucose-Capillary: 85 mg/dL (ref 70–99)
Glucose-Capillary: 81 mg/dL (ref 70–99)
Glucose-Capillary: 82 mg/dL (ref 70–99)
Glucose-Capillary: 89 mg/dL (ref 70–99)

## 2019-07-14 LAB — MRSA PCR SCREENING: MRSA by PCR: NEGATIVE

## 2019-07-14 LAB — PROCALCITONIN: Procalcitonin: 42.82 ng/mL

## 2019-07-14 MED ORDER — SODIUM CHLORIDE 0.9 % IV BOLUS
500.0000 mL | Freq: Once | INTRAVENOUS | Status: AC
  Administered 2019-07-14: 500 mL via INTRAVENOUS

## 2019-07-14 MED ORDER — TBO-FILGRASTIM 480 MCG/0.8ML ~~LOC~~ SOSY
480.0000 ug | PREFILLED_SYRINGE | Freq: Every day | SUBCUTANEOUS | Status: DC
  Administered 2019-07-14: 480 ug via SUBCUTANEOUS
  Filled 2019-07-14: qty 0.8

## 2019-07-14 MED ORDER — MORPHINE SULFATE (PF) 2 MG/ML IV SOLN
1.0000 mg | INTRAVENOUS | Status: DC | PRN
  Administered 2019-07-14 (×2): 2 mg via INTRAVENOUS
  Administered 2019-07-15: 1 mg via INTRAVENOUS
  Filled 2019-07-14 (×3): qty 1

## 2019-07-14 MED ORDER — ALBUMIN HUMAN 25 % IV SOLN
25.0000 g | Freq: Three times a day (TID) | INTRAVENOUS | Status: DC
  Administered 2019-07-14: 25 g via INTRAVENOUS
  Filled 2019-07-14: qty 100

## 2019-07-14 MED ORDER — FUROSEMIDE 10 MG/ML IJ SOLN
40.0000 mg | Freq: Three times a day (TID) | INTRAMUSCULAR | Status: DC
  Filled 2019-07-14: qty 4

## 2019-07-14 MED ORDER — ACETAMINOPHEN 10 MG/ML IV SOLN
1000.0000 mg | Freq: Four times a day (QID) | INTRAVENOUS | Status: DC | PRN
  Administered 2019-07-14: 1000 mg via INTRAVENOUS
  Filled 2019-07-14: qty 100

## 2019-07-14 MED ORDER — FUROSEMIDE 10 MG/ML IJ SOLN
60.0000 mg | Freq: Once | INTRAMUSCULAR | Status: AC
  Administered 2019-07-14: 60 mg via INTRAVENOUS
  Filled 2019-07-14: qty 6

## 2019-07-14 MED ORDER — SODIUM CHLORIDE 0.9 % IV SOLN
2.0000 g | INTRAVENOUS | Status: DC
  Administered 2019-07-14: 2 g via INTRAVENOUS
  Filled 2019-07-14 (×2): qty 2

## 2019-07-14 MED ORDER — SILVER SULFADIAZINE 1 % EX CREA
TOPICAL_CREAM | Freq: Two times a day (BID) | CUTANEOUS | Status: DC
  Filled 2019-07-14: qty 85

## 2019-07-14 MED ORDER — FOLIC ACID 5 MG/ML IJ SOLN
1.0000 mg | Freq: Every day | INTRAMUSCULAR | Status: DC
  Administered 2019-07-14: 1 mg via INTRAVENOUS
  Filled 2019-07-14 (×2): qty 0.2

## 2019-07-14 MED ORDER — METOPROLOL TARTRATE 5 MG/5ML IV SOLN
2.5000 mg | Freq: Three times a day (TID) | INTRAVENOUS | Status: DC
  Administered 2019-07-14: 2.5 mg via INTRAVENOUS
  Filled 2019-07-14: qty 5

## 2019-07-14 MED ORDER — SODIUM CHLORIDE 0.9% IV SOLUTION: Freq: Once | INTRAVENOUS | Status: AC

## 2019-07-14 MED ORDER — FUROSEMIDE 10 MG/ML IJ SOLN
20.0000 mg | Freq: Once | INTRAMUSCULAR | Status: AC
  Administered 2019-07-14: 20 mg via INTRAVENOUS
  Filled 2019-07-14: qty 2

## 2019-07-14 NOTE — Progress Notes (Signed)
Pharmacy Antibiotic Note  Kristy Dougherty is a 69 y.o. female admitted on 07/23/2019 with bacteremia.  Pharmacy has been consulted for Cefepime dosing. Immunosuppressed patient with Pseudomonas in blood culture. Creatinine clearance 24 ml/min.  Per Dr. Maryland Pink, continue Vanc for now.  Plan: D/c Zosyn Start Cefepime 2 gms IV q24hr Monitor renal function and C&S.  Height: 5\' 9"  (175.3 cm) Weight: 95.7 kg (211 lb) IBW/kg (Calculated) : 66.2  Temp (24hrs), Avg:98.6 F (37 C), Min:97.6 F (36.4 C), Max:99.5 F (37.5 C)  Recent Labs  Lab 07/03/2019 1641 07/11/19 0327 07/11/19 1501 07/12/19 0407 07/12/19 1323 07/13/19 0340 07/14/19 0435  WBC   < >  --  0.6* 0.2* 0.3* 0.3* 0.2*  CREATININE  --  2.53* 2.54* 2.50*  --  2.43* 2.83*   < > = values in this interval not displayed.    Estimated Creatinine Clearance: 23.8 mL/min (A) (by C-G formula based on SCr of 2.83 mg/dL (H)).    No Known Allergies  Antimicrobials this admission: Zosyn 6/12 >> 6/14 Vanc 6/12 >> Cefepime 6/14 >>   Microbiology results: 6/13 BCx - 1/2 Pseud   Thank you for allowing pharmacy to be a part of this patients care.  Alanda Slim, PharmD, [facility] Clinical Pharmacist Please see AMION for all Pharmacists' Contact Phone Numbers 07/14/2019, 10:39 AM

## 2019-07-14 NOTE — Consult Note (Signed)
WOC Nurse Consult Note: Patient receiving care in [facility] 2W16. Reason for Consult: lesions across much of body: vagina, pannus, arms, legs, groin Wound type: psoriatic lesions in the presence of methotrexate toxicity Pressure Injury POA: Yes/No/NA Measurement: Wound bed: Drainage (amount, consistency, odor)  Periwound: Dressing procedure/placement/frequency: Patient was seen by my colleague on 07/11/19.  From Dr. Theresa Mulligan note on 07/13/19 @1422 :  "Clinical history is supportive of chronic methotrexate toxicity with skin ulcerations, pancytopenia and mucositis.Marland KitchenMarland KitchenFrom literature review it seems that leucovorin and supportive care is the best approach." Topical therapy for the lesions has been changed to twice daily application of Silvadene and telfa pads. Monitor the wound area(s) for worsening of condition such as: Signs/symptoms of infection,  Increase in size,  Development of or worsening of odor, Development of pain, or increased pain at the affected locations.  Notify the medical team if any of these develop.  Thank you for the consult.  [facility] nurse will not follow at this time.  Please re-consult the [facility] team if needed.  Val Riles, RN, MSN, CWOCN, CNS-BC, pager [phone]

## 2019-07-14 NOTE — Progress Notes (Addendum)
[facility] PROGRESS NOTE   Kristy Dougherty CHE:[id number] DOB: December 17, 1950 DOA: 07/24/2019  PCP: [name], MD  Brief History/Interval Summary: 69 y.o. female with medical history significant of Psoriatic arthritis on MTX, DM2, HTN, CKD stage 3-4 follows with Dr. [name].  Presented to the ED with several week complaint of progressively worsening and difficulty with ADLs.  No focal weakness.  Apparently also had a fall a few weeks ago.  She also developed open wounds on her legs a few weeks ago.  Patient was found to have severe pancytopenia.  PRBC transfusion was ordered.  She was hospitalized for further management.   Reason for Visit: Pancytopenia  Consultants: Hematology. PCCM  Procedures:  So far has been transfused 3 units of PRBC and 3 pack of platelets   Antibiotics: Anti-infectives (From admission, onward)   Start     Dose/Rate Route Frequency Ordered Stop   07/14/19 1200  vancomycin (VANCOCIN) IVPB 1000 mg/200 mL premix     Discontinue     1,000 mg 200 mL/hr over 60 Minutes Intravenous Every 24 hours 07/13/19 1047     07/13/19 0930  vancomycin (VANCOREADY) IVPB 2000 mg/400 mL        2,000 mg 200 mL/hr over 120 Minutes Intravenous  Once 07/13/19 0914 07/13/19 1332   07/13/19 0900  doxycycline (VIBRAMYCIN) 100 mg in sodium chloride 0.9 % 250 mL IVPB  Status:  Discontinued        100 mg 125 mL/hr over 120 Minutes Intravenous Every 12 hours 07/13/19 0830 07/13/19 0913   07/13/19 0900  piperacillin-tazobactam (ZOSYN) IVPB 3.375 g     Discontinue     3.375 g 12.5 mL/hr over 240 Minutes Intravenous Every 8 hours 07/13/19 0853     07/12/19 1445  cefTRIAXone (ROCEPHIN) 2 g in sodium chloride 0.9 % 100 mL IVPB  Status:  Discontinued       "And" Linked Group Details   2 g 200 mL/hr over 30 Minutes Intravenous Every 24 hours 07/12/19 1437 07/13/19 0830   07/12/19 1445  metroNIDAZOLE (FLAGYL) IVPB 500 mg  Status:  Discontinued       "And" Linked Group Details   500  mg 100 mL/hr over 60 Minutes Intravenous Every 8 hours 07/12/19 1437 07/13/19 0830      Subjective/Interval History: No acute events overnight.  Patient not as lethargic as she was yesterday.  Continues to have some difficulty breathing.  No cough.  Continues to have pain in her mouth.  Patient denies any chest pain.   Assessment/Plan:  Acute respiratory failure with hypoxia due to aspiration pneumonia/sepsis, not present on admission  Chest x-ray showed right lung opacity.  Patient also received multiple transfusions which could have resulted in overload.  She was also given Lasix.  Patient was also noted to be febrile.  Antibiotics were changed over to vancomycin and Zosyn.  She remains on a nonrebreather mask currently saturating in the mid 90s.  Seen by PCCM yesterday.  She is a DO NOT INTUBATE.  Would hesitate to use BiPAP due to her aspiration risk however if she continues to have problems with oxygenation then we may be forced to use it for short-term.  ABG did not show any hypercapnia.  Procalcitonin level significantly high at 42.82.  Continue antibacterials.  N.p.o. for now.  Seen by speech therapy.  Change oral medications to alternative route.  Pseudomonas bacteremia Most likely from her wounds.  She was quite febrile yesterday.  Fever appears to have subsided.  Will get an opinion from infectious disease as well.  Continue with Zosyn.  This is all in the setting of significant neutropenia.  Methotrexate toxicity This is the most likely reason for her pancytopenia.  Her methotrexate was held hoping that it will get out of her system.  However patient has now developed a mucosal lesions in her mouth.  SJS was considered however all of this appears to be just methotrexate toxicity.  Leucovorin was initiated yesterday.  We did investigate the use of glucarpidase in the situation.  However after significant research as well as discussions held by pharmacy with counterparts in [facility] it was found that glucarpidase is mainly used in the setting of an acute intravenous methotrexate toxicity.  This is more of a chronic toxicity.  Continue supportive care as well.  Oral care.  Pancytopenia Reason for her severe pancytopenia is thought to be methotrexate toxicity.  Hematology/oncology is following.  Methotrexate has been held. This morning her hemoglobin is stable at 7.9.  She remains leukopenic.  Platelet counts have dropped down to 14,000.  Additional unit of platelet will be ordered today.  Hematology has determined the threshold for platelet count as greater than 15,000. You may take many days for this patient's counts to recover.  Continue supportive care.  Hematology/oncology is following.  Bilirubin is stable.  All LFTs are normal.  LDH was 217.  HIV nonreactive.    As of today patient has received 3 units of PRBC and 4 units of platelets.    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  Creatinine had been stable for the last several days.  Noted to have gone up today.  Most likely due to Lasix given yesterday as well as poor oral intake the last 24-48 hrs. Give IV fluid bolus. Noted to be oliguric. If no renal response in 24 hours then may need to involve Nephrology. Patient has seen Dr. Joelyn Oms previously. Patient seems to be a poor candidate for HD however.  Monitor closely.  Monitor urine output.  Bladder scans.  History of psoriasis with psoriatic arthritis Was on methotrexate for same.  This is being held.  Bilateral leg wounds with infection Methotrexate can cause skin necrosis.  So her wounds are most likely due to methotrexate as well.  Wound care nurse is following.   See above as well.  Diabetes mellitus type 2 with chronic kidney disease On 70/30 insulin at home. Monitor CBGs.  HbA1c is 6.0.  Long-acting insulin was held due to low glucose levels.  Essential  hypertension Continue to monitor blood pressures closely.  Noted to be on hydralazine, amlodipine and carvedilol.  Blood pressure now running low normal.  We will cut back on some of these medications.    Fracture of the right seventh rib This likely occurred when she fell a few weeks ago.  Nutrition Thought to be high aspiration risk.  Made NPO.  If she does not improve in the next 24 to 48 hours may need to place nasogastric feeding tube.  Obesity Estimated body mass index is 31.16 kg/m as calculated from the following:   Height as of this encounter: 5\' 9"  (1.753 m).   Weight as of this encounter: 95.7 kg.  [place] Long discussion with patient's daughter and sister.  Patient's multiple medical conditions explained to them.  Prognosis is guarded at this time.  Patient's daughter states that it is very hard for her to see her mother  suffer this way.  At the same time she is hoping for some kind of recovery.  Patient's sister mentioned that patient had previously expressed wish for no hemodialysis.  Daughter to think about that today.  Will reapproach these issues tomorrow depending on her clinical status.  ADDENDUM Called by RN that patient now saturating in the 80's. Came to bedside. Daughter and sister in room. Patient lethargic. No intubation per her wishes. We discussed using bipap but it will challenging to use due to her mucosal lesions. Daughter does not want patient to suffer. We will use Heated High Flow O2. If she deteriorates then will NOT use Bipap and allow her to pass peacefully. Prognosis is poor. Change to DNR.  DVT Prophylaxis: Cannot use heparin products due to thrombocytopenia.  Cannot use SCDs due to significant leg wounds.  Mobilize as much as possible. Code Status: See above. Family Communication: Discussions being held with daughter daily. Disposition Plan:  Status is: Inpatient  Remains inpatient appropriate because:IV treatments appropriate due to intensity of illness  or inability to take PO and Acute respiratory failure with hypoxia   Dispo:  Patient From: Home  Planned Disposition: To be determined  Expected discharge date: 07/18/19  Medically stable for discharge: No     Medications:  Scheduled: . carvedilol  3.125 mg Oral BID  . feeding supplement (ENSURE ENLIVE)  237 mL Oral TID BM  . [START ON 07/24/2019] FLUoxetine  20 mg Oral Daily  . folic acid  1 mg Oral Daily  . gabapentin  300 mg Oral QHS  . hydrALAZINE  100 mg Oral Daily  . insulin aspart  0-15 Units Subcutaneous TID WC  . insulin aspart  0-5 Units Subcutaneous QHS  . leucovorin  10 mg/m2 Intravenous Q6H  . magic mouthwash w/lidocaine  10 mL Oral TID  . mometasone-formoterol  2 puff Inhalation BID  . multivitamin with minerals  1 tablet Oral Daily  . nystatin   Topical TID  . pantoprazole  80 mg Oral Daily  . pneumococcal 23 valent vaccine  0.5 mL Intramuscular Tomorrow-1000  . silver sulfADIAZINE   Topical BID   Continuous: . piperacillin-tazobactam (ZOSYN)  IV 3.375 g (07/14/19 0525)  . vancomycin     URK:[id number], albuterol, antiseptic oral rinse, cyclobenzaprine, fentaNYL (SUBLIMAZE) injection, fluticasone, lidocaine, ondansetron **OR** ondansetron (ZOFRAN) IV, phenol, traMADol   Objective:  Vital Signs  Vitals:   07/13/19 2332 07/14/19 0200 07/14/19 0321 07/14/19 0751  BP: (!) 111/50  110/68 (!) 115/51  Pulse: 78  77   Resp: (!) 24  (!) 23 (!) 23  Temp: 97.6 F (36.4 C) 98.4 F (36.9 C) 98 F (36.7 C) 98.2 F (36.8 C)  TempSrc: Oral Axillary Axillary Axillary  SpO2: 98%  93%   Weight:      Height:        Intake/Output Summary (Last 24 hours) at 07/14/2019 0916 Last data filed at 07/14/2019 0525 Gross per 24 hour  Intake 1469.43 ml  Output 550 ml  Net 919.43 ml   Filed Weights   07/23/2019 1607  Weight: 95.7 kg    General appearance: Lethargic but easily arousable.  In no distress.  Seems to be a little bit more awake today compared to  yesterday.   Oral cavity with ulcerations noted in the roof of the mouth.  Black discoloration. Resp: Tachypneic.  Crackles bilateral bases right more than left.  No wheezing or rhonchi. Cardio: S1-S2 is normal regular.  No S3-S4.  No rubs murmurs  or bruit GI: Abdomen is soft.  Nontender nondistended.  Bowel sounds are present normal.  No masses organomegaly Extremities: Covered with dressing Neurologic: Lethargic but easily arousable.  No focal deficits.    Lab Results:  Data Reviewed: I have personally reviewed following labs and imaging studies  CBC: Recent Labs  Lab 07/05/2019 1641 07/11/19 0647 07/11/19 1501 07/12/19 0407 07/12/19 1323 07/13/19 0340 07/14/19 0435  WBC 1.6*  1.5*  --  0.6* 0.2* 0.3* 0.3* 0.2*  NEUTROABS 1.0*  --  0.1* 0.0*  --  0.0* 0.1*  HGB 5.7*  5.7*   < > 6.8* 7.9* 7.9* 8.0* 7.9*  HCT 17.8*  17.9*   < > 20.3* 23.0* 23.2* 23.9* 24.2*  MCV 106.0*  107.2*  --  97.1 95.8 97.1 98.0 100.0  PLT 20*  19*  --  8* 16* 13* 25* 14*   < > = values in this interval not displayed.    Basic Metabolic Panel: Recent Labs  Lab 07/11/19 0327 07/11/19 0647 07/11/19 1501 07/12/19 0407 07/13/19 0340 07/14/19 0435  NA 139  --  138 140 140 144  K 2.8*  --  3.6 3.4* 3.7 3.8  CL 102  --  105 105 103 108  CO2 26  --  26 25 26 23   GLUCOSE 115*  --  149* 116* 127* 83  BUN 41*  --  44* 46* 48* 57*  CREATININE 2.53*  --  2.54* 2.50* 2.43* 2.83*  CALCIUM 7.6*  --  7.4* 7.4* 7.6* 7.6*  MG  --  1.3*  --  1.5*  --   --     GFR: Estimated Creatinine Clearance: 23.8 mL/min (A) (by C-G formula based on SCr of 2.83 mg/dL (H)).  Liver Function Tests: Recent Labs  Lab 07/11/19 0327 07/12/19 0407 07/13/19 0340 07/14/19 0435  AST 25 29 23 21   ALT 17 17 13 11   ALKPHOS 181* 226* 145* 81  BILITOT 2.2* 2.5* 1.9* 2.3*  PROT 4.3* 4.8* 4.7* 4.3*  ALBUMIN 1.6* 1.7* 1.4* 1.1*     HbA1C: No results for input(s): HGBA1C in the last 72 hours.  CBG: Recent Labs  Lab  07/13/19 0802 07/13/19 1200 07/13/19 2107 07/14/19 0421 07/14/19 0748  GLUCAP 113* 101* 86 85 81     Recent Results (from the past 240 hour(s))  SARS Coronavirus 2 by RT PCR (hospital order, performed in [facility] hospital lab) Nasopharyngeal Nasopharyngeal Swab     Status: None   Collection Time: 07/06/2019  6:31 PM   Specimen: Nasopharyngeal Swab  Result Value Ref Range Status   SARS Coronavirus 2 NEGATIVE NEGATIVE Final    Comment: (NOTE) SARS-CoV-2 target nucleic acids are NOT DETECTED.  The SARS-CoV-2 RNA is generally detectable in upper and lower respiratory specimens during the acute phase of infection. The lowest concentration of SARS-CoV-2 viral copies this assay can detect is 250 copies / mL. A negative result does not preclude SARS-CoV-2 infection and should not be used as the sole basis for treatment or other patient management decisions.  A negative result may occur with improper specimen collection / handling, submission of specimen other than nasopharyngeal swab, presence of viral mutation(s) within the areas targeted by this assay, and inadequate number of viral copies (<250 copies / mL). A negative result must be combined with clinical observations, patient history, and epidemiological information.  Fact Sheet for Patients:   StrictlyIdeas.no  Fact Sheet for Healthcare Providers: BankingDealers.co.za  This test is not yet approved or  cleared by the [place] and has been authorized for detection and/or diagnosis of SARS-CoV-2 by FDA under an Emergency Use Authorization (EUA).  This EUA will remain in effect (meaning this test can be used) for the duration of the COVID-19 declaration under Section 564(b)(1) of the Act, 21 U.S.C. section 360bbb-3(b)(1), unless the authorization is terminated or revoked sooner.  Performed at [facility], [place] [address] [place], [place] [address]   Culture,  blood (Routine X 2) w Reflex to ID Panel     Status: None (Preliminary result)   Collection Time: 07/13/19 10:03 AM   Specimen: BLOOD LEFT FOREARM  Result Value Ref Range Status   Specimen Description BLOOD LEFT FOREARM  Final   Special Requests   Final    BOTTLES DRAWN AEROBIC ONLY Blood Culture results may not be optimal due to an inadequate volume of blood received in culture bottles   Culture  Setup Time   Final    GRAM NEGATIVE RODS AEROBIC BOTTLE ONLY Organism ID to follow CRITICAL RESULT CALLED TO, READ BACK BY AND VERIFIED WITH: PHRMD V BRYK @0554  07/14/19 BY S GEZAHEGN Performed at [facility], [place] [address], [place], [place] [address]    Culture GRAM NEGATIVE RODS  Final   Report Status PENDING  Incomplete  Blood Culture ID Panel (Reflexed)     Status: Abnormal   Collection Time: 07/13/19 10:03 AM  Result Value Ref Range Status   Enterococcus species NOT DETECTED NOT DETECTED Final   Listeria monocytogenes NOT DETECTED NOT DETECTED Final   Staphylococcus species NOT DETECTED NOT DETECTED Final   Staphylococcus aureus (BCID) NOT DETECTED NOT DETECTED Final   Streptococcus species NOT DETECTED NOT DETECTED Final   Streptococcus agalactiae NOT DETECTED NOT DETECTED Final   Streptococcus pneumoniae NOT DETECTED NOT DETECTED Final   Streptococcus pyogenes NOT DETECTED NOT DETECTED Final   Acinetobacter baumannii NOT DETECTED NOT DETECTED Final   Enterobacteriaceae species NOT DETECTED NOT DETECTED Final   Enterobacter cloacae complex NOT DETECTED NOT DETECTED Final   Escherichia coli NOT DETECTED NOT DETECTED Final   Klebsiella oxytoca NOT DETECTED NOT DETECTED Final   Klebsiella pneumoniae NOT DETECTED NOT DETECTED Final   Proteus species NOT DETECTED NOT DETECTED Final   Serratia marcescens NOT DETECTED NOT DETECTED Final   Carbapenem resistance NOT DETECTED NOT DETECTED Final   Haemophilus influenzae NOT DETECTED NOT DETECTED Final   Neisseria meningitidis NOT  DETECTED NOT DETECTED Final   Pseudomonas aeruginosa DETECTED (A) NOT DETECTED Final    Comment: CRITICAL RESULT CALLED TO, READ BACK BY AND VERIFIED WITH: PHRMD V BRYK @0554  07/14/19 BY S GEZAHEGN    Candida albicans NOT DETECTED NOT DETECTED Final   Candida glabrata NOT DETECTED NOT DETECTED Final   Candida krusei NOT DETECTED NOT DETECTED Final   Candida parapsilosis NOT DETECTED NOT DETECTED Final   Candida tropicalis NOT DETECTED NOT DETECTED Final    Comment: Performed at [facility], [address] [place], [place] [address]  Culture, blood (Routine X 2) w Reflex to ID Panel     Status: None (Preliminary result)   Collection Time: 07/13/19 10:04 AM   Specimen: BLOOD LEFT FOREARM  Result Value Ref Range Status   Specimen Description BLOOD LEFT FOREARM  Final   Special Requests   Final    BOTTLES DRAWN AEROBIC ONLY Blood Culture results may not be optimal due to an inadequate volume of blood received in culture bottles  Culture  Setup Time   Final    GRAM NEGATIVE RODS AEROBIC BOTTLE ONLY CRITICAL VALUE NOTED.  VALUE IS CONSISTENT WITH PREVIOUSLY REPORTED AND CALLED VALUE. Performed at [facility], [place] [address], [place],  [address]    Culture GRAM NEGATIVE RODS  Final   Report Status PENDING  Incomplete      Radiology Studies: DG CHEST PORT 1 VIEW  Result Date: 07/13/2019 CLINICAL DATA:  Oxygen desaturation EXAM: PORTABLE CHEST 1 VIEW COMPARISON:  July 10, 2019 FINDINGS: There is airspace opacity in the right lower lung region. More subtle airspace opacity noted in the right perihilar region. There is atelectatic change in the left base with equivocal small left pleural effusion. Lungs are otherwise clear. The heart is upper normal in size with pulmonary vascularity normal. No adenopathy. No pneumothorax. No bone lesions. IMPRESSION: New airspace opacity throughout the right lower lung region consistent with pneumonia. More subtle airspace opacity in the  right perihilar region, also likely due to pneumonia. Equivocal small left pleural effusion with left base atelectasis. Lungs elsewhere clear. Stable cardiac silhouette. No adenopathy evident. Electronically Signed   By: Lowella Grip III M.D.   On: 07/13/2019 08:32       LOS: 4 days   [facility] Pager on [email or web address]  07/14/2019, 9:16 AM

## 2019-07-14 NOTE — Progress Notes (Addendum)
HEMATOLOGY-ONCOLOGY PROGRESS NOTE  SUBJECTIVE: Asked to see by hospitalist due to persistent pancytopenia now with acute hypoxic respiratory failure, pneumonia, and Pseudomonas bacteremia.  On a nonrebreather with O2 sats in the upper 80s.  Still very fatigued.  Mouth ulcers are still painful.  Not actively bleeding.  Leg ulcers look better today.  Not actively bleeding.  PHYSICAL EXAMINATION:  Vitals:   07/14/19 1520 07/14/19 1604  BP: (!) 101/44 (!) 115/44  Pulse: 78 78  Resp: (!) 22 (!) 21  Temp: 98.1 F (36.7 C)   SpO2: (!) 82% 91%   Filed Weights   07/02/2019 1607  Weight: 95.7 kg    Intake/Output from previous day: 06/13 0701 - 06/14 0700 In: 1469.4 [I.V.:1219.4; IV Piggyback:250] Out: 550 [Urine:550]  GENERAL: Alert, chronically ill, tachypneic SKIN: Patient has multiple ulcers to both legs with drainage or bleeding OROPHARYNX: Mouth ulcers noted lesions with dried blood in her mouth LUNGS: Tachypneic, rales to the bases  HEART: regular  ABDOMEN:abdomen soft, non-tender and normal bowel sounds NEURO: Alert, answers questions appropriately  LABORATORY DATA:  I have reviewed the data as listed CMP Latest Ref Rng & Units 07/14/2019 07/13/2019 07/12/2019  Glucose 70 - 99 mg/dL 83 127(H) 116(H)  BUN 8 - 23 mg/dL 57(H) 48(H) 46(H)  Creatinine 0.44 - 1.00 mg/dL 2.83(H) 2.43(H) 2.50(H)  Sodium 135 - 145 mmol/L 144 140 140  Potassium 3.5 - 5.1 mmol/L 3.8 3.7 3.4(L)  Chloride 98 - 111 mmol/L 108 103 105  CO2 22 - 32 mmol/L 23 26 25   Calcium 8.9 - 10.3 mg/dL 7.6(L) 7.6(L) 7.4(L)  Total Protein 6.5 - 8.1 g/dL 4.3(L) 4.7(L) 4.8(L)  Total Bilirubin 0.3 - 1.2 mg/dL 2.3(H) 1.9(H) 2.5(H)  Alkaline Phos 38 - 126 U/L 81 145(H) 226(H)  AST 15 - 41 U/L 21 23 29   ALT 0 - 44 U/L 11 13 17     Lab Results  Component Value Date   WBC 0.2 (LL) 07/14/2019   HGB 7.9 (L) 07/14/2019   HCT 24.2 (L) 07/14/2019   MCV 100.0 07/14/2019   PLT 14 (LL) 07/14/2019   NEUTROABS 0.1 (L) 07/14/2019     DG CHEST PORT 1 VIEW  Result Date: 07/13/2019 CLINICAL DATA:  Oxygen desaturation EXAM: PORTABLE CHEST 1 VIEW COMPARISON:  July 10, 2019 FINDINGS: There is airspace opacity in the right lower lung region. More subtle airspace opacity noted in the right perihilar region. There is atelectatic change in the left base with equivocal small left pleural effusion. Lungs are otherwise clear. The heart is upper normal in size with pulmonary vascularity normal. No adenopathy. No pneumothorax. No bone lesions. IMPRESSION: New airspace opacity throughout the right lower lung region consistent with pneumonia. More subtle airspace opacity in the right perihilar region, also likely due to pneumonia. Equivocal small left pleural effusion with left base atelectasis. Lungs elsewhere clear. Stable cardiac silhouette. No adenopathy evident. Electronically Signed   By: Lowella Grip III M.D.   On: 07/13/2019 08:32   DG Chest Port 1 View  Result Date: 07/19/2019 CLINICAL DATA:  Generalized weakness for 3 weeks EXAM: PORTABLE CHEST 1 VIEW COMPARISON:  11/07/2017 FINDINGS: Single frontal view of the chest demonstrates a stable cardiac silhouette. No airspace disease, effusion, or pneumothorax. Minimally displaced right posterior seventh rib fracture is noted. IMPRESSION: 1. Minimally displaced right posterior seventh rib fracture. 2. Otherwise no acute intrathoracic process. Electronically Signed   By: Randa Ngo M.D.   On: 07/11/2019 19:17    ASSESSMENT AND PLAN: 1.  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  Psoriatic arthritis 3.  Chronic kidney disease 4.  Hypertension 5.  Diabetes mellitus 6.  Acute hypoxic respiratory failure 7.  Pneumonia due to aspiration 8.  Pseudomonas bacteremia  Kristy Dougherty continues to worsen.  She has persistent pancytopenia, mouth ulcers, and leg ulcers secondary to methotrexate toxicity.  Currently receiving leucovorin.  Requiring multiple PRBC and platelet  transfusions.  White blood cell count is persistently low.  She is on IV antibiotics for pneumonia and bacteremia.  Recommendations: 1.  Begin Granix 480 mcg subcu daily until ANC is above 1.5. 2.  Transfuse PRBCs for hemoglobin less than 7. 3.  Transfuse platelets for platelet count of less than 10,000 or active bleeding. 4.  Discussed with family members at the bedside that improvement in her CBC will take time. Will continue supportive therapy.  5.  Continue leucovorin, follow-up methotrexate level   LOS: 4 days   Mikey Bussing, DNP, AGPCNP-BC, AOCNP 07/14/19 Kristy Dougherty was interviewed and examined.  Her family was at the bedside.  She was diagnosed with methotrexate toxicity last week when she presented with severe pancytopenia.  She has persistent severe pancytopenia despite Red cell and platelet transfusion support.  She was evaluated by Dr. Lindi Adie last week.  I agree the most likely explanation for the pancytopenia is methotrexate toxicity.  She now has Pseudomonas bacteremia and pneumonia.  We will begin G-CSF support.  It may take several weeks for the platelets and white cells to recover.  It is unclear when she was last given methotrexate.  Hematology will continue following her.

## 2019-07-14 NOTE — Progress Notes (Signed)
CRITICAL VALUE ALERT  Critical Value:  Methotrexate Level  0.4   Date & Time Notied:  07/14/19 22:15  Provider Notified: Opyd  Orders Received/Actions taken:

## 2019-07-14 NOTE — Evaluation (Signed)
Clinical/Bedside Swallow Evaluation Patient Details  Name: ONYINYECHI HUANTE MRN: [id number] Date of Birth: 1950-11-16  Today's Date: 07/14/2019 Time: SLP Start Time (ACUTE ONLY): 0840 SLP Stop Time (ACUTE ONLY): 0852 SLP Time Calculation (min) (ACUTE ONLY): 12 min  Past Medical History:  Past Medical History:  Diagnosis Date  . 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 [name]  . Diabetes mellitus without complication ([place])   . Hyperlipemia   . Hypertension   . Normal coronary arteries 2011  . Obesity   . OSA on CPAP   . Psoriatic arthritis ([place])   . Shingles July 2014   Past Surgical History:  Past Surgical History:  Procedure Laterality Date  . ABDOMINAL HYSTERECTOMY  1983  . CARDIAC CATHETERIZATION  04/02/2009   Normal coronary arteries  . US ECHOCARDIOGRAPHY  03/22/2009   EF =>55%,trace MR,mild TR & PI   HPI:  69 year old female with a past medical history significant for psoriasis who takes methotrexate presented to the hospital with generalized weakness, inability to walk, wounds on the legs and in her mouth. 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 Pt with severe mucositis, pain limiting tolerance of PO trials and limited ability to tolerate oral care.  Signficant xerostomia, dried bloody secretions noted orally with lesions. Attempts for gentle oral care again limited by significant pain from patient. Trialed single ice chip with momentary removal of NRB, suspected delay in swallow initation exhibited per palpation, reduced laryngeal elevation per palpation.  Given significant deconditioning, poor oral hygiene, recommend NPO.  SLP to closely monitor for PO readiness.   SLP Visit Diagnosis: Dysphagia, unspecified (R13.10)    Aspiration Risk  Moderate aspiration risk;Risk for inadequate  nutrition/hydration    Diet Recommendation   NPO  Medication Administration: Via alternative means    Other  Recommendations Oral Care Recommendations: Oral care QID   Follow up Recommendations        Frequency and Duration min 2x/week  2 weeks       Prognosis Prognosis for Safe Diet Advancement: Fair Barriers to Reach Goals: Severity of deficits      Swallow Study   General Date of Onset: 07/03/2019 HPI: 69 year old female with a past medical history significant for psoriasis who takes methotrexate presented to the hospital with generalized weakness, inability to walk, wounds on the legs and in her mouth. 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, pancytopenia and mucositis Type of Study: Bedside Swallow Evaluation Previous Swallow Assessment: none on file Diet Prior to this Study: NPO Temperature Spikes Noted: No Respiratory Status: Non-rebreather History of Recent Intubation: No Behavior/Cognition: Alert;Cooperative Oral Cavity Assessment: Lesions;Dry;Dried secretions (mucositis ) Oral Care Completed by SLP: Other (Comment) (attempted, limited tolerance due to odynophagia ) Oral Cavity - Dentition: Poor condition Vision: Functional for self-feeding Self-Feeding Abilities: Total assist Patient Positioning: Upright in bed Baseline Vocal Quality: Low vocal intensity Volitional Cough: Weak;Congested Volitional Swallow: Able to elicit    Oral/Motor/Sensory Function Overall Oral Motor/Sensory Function: Generalized oral weakness   Ice Chips Ice chips: Impaired Presentation: Spoon Oral Phase Impairments: Reduced labial seal;Reduced lingual movement/coordination Oral Phase Functional Implications: Prolonged oral transit;Oral residue Pharyngeal Phase Impairments: Suspected delayed Swallow;Multiple swallows;Change in Vital Signs   Thin Liquid Thin Liquid: Not tested    Nectar Thick Nectar Thick Liquid: Not tested   Honey Thick Honey Thick Liquid: Not  tested   Puree Puree: Not tested   Solid     Solid: Not tested      Karyl Kinnier MA, CCC-SLP  Acute Rehabilitation Services  07/14/2019,9:07 AM

## 2019-07-14 NOTE — Progress Notes (Signed)
Pt has been de-stating most of the day on my shift. Was able to bring stats up with repositioning. After some IV medications where administered she began to de-stat in the low 70-80% range with no LOC change. I called the rapid RN and respitory for further evaluation for possible higher level of care.

## 2019-07-14 NOTE — Significant Event (Addendum)
Rapid Response Event Note  Overview: Called d/t SpO2-80s on 35L 100% HHFNC and NRB. Pt had just been repositioned and cleaned up. SpO2 prior was 91%.   On day shift today, pt was placed on HHFNC in addition to the NRB she was already on d/t low SpO2. Per MD note, after discussion with family, pt was made DNR and, if deteriorates, will make comfortable.   Initial Focused Assessment: Pt laying in bed with eyes closed Pt breathing appears agonal, however, pt will awaken and nod/shake head to simple questions. Pt shakes head "no" when asked if in pain, she will nod "yes" when asked if she is having trouble breathing. Pt's R lung coarse t/o, L lung diminished. HR-75, BP-108/88 , RR-22, SpO2-73% on 35L HHFNC and NRB. [place] increased to 50L with SpO2 increasing to 80-83%.  Interventions: [place] increased to 50L 100% with NRB-SpO2 increased to 83% 60mg  lasix IV x 1 Morphine added to [facility] for air hunger Plan of Care (if not transferred): Pt looks like she is getting tired. I think increasing her HHFNC flow any further would increase her discomfort(pt has painful, crusted lesions in her nasal cavity). Pt is not a candidate for Bipap. Give lasix and morphine and monitor response. Family at bedside and updated on situation.  Event Summary:  Dr. Myna Hidalgo notified by bedside RN  Called: 1920 Arrived: 1923 Ended: [address]

## 2019-07-14 NOTE — Progress Notes (Signed)
NAME:  Kristy Dougherty, MRN:  [id number], DOB:  July 23, 1950, LOS: 4 ADMISSION DATE:  07/22/2019, CONSULTATION DATE:  07/13/19  REFERRING MD:  [place] Pink - [facility] , CHIEF COMPLAINT:  Malaise, non-healing leg wounds  Brief History   69 yo F with hx psoriatic arthritis on MTX, admitted with malaise and non-healing wounds,  found to be pancytopenic. Concern for MTX toxicity despite recent dose reduction.   History of present illness   69 yo F with PMH psoriatic arthritis on chronic MTX (dose reduced recently by nephrology due to CKD), CKD, HTN, DM2, HLD, OSA on CPAP who presented to ED 6/11 with CC weakness and non-healing leg wounds. Weakness has been progressive in nature x 1-1.5 months and has become significant enough to cause multiple recent falls, as well as worsening difficulty with ADLs. During ED evaluation, patient was noted to be pancytopenic, with WBC 1.5, Hgb 5.6, Plt 19-- no blasts noted.   Pt was transfused 2 PRBC and admitted to [facility] team for further management and care. On 6/11 Hematology was consulted for assistance managing pancytopenia, which was felt by heme to be most consistent with MTX toxicity and transfusion threshold recommendations were provided. On 6/13 pt developed increasing lethargy, new increased O2 requirement and some worsening of skin lesions. CXR revealed new R side opacity and pt started on vanc, zosyn for PNA.  PCCM was consulted regarding skin lesions, specifically if possible SJS.   Past Medical History   Psoriatic arthritis DM II CKD 3-4 HTN Prior tobacco use  OSA on CPAP HLD  Significant Hospital Events   6/13>PCCM consult for worsening rash -- new PNA 6/12> plt transfused for plt 13 6/11> Heme consult for pancytopenia  6/10> admitted to hospitalist team, 2 PRBC for hgb < 6  Consults:  Heme PCCM  Procedures:    Significant Diagnostic Tests:  6/13 CXR> New RLL opacity   Micro Data:  6/10 SARS Cov2> neg  6/13 BCx>>    Antimicrobials:  6/13  vanc> 6/13 zosyn>   Interim history/subjective:   She is on 100% nonrebreather mask, was on high flow nasal cannula 6/13.  She has nasal sinus mucosal lesion that is making it difficult to wear nasal cannula  Objective   Blood pressure (!) 115/51, pulse 77, temperature 98.2 F (36.8 C), temperature source Axillary, resp. rate (!) 23, height 5\' 9"  (1.753 m), weight 95.7 kg, SpO2 93 %.        Intake/Output Summary (Last 24 hours) at 07/14/2019 1105 Last data filed at 07/14/2019 0525 Gross per 24 hour  Intake 1469.43 ml  Output 550 ml  Net 919.43 ml   Filed Weights   07/03/2019 1607  Weight: 95.7 kg    Examination: General: Chronically ill appearing older adult F, reclined in bed NAD  HENT: NCAT. Dry tongue, tacky mm. Scattered oral lesions some with petechiae. Some raw, open lesions. No blistering of lips.  Lungs: R sided rhonchi. L side clear. Shallow, unlabored respirations. Symmetrical chest expansion  Cardiovascular: RRR s1s2 no rgm cap refill < 3seconds Abdomen: Soft obese ndnt normoactive x4 Extremities: Symmetrical bulk and tone. No cyanosis or clubbing  Neuro: Lethargic, awakens to voice, following commands GU: Purewick collecting amber urine. No sloughing or blistering lesions of labia  Skin: Numerous areas of open, non-bleeding skin lesions including Pannus, Groin folds, over L Trapezius, BLE. BLE lesions are dressed in xeroform + kerlix. Scattered petechial rash over chest, abdomen BUE BLE.   Resolved Hospital Problem list  Assessment & Plan:   Psoriatic Arthritis on methotrexate.  Now with evidence for chronic methotrexate toxicity with skin lesions, mucositis, cytopenias P Methotrexate is on hold Leucovorin initiated 6/13 Folate as ordered Gait literature search by Dr. [place] Bells and pharmacy regarding glucardiopase. No apparent role for this in the setting of chronic toxicity, deferred  Pancytopenia Stomatitis Skin lesions  P Significant difficulty with  nutrition as her oropharyngeal lesions are severe.  Continue to push nutrition as able.  Magic mouthwash and lidocaine ordered Hematology oncology input appreciated  Pseudomonas bacteremia.  Suspect that this is from her skin lesions, consider also from the HCAP.  Continue cefepime, also currently on vancomycin.  Could consider narrowing, DC vancomycin and follow  Acute respiratory failure with hypoxia due to HCAP, aspiration pneumonia  P Abx as above  Other problems: CKD HTN HLD  DM2  Frequent Falls -per primary team    Best practice:  Diet: Ice chips as tolerated. NPO if s/sx aspirating  Pain/Anxiety/Delirium protocol (if indicated): APAP, ultram, MM + lido VAP protocol (if indicated): na DVT prophylaxis: na due to degree of thrombocytopenia, BLE wounds GI prophylaxis: protonix Glucose control: SSI Mobility: BR Code Status: DNR Family Communication: daughter updated at bedside 6/14.  Disposition: SDU  Labs   CBC: Recent Labs  Lab 07/21/2019 1641 07/11/19 0647 07/11/19 1501 07/12/19 0407 07/12/19 1323 07/13/19 0340 07/14/19 0435  WBC 1.6*  1.5*  --  0.6* 0.2* 0.3* 0.3* 0.2*  NEUTROABS 1.0*  --  0.1* 0.0*  --  0.0* 0.1*  HGB 5.7*  5.7*   < > 6.8* 7.9* 7.9* 8.0* 7.9*  HCT 17.8*  17.9*   < > 20.3* 23.0* 23.2* 23.9* 24.2*  MCV 106.0*  107.2*  --  97.1 95.8 97.1 98.0 100.0  PLT 20*  19*  --  8* 16* 13* 25* 14*   < > = values in this interval not displayed.    Basic Metabolic Panel: Recent Labs  Lab 07/11/19 0327 07/11/19 0647 07/11/19 1501 07/12/19 0407 07/13/19 0340 07/14/19 0435  NA 139  --  138 140 140 144  K 2.8*  --  3.6 3.4* 3.7 3.8  CL 102  --  105 105 103 108  CO2 26  --  26 25 26 23   GLUCOSE 115*  --  149* 116* 127* 83  BUN 41*  --  44* 46* 48* 57*  CREATININE 2.53*  --  2.54* 2.50* 2.43* 2.83*  CALCIUM 7.6*  --  7.4* 7.4* 7.6* 7.6*  MG  --  1.3*  --  1.5*  --   --    GFR: Estimated Creatinine Clearance: 23.8 mL/min (A) (by C-G formula  based on SCr of 2.83 mg/dL (H)). Recent Labs  Lab 07/12/19 0407 07/12/19 1323 07/13/19 0340 07/14/19 0435  PROCALCITON  --   --   --  42.82  WBC 0.2* 0.3* 0.3* 0.2*    Liver Function Tests: Recent Labs  Lab 07/11/19 0327 07/12/19 0407 07/13/19 0340 07/14/19 0435  AST 25 29 23 21   ALT 17 17 13 11   ALKPHOS 181* 226* 145* 81  BILITOT 2.2* 2.5* 1.9* 2.3*  PROT 4.3* 4.8* 4.7* 4.3*  ALBUMIN 1.6* 1.7* 1.4* 1.1*   No results for input(s): LIPASE, AMYLASE in the last 168 hours. No results for input(s): AMMONIA in the last 168 hours.  ABG    Component Value Date/Time   PHART 7.464 (H) 07/13/2019 0850   PCO2ART 39.3 07/13/2019 0850   PO2ART 62.8 (L) 07/13/2019 7342  HCO3 27.3 07/13/2019 0850   O2SAT 90.2 07/13/2019 0850     Coagulation Profile: No results for input(s): INR, PROTIME in the last 168 hours.  Cardiac Enzymes: No results for input(s): CKTOTAL, CKMB, CKMBINDEX, TROPONINI in the last 168 hours.  HbA1C: Hgb A1c MFr Bld  Date/Time Value Ref Range Status  07/11/2019 03:27 AM 6.0 (H) 4.8 - 5.6 % Final    Comment:    (NOTE) Pre diabetes:          5.7%-6.4%  Diabetes:              >6.4%  Glycemic control for   <7.0% adults with diabetes     CBG: Recent Labs  Lab 07/13/19 0802 07/13/19 1200 07/13/19 2107 07/14/19 0421 07/14/19 0748  GLUCAP 113* 101* 86 85 81     Baltazar Apo, MD, PhD 07/14/2019, 11:15 AM [facility] [phone] or if no answer [phone]

## 2019-07-14 NOTE — Significant Event (Addendum)
Rapid Response Event Note  Overview: Time Called: 1509 Arrival Time: 1511 Event Type: Respiratory Acute oxygen desaturation. 75-84% on 100% NRB.   Initial Focused Assessment: Pt lying in bed. Oriented to person, place & time. Pt is able to follow commands. Inspiratory and expiratory wheezing to the right lung. Diminished on the left. Pulses 2+. Sores in pts oral cavity and nose, dried blood noted around pt's mouth.   Interventions: -ABG  7.348/ 47.7/ 50.1/ 25.6 -CXR initially ordered, then cancelled once MD was at bedside to evaluate pt -35L 100% HHFNC & 100% NRB  Plan of Care (if not transferred): -Maintain oxygen saturation >88% -Aspiration precautions  Call rapid response for additional needs.  Event Summary: Name of Physician Notified: Dr. [place] Pink at [place] Outcome: Stayed in room and stabalized Event End Time: [place]

## 2019-07-14 NOTE — Progress Notes (Signed)
PHARMACY - PHYSICIAN COMMUNICATION CRITICAL VALUE ALERT - BLOOD CULTURE IDENTIFICATION (BCID)  Kristy Dougherty is an 69 y.o. female who presented to [facility] on 07/18/2019 with a chief complaint of weakness and skin lesions.  Assessment:  Begun treatment for MTX toxicity, started on broad-spectrum ABX vanc and Zosyn for concern for PNA, now w/ Peudomonas aeruginosa growing in 1/2 blood cx bottles, covered w/ Zosyn though further concern given immunosuppressed state.  Name of physician (or Provider) Contacted: *TOpyd  Current antibiotics: vancomycin and Zosyn  Changes to prescribed antibiotics recommended:  Will not change ABX for now.  Results for orders placed or performed during the hospital encounter of 07/21/2019  Blood Culture ID Panel (Reflexed) (Collected: 07/13/2019 10:03 AM)  Result Value Ref Range   Enterococcus species NOT DETECTED NOT DETECTED   Listeria monocytogenes NOT DETECTED NOT DETECTED   Staphylococcus species NOT DETECTED NOT DETECTED   Staphylococcus aureus (BCID) NOT DETECTED NOT DETECTED   Streptococcus species NOT DETECTED NOT DETECTED   Streptococcus agalactiae NOT DETECTED NOT DETECTED   Streptococcus pneumoniae NOT DETECTED NOT DETECTED   Streptococcus pyogenes NOT DETECTED NOT DETECTED   Acinetobacter baumannii NOT DETECTED NOT DETECTED   Enterobacteriaceae species NOT DETECTED NOT DETECTED   Enterobacter cloacae complex NOT DETECTED NOT DETECTED   Escherichia coli NOT DETECTED NOT DETECTED   Klebsiella oxytoca NOT DETECTED NOT DETECTED   Klebsiella pneumoniae NOT DETECTED NOT DETECTED   Proteus species NOT DETECTED NOT DETECTED   Serratia marcescens NOT DETECTED NOT DETECTED   Carbapenem resistance NOT DETECTED NOT DETECTED   Haemophilus influenzae NOT DETECTED NOT DETECTED   Neisseria meningitidis NOT DETECTED NOT DETECTED   Pseudomonas aeruginosa DETECTED (A) NOT DETECTED   Candida albicans NOT DETECTED NOT DETECTED   Candida glabrata NOT  DETECTED NOT DETECTED   Candida krusei NOT DETECTED NOT DETECTED   Candida parapsilosis NOT DETECTED NOT DETECTED   Candida tropicalis NOT DETECTED NOT DETECTED    Wynona Neat, PharmD, BCPS  07/14/2019  6:04 AM

## 2019-07-15 DIAGNOSIS — R7881 Bacteremia: Secondary | ICD-10-CM

## 2019-07-15 DIAGNOSIS — T451X1A Poisoning by antineoplastic and immunosuppressive drugs, accidental (unintentional), initial encounter: Secondary | ICD-10-CM

## 2019-07-15 DIAGNOSIS — B965 Pseudomonas (aeruginosa) (mallei) (pseudomallei) as the cause of diseases classified elsewhere: Secondary | ICD-10-CM

## 2019-07-15 LAB — PREPARE PLATELET PHERESIS: Unit division: 0

## 2019-07-15 LAB — BPAM PLATELET PHERESIS
Blood Product Expiration Date: 202106152359
ISSUE DATE / TIME: 202106141346
Unit Type and Rh: 5100

## 2019-07-16 LAB — CULTURE, BLOOD (ROUTINE X 2)

## 2019-07-16 LAB — METHOTREXATE

## 2019-07-17 ENCOUNTER — Ambulatory Visit: Payer: Medicare Other | Admitting: Physician Assistant

## 2019-07-22 ENCOUNTER — Ambulatory Visit: Payer: Medicare Other | Admitting: Physician Assistant

## 2019-07-31 NOTE — Progress Notes (Signed)
Notified MD of patient sats in the 60's, will administer morphine as ordered.

## 2019-07-31 NOTE — Progress Notes (Signed)
Notified MD that patient is actively dying.  Dr. Myna Hidalgo to see patient.

## 2019-07-31 NOTE — Progress Notes (Signed)
Notified MD of decreased no urine output. Orders received.

## 2019-07-31 NOTE — Progress Notes (Signed)
Absence of respirations and pulse.  Time of death 1:03 Pronounced by Dr. Myna Hidalgo and Bernette Redbird, RN

## 2019-07-31 NOTE — Death Summary Note (Addendum)
Death Summary  Kristy Dougherty LGX:[id number] DOB: 05-Mar-1950 DOA: 2019/08/04  PCP: [name], MD  Admit date: 2019/08/04 Date of Death: 2019/08/09 Time of Death: 01:03  Notification: [name], MD notified of death of Aug 09, 2019   History of present illness:  Kristy Dougherty is a 69 y.o. female with a history of psoriatic arthritis on methotrexate, type 2 diabetes mellitus, hypertension, and chronic kidney disease stage III-IV who presented to the emergency department on 08/04/2019 after several weeks of progressive general weakness with difficulty performing her ADLs.  She is found to be edematous with skin ulcerations involving her extremities and mouth, as well as a new pancytopenia.  She was seen by hematology-oncology who attributed the pancytopenia to methotrexate toxicity and recommended supportive care and transfusions.  Pancytopenia persisted and she was started on Granix.  She was transfused multiple units of RBC and platelets.  Methotrexate was held, she was treated with leucovorin, and possible treatment with glucarpidase was discussed with pharmacist here and at an academic center who advised that it is mainly used for acute IV methotrexate toxicity rather than this case of apparent chronic toxicity related to oral MTX.   Blood cultures were positive for Pseudomonas for which she was started on antibiotics.  She developed acute hypoxic respiratory failure that was suspected secondary to aspiration and was seen by pulmonology/critical care for this reason on 07/13/2019.  The patient and her family indicated that she would not want to undergo mechanical ventilation and she was continued on supplemental oxygen and broad-spectrum antibiotics.  Patient became increasingly lethargic with worsening respiratory status, continued to indicate that she would not want to be intubated.  She continued to worsen unfortunately and her daughter, not wanting the patient to suffer, felt  that it would be in her best interest to change CODE STATUS to DNR on 07/14/2019.  Patient's daughter and sister were aware that the prognosis was becoming quite grim and asked that if the patient continued to worsen, that goal of care be transitioned to comfort only.  The patient did continue to worsen, stopped urinating, did not respond to attempts at diuresis, and expired the early morning of 08-09-19 in the presence of her daughter and sister.   Final Diagnoses:  1.   Methotrexate toxicity  2.   Pseudomonas bacteremia  3.   Aspiration pneumonia  4.   Acute hypoxic respiratory failure  5.   Acute kidney injury superimposed on CKD IIIb or IV  6.   Pancytopenia    The results of significant diagnostics from this hospitalization (including imaging, microbiology, ancillary and laboratory) are listed below for reference.    Significant Diagnostic Studies: DG CHEST PORT 1 VIEW  Result Date: 07/13/2019 CLINICAL DATA:  Oxygen desaturation EXAM: PORTABLE CHEST 1 VIEW COMPARISON:  Aug 04, 2019 FINDINGS: There is airspace opacity in the right lower lung region. More subtle airspace opacity noted in the right perihilar region. There is atelectatic change in the left base with equivocal small left pleural effusion. Lungs are otherwise clear. The heart is upper normal in size with pulmonary vascularity normal. No adenopathy. No pneumothorax. No bone lesions. IMPRESSION: New airspace opacity throughout the right lower lung region consistent with pneumonia. More subtle airspace opacity in the right perihilar region, also likely due to pneumonia. Equivocal small left pleural effusion with left base atelectasis. Lungs elsewhere clear. Stable cardiac silhouette. No adenopathy evident. Electronically Signed   By: Lowella Grip III M.D.   On: 07/13/2019 08:32   DG  Chest Port 1 View  Result Date: 07/07/2019 CLINICAL DATA:  Generalized weakness for 3 weeks EXAM: PORTABLE CHEST 1 VIEW COMPARISON:  11/07/2017  FINDINGS: Single frontal view of the chest demonstrates a stable cardiac silhouette. No airspace disease, effusion, or pneumothorax. Minimally displaced right posterior seventh rib fracture is noted. IMPRESSION: 1. Minimally displaced right posterior seventh rib fracture. 2. Otherwise no acute intrathoracic process. Electronically Signed   By: Randa Ngo M.D.   On: 07/19/2019 19:17    Microbiology: Recent Results (from the past 240 hour(s))  SARS Coronavirus 2 by RT PCR (hospital order, performed in [facility] hospital lab) Nasopharyngeal Nasopharyngeal Swab     Status: None   Collection Time: 07/16/2019  6:31 PM   Specimen: Nasopharyngeal Swab  Result Value Ref Range Status   SARS Coronavirus 2 NEGATIVE NEGATIVE Final    Comment: (NOTE) SARS-CoV-2 target nucleic acids are NOT DETECTED.  The SARS-CoV-2 RNA is generally detectable in upper and lower respiratory specimens during the acute phase of infection. The lowest concentration of SARS-CoV-2 viral copies this assay can detect is 250 copies / mL. A negative result does not preclude SARS-CoV-2 infection and should not be used as the sole basis for treatment or other patient management decisions.  A negative result may occur with improper specimen collection / handling, submission of specimen other than nasopharyngeal swab, presence of viral mutation(s) within the areas targeted by this assay, and inadequate number of viral copies (<250 copies / mL). A negative result must be combined with clinical observations, patient history, and epidemiological information.  Fact Sheet for Patients:   StrictlyIdeas.no  Fact Sheet for Healthcare Providers: BankingDealers.co.za  This test is not yet approved or  cleared by the [place] FDA and has been authorized for detection and/or diagnosis of SARS-CoV-2 by FDA under an Emergency Use Authorization (EUA).  This EUA will remain in effect  (meaning this test can be used) for the duration of the COVID-19 declaration under Section 564(b)(1) of the Act, 21 U.S.C. section 360bbb-3(b)(1), unless the authorization is terminated or revoked sooner.  Performed at [facility], [place] [address] [place], [place] [address]   Culture, blood (Routine X 2) w Reflex to ID Panel     Status: None (Preliminary result)   Collection Time: 07/13/19 10:03 AM   Specimen: BLOOD LEFT FOREARM  Result Value Ref Range Status   Specimen Description BLOOD LEFT FOREARM  Final   Special Requests   Final    BOTTLES DRAWN AEROBIC ONLY Blood Culture results may not be optimal due to an inadequate volume of blood received in culture bottles   Culture  Setup Time   Final    GRAM NEGATIVE RODS AEROBIC BOTTLE ONLY Organism ID to follow CRITICAL RESULT CALLED TO, READ BACK BY AND VERIFIED WITH: PHRMD V BRYK @[id number]  07/14/19 BY S GEZAHEGN Performed at [facility], [place] [address], [place],  [address]    Culture GRAM NEGATIVE RODS  Final   Report Status PENDING  Incomplete  Blood Culture ID Panel (Reflexed)     Status: Abnormal   Collection Time: 07/13/19 10:03 AM  Result Value Ref Range Status   Enterococcus species NOT DETECTED NOT DETECTED Final   Listeria monocytogenes NOT DETECTED NOT DETECTED Final   Staphylococcus species NOT DETECTED NOT DETECTED Final   Staphylococcus aureus (BCID) NOT DETECTED NOT DETECTED Final   Streptococcus species NOT DETECTED NOT DETECTED Final   Streptococcus agalactiae NOT DETECTED NOT DETECTED Final  Streptococcus pneumoniae NOT DETECTED NOT DETECTED Final   Streptococcus pyogenes NOT DETECTED NOT DETECTED Final   Acinetobacter baumannii NOT DETECTED NOT DETECTED Final   Enterobacteriaceae species NOT DETECTED NOT DETECTED Final   Enterobacter cloacae complex NOT DETECTED NOT DETECTED Final   Escherichia coli NOT DETECTED NOT DETECTED Final   Klebsiella oxytoca NOT DETECTED NOT DETECTED Final    Klebsiella pneumoniae NOT DETECTED NOT DETECTED Final   Proteus species NOT DETECTED NOT DETECTED Final   Serratia marcescens NOT DETECTED NOT DETECTED Final   Carbapenem resistance NOT DETECTED NOT DETECTED Final   Haemophilus influenzae NOT DETECTED NOT DETECTED Final   Neisseria meningitidis NOT DETECTED NOT DETECTED Final   Pseudomonas aeruginosa DETECTED (A) NOT DETECTED Final    Comment: CRITICAL RESULT CALLED TO, READ BACK BY AND VERIFIED WITH: PHRMD V BRYK @0554  07/14/19 BY S GEZAHEGN    Candida albicans NOT DETECTED NOT DETECTED Final   Candida glabrata NOT DETECTED NOT DETECTED Final   Candida krusei NOT DETECTED NOT DETECTED Final   Candida parapsilosis NOT DETECTED NOT DETECTED Final   Candida tropicalis NOT DETECTED NOT DETECTED Final    Comment: Performed at [facility], [place] [address], [place], [place] [address]  Culture, blood (Routine X 2) w Reflex to ID Panel     Status: None (Preliminary result)   Collection Time: 07/13/19 10:04 AM   Specimen: BLOOD LEFT FOREARM  Result Value Ref Range Status   Specimen Description BLOOD LEFT FOREARM  Final   Special Requests   Final    BOTTLES DRAWN AEROBIC ONLY Blood Culture results may not be optimal due to an inadequate volume of blood received in culture bottles   Culture  Setup Time   Final    GRAM NEGATIVE RODS AEROBIC BOTTLE ONLY CRITICAL VALUE NOTED.  VALUE IS CONSISTENT WITH PREVIOUSLY REPORTED AND CALLED VALUE. Performed at [facility], [place] [address] [place], [place] [address]    Culture GRAM NEGATIVE RODS  Final   Report Status PENDING  Incomplete  MRSA PCR Screening     Status: None   Collection Time: 07/14/19 12:45 PM   Specimen: Nasal Mucosa; Nasopharyngeal  Result Value Ref Range Status   MRSA by PCR NEGATIVE NEGATIVE Final    Comment:        The GeneXpert MRSA Assay (FDA approved for NASAL specimens only), is one component of a comprehensive MRSA colonization surveillance program. It is  not intended to diagnose MRSA infection nor to guide or monitor treatment for MRSA infections. Performed at [facility], [place] [address] [place], [place] [address]      Labs: Basic Metabolic Panel: Recent Labs  Lab 07/11/19 0327 07/11/19 0327 07/11/19 1324 07/11/19 1501 07/11/19 1501 07/12/19 0407 07/12/19 0407 07/13/19 0340 07/14/19 0435  NA 139  --   --  138  --  140  --  140 144  K 2.8*   < >  --  3.6   < > 3.4*   < > 3.7 3.8  CL 102  --   --  105  --  105  --  103 108  CO2 26  --   --  26  --  25  --  26 23  GLUCOSE 115*  --   --  149*  --  116*  --  127* 83  BUN 41*  --   --  44*  --  46*  --  48* 57*  CREATININE 2.53*  --   --  2.54*  --  2.50*  --  2.43* 2.83*  CALCIUM 7.6*  --   --  7.4*  --  7.4*  --  7.6* 7.6*  MG  --   --  1.3*  --   --  1.5*  --   --   --    < > = values in this interval not displayed.   Liver Function Tests: Recent Labs  Lab 07/11/19 0327 07/12/19 0407 07/13/19 0340 07/14/19 0435  AST 25 29 23 21   ALT 17 17 13 11   ALKPHOS 181* 226* 145* 81  BILITOT 2.2* 2.5* 1.9* 2.3*  PROT 4.3* 4.8* 4.7* 4.3*  ALBUMIN 1.6* 1.7* 1.4* 1.1*   No results for input(s): LIPASE, AMYLASE in the last 168 hours. No results for input(s): AMMONIA in the last 168 hours. CBC: Recent Labs  Lab 07/18/2019 1641 07/11/19 0647 07/11/19 1501 07/12/19 0407 07/12/19 1323 07/13/19 0340 07/14/19 0435  WBC 1.6*  1.5*  --  0.6* 0.2* 0.3* 0.3* 0.2*  NEUTROABS 1.0*  --  0.1* 0.0*  --  0.0* 0.1*  HGB 5.7*  5.7*   < > 6.8* 7.9* 7.9* 8.0* 7.9*  HCT 17.8*  17.9*   < > 20.3* 23.0* 23.2* 23.9* 24.2*  MCV 106.0*  107.2*  --  97.1 95.8 97.1 98.0 100.0  PLT 20*  19*  --  8* 16* 13* 25* 14*   < > = values in this interval not displayed.   Cardiac Enzymes: No results for input(s): CKTOTAL, CKMB, CKMBINDEX, TROPONINI in the last 168 hours. D-Dimer No results for input(s): DDIMER in the last 72 hours. BNP: Invalid input(s): POCBNP CBG: Recent Labs  Lab  07/13/19 2107 07/14/19 0421 07/14/19 0748 07/14/19 1156 07/14/19 1742  GLUCAP 86 85 81 82 89   Anemia work up Recent Labs    07/12/19 1323  VITAMINB12 635  FOLATE 47.7   Urinalysis    Component Value Date/Time   COLORURINE AMBER (A) 07/11/2019 0444   APPEARANCEUR CLEAR 07/11/2019 0444   LABSPEC 1.013 07/11/2019 0444   PHURINE 5.0 07/11/2019 0444   GLUCOSEU 50 (A) 07/11/2019 0444   HGBUR MODERATE (A) 07/11/2019 0444   BILIRUBINUR NEGATIVE 07/11/2019 0444   KETONESUR NEGATIVE 07/11/2019 0444   PROTEINUR >=300 (A) 07/11/2019 0444   NITRITE NEGATIVE 07/11/2019 0444   LEUKOCYTESUR MODERATE (A) 07/11/2019 0444   Sepsis Labs Invalid input(s): PROCALCITONIN,  WBC,  LACTICIDVEN     SIGNED:  Vianne Bulls, MD  [facility] 2019/07/26, 1:56 AM Pager   If 7PM-7AM, please contact night-coverage [email or web address] Password [email or web address]

## 2019-07-31 DEATH — deceased

## 2020-09-10 IMAGING — CT CT CHEST W/O CM
2 of 3 series · 13 of 42 positions shown, 18 images · non-contrast
Comparison: CT abdomen pelvis dated 08/22/2017 and chest radiograph
dated 11/07/2017.

CLINICAL DATA: 68-year-old female with chest pain and shortness of
breath. Recent fall.

EXAM:
CT CHEST, ABDOMEN AND PELVIS WITHOUT CONTRAST
TECHNIQUE: Multidetector CT imaging of the chest, abdomen and pelvis was
performed following the standard protocol without IV contrast.

[Series 2: chest/abd/pelvis w/(date) · axial · 1.27mm/px · z∈[-637,-67]mm · 10 of 134 slices shown, 15 images]
[im 10/134  soft-tissue]
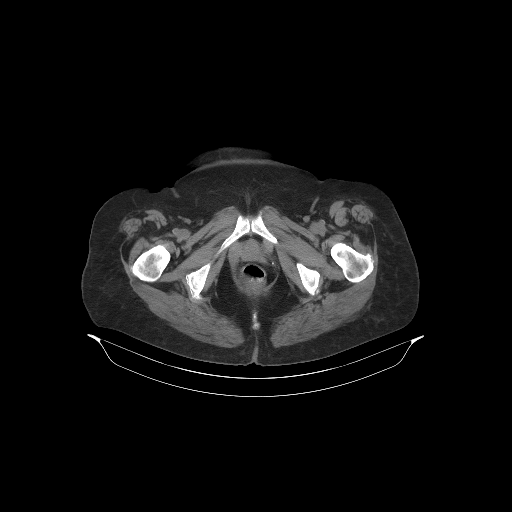
[im 10/134  bone]
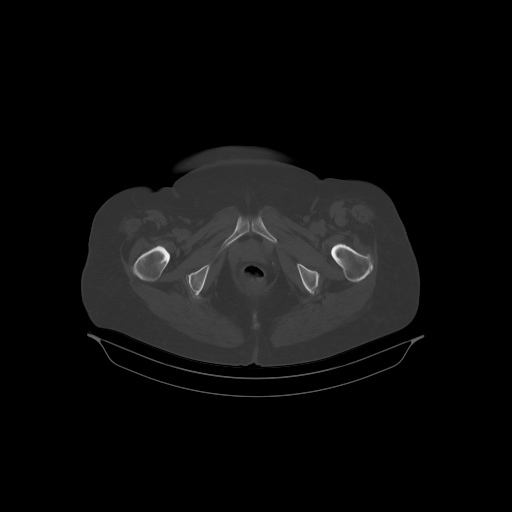
[im 29/134  soft-tissue]
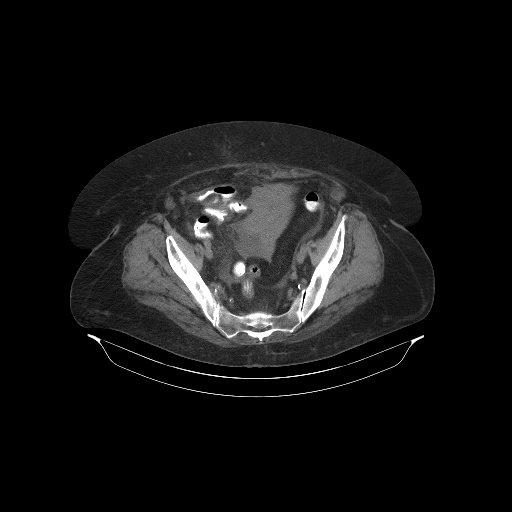
[im 39/134  soft-tissue]
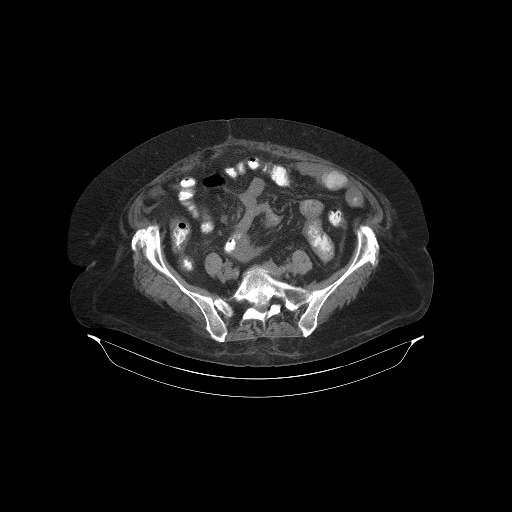
[im 58/134  soft-tissue]
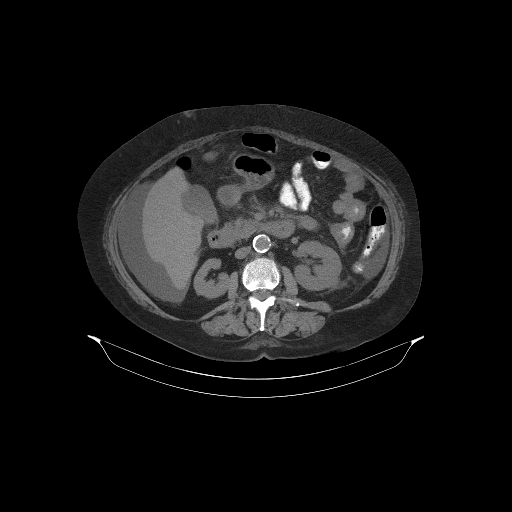
[im 67/134  soft-tissue]
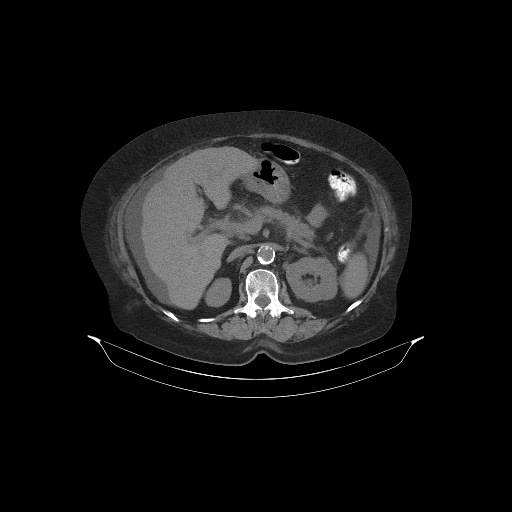
[im 77/134  soft-tissue]
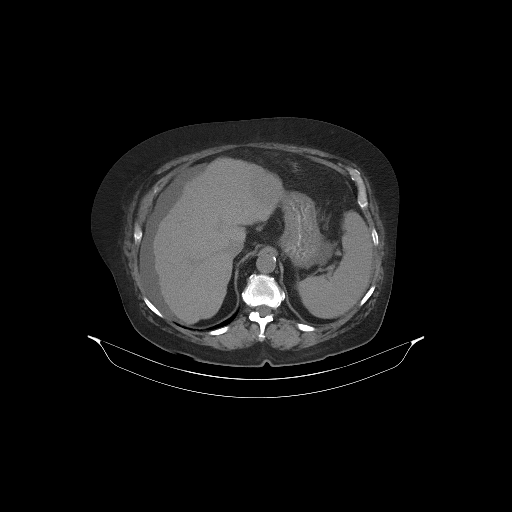
[im 96/134  soft-tissue]
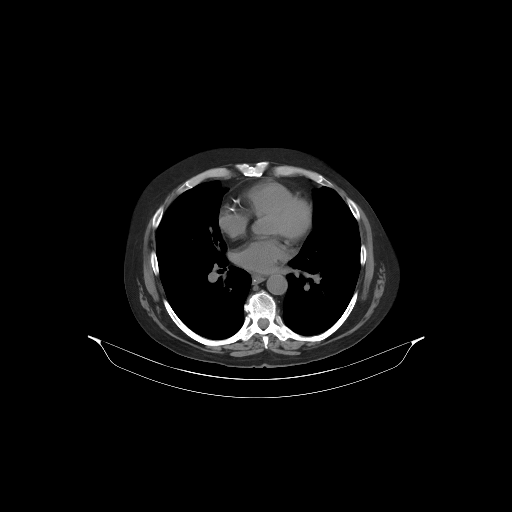
[im 96/134  lung]
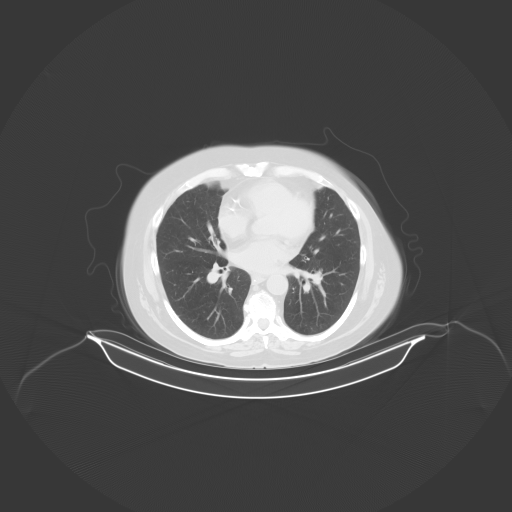
[im 105/134  soft-tissue]
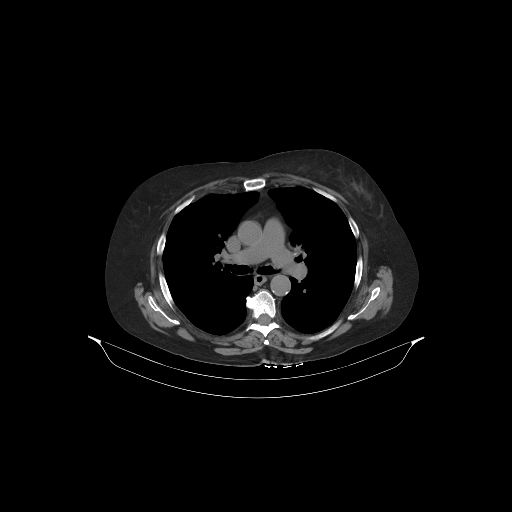
[im 105/134  lung]
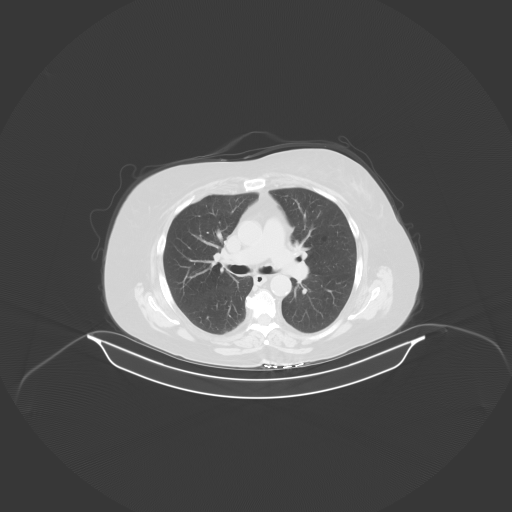
[im 115/134  lung]
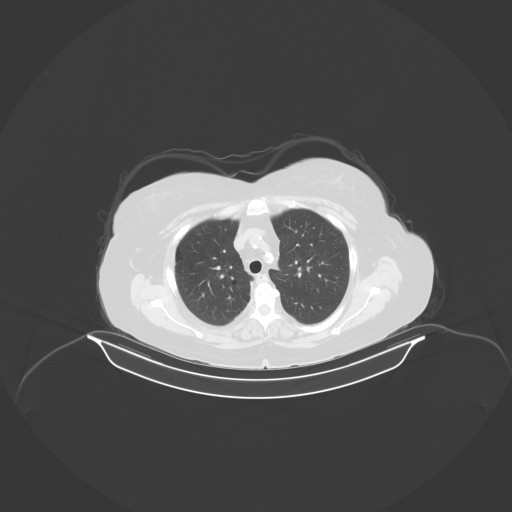
[im 124/134  soft-tissue]
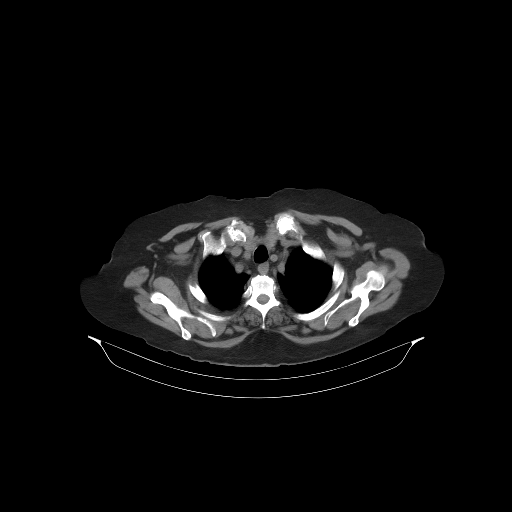
[im 124/134  lung]
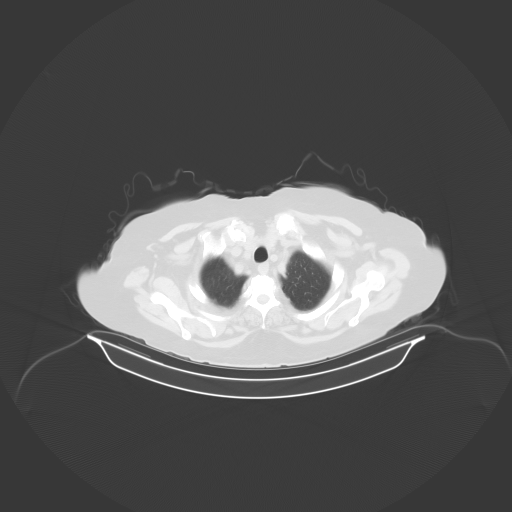
[im 124/134  bone]
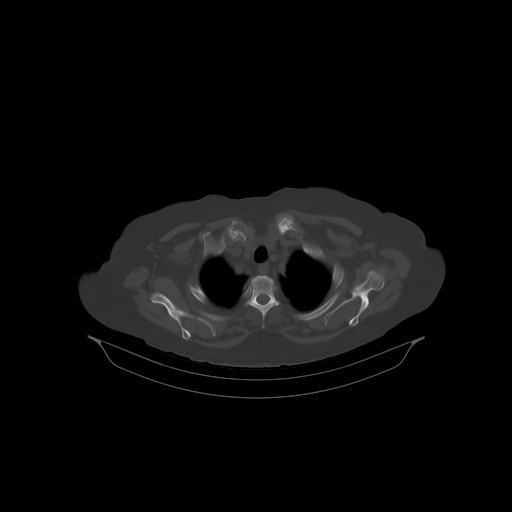

[Series 602: cor cap · coronal · 1.30mm/px · 3 of 178 slices shown]
[im 60/178  soft-tissue]
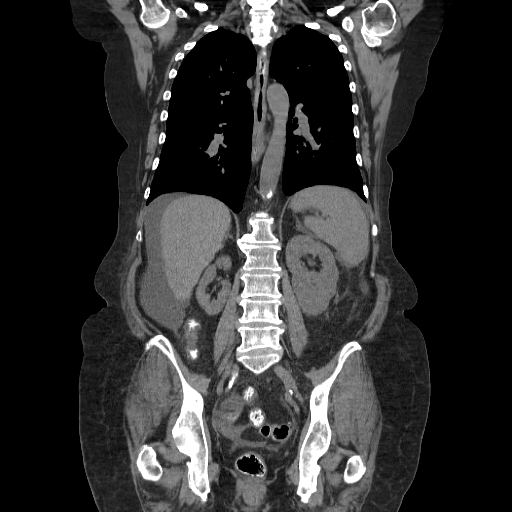
[im 79/178  soft-tissue]
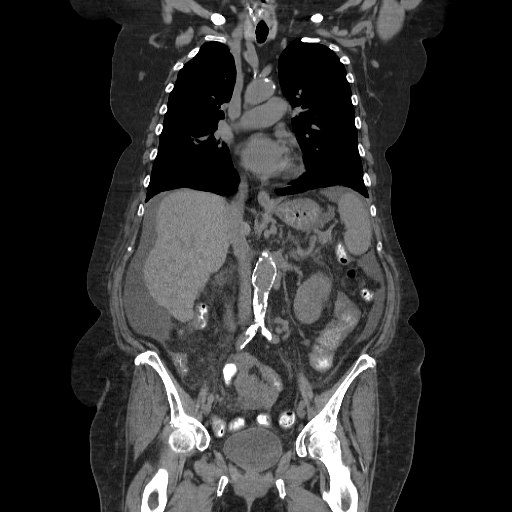
[im 99/178  soft-tissue]
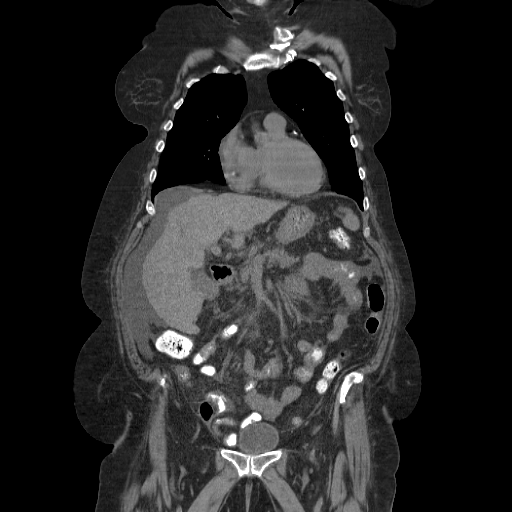

[13 of 42 positions shown; findings below may reference images not displayed]

FINDINGS: Evaluation of this exam is limited in the absence of intravenous
contrast.

CT CHEST FINDINGS

Cardiovascular: There is no cardiomegaly or pericardial effusion.
There is 3 vessel coronary vascular calcification. Moderate
atherosclerotic calcification of the thoracic aorta. Mild dilatation
of the main pulmonary trunk may represent a degree of pulmonary
hypertension. Clinical correlation is recommended.

Mediastinum/Nodes: There is no hilar or mediastinal adenopathy. The
esophagus and the thyroid gland are grossly unremarkable. No
mediastinal fluid collection.

Lungs/Pleura: Mild emphysema. There is no focal consolidation,
pleural effusion, or pneumothorax. The central airways are patent.

Musculoskeletal: Degenerative changes of the spine. No acute osseous
pathology.

CT ABDOMEN PELVIS FINDINGS

There is no intra-abdominal free air. There is a small ascites.

Hepatobiliary: Cirrhosis. Ill-defined low attenuating area in the
left lobe of the liver measuring approximately 4 x 5.5 cm is not
well characterized but may represent an area of fatty infiltration.
A mass is not excluded. Further initial evaluation with ultrasound
is recommended. No intrahepatic biliary ductal dilatation. Probable
small amount of sludge within the gallbladder.

Pancreas: Unremarkable. No pancreatic ductal dilatation or
surrounding inflammatory changes.

Spleen: Normal in size without focal abnormality.

Adrenals/Urinary Tract: The adrenal glands are unremarkable. There
is no hydronephrosis or nephrolithiasis on either side. There is
mild right renal parenchyma atrophy. The visualized ureters and
urinary bladder appear unremarkable.

Stomach/Bowel: Mildly thickened gastric antrum may be related to
underdistention or ascites. Focal inflammation or pelvic ulcer is
not excluded. Clinical correlation is recommended. There is no bowel
obstruction. The appendix is normal.

Vascular/Lymphatic: There is advanced aortoiliac atherosclerotic
disease. There is a 3 cm infrarenal aortic aneurysm. The IVC is
unremarkable. No portal venous gas. There is no adenopathy.

Reproductive: Hysterectomy.

Other: None

Musculoskeletal: Degenerative changes of the spine. No acute osseous
pathology.
IMPRESSION: 1. No acute intrathoracic pathology.
2. Cirrhosis with small ascites.
3. Ill-defined low attenuating area in the left lobe of the liver
may represent an area of fatty infiltration. A mass is not excluded.
Further initial evaluation with ultrasound is recommended.
4. Mildly thickened gastric antrum may be related to underdistention
or ascites. Focal inflammation or pelvic ulcer is not excluded.
Clinical correlation is recommended. No bowel obstruction. Normal
appendix.
5. Aortic Atherosclerosis (LY9UE-AGE.E) and Emphysema (LY9UE-5DQ.G).

## 2020-09-10 IMAGING — CT CT ABD-PELV W/O CM
2 of 3 series · 13 of 42 positions shown, 18 images · non-contrast
Comparison: CT abdomen pelvis dated 08/22/2017 and chest radiograph
dated 11/07/2017.

CLINICAL DATA: 68-year-old female with chest pain and shortness of
breath. Recent fall.

EXAM:
CT CHEST, ABDOMEN AND PELVIS WITHOUT CONTRAST
TECHNIQUE: Multidetector CT imaging of the chest, abdomen and pelvis was
performed following the standard protocol without IV contrast.

[Series 2: chest/abd/pelvis w/(date) · axial · 1.27mm/px · z∈[-637,-67]mm · 10 of 134 slices shown, 15 images]
[im 10/134  soft-tissue]
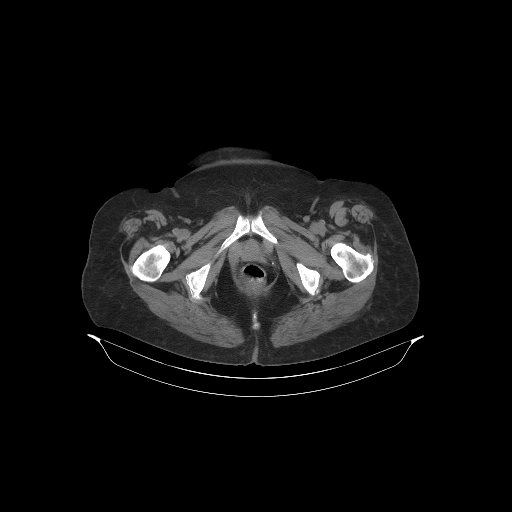
[im 10/134  bone]
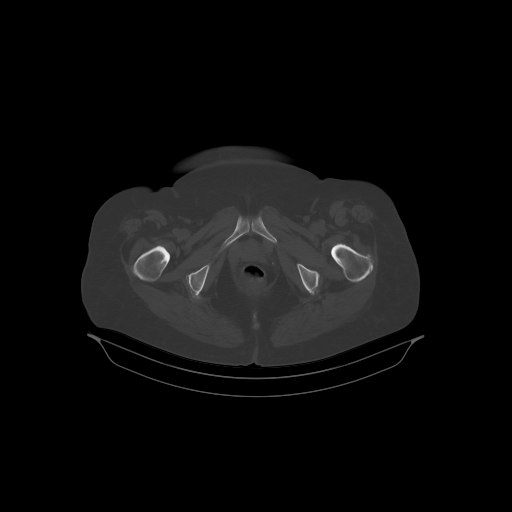
[im 29/134  soft-tissue]
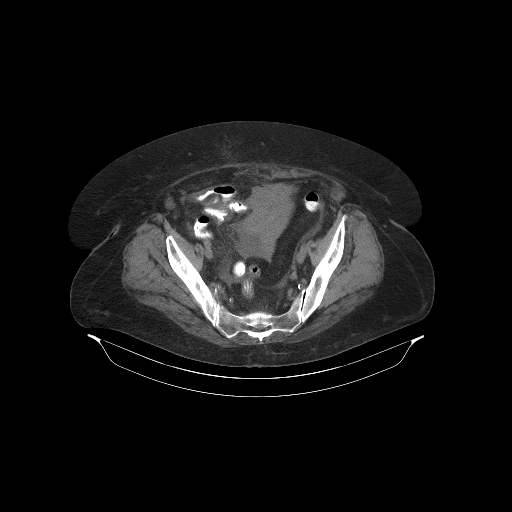
[im 39/134  soft-tissue]
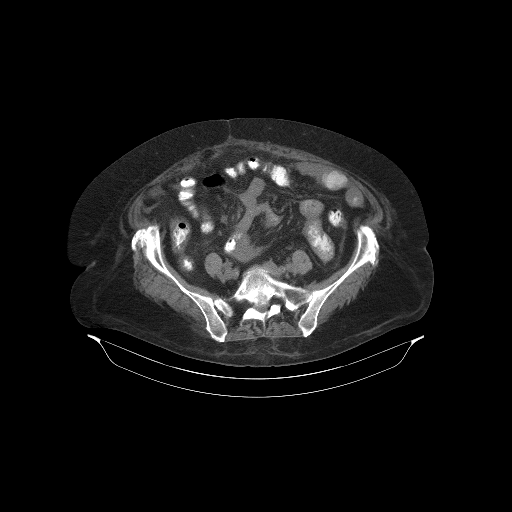
[im 58/134  soft-tissue]
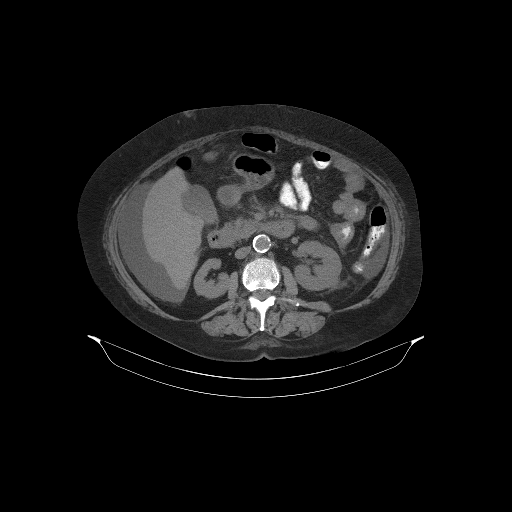
[im 67/134  soft-tissue]
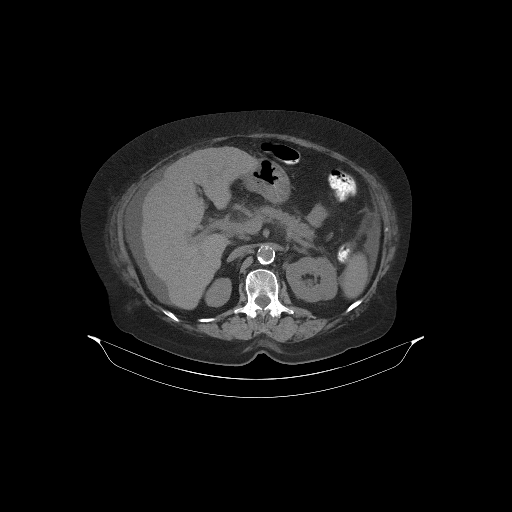
[im 77/134  soft-tissue]
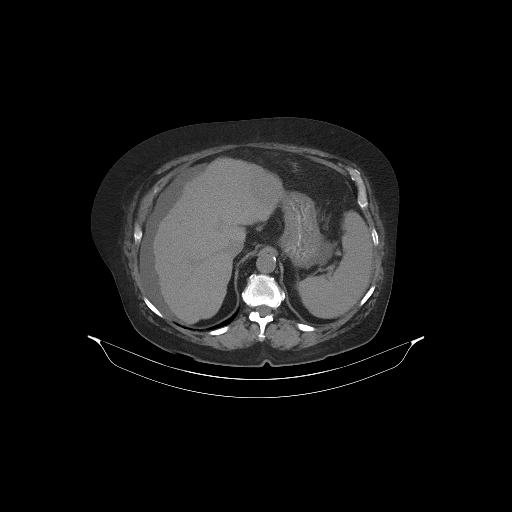
[im 96/134  soft-tissue]
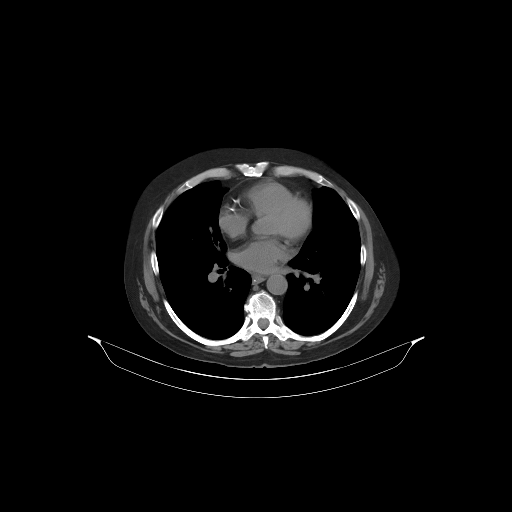
[im 96/134  lung]
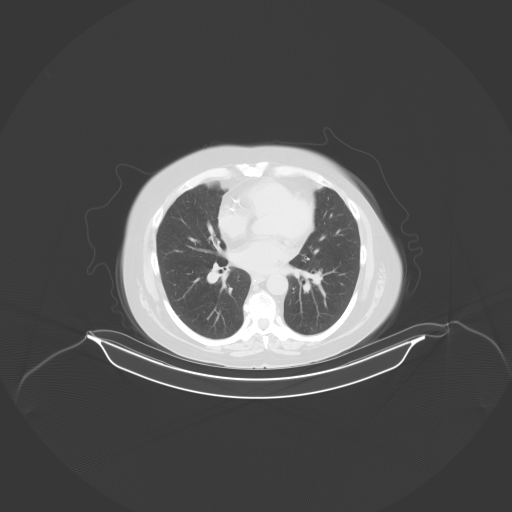
[im 105/134  soft-tissue]
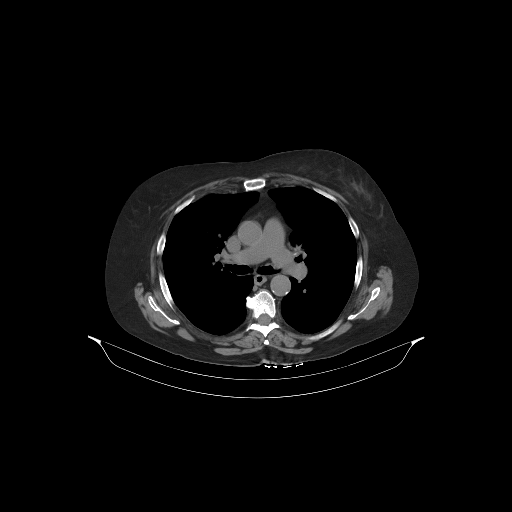
[im 105/134  lung]
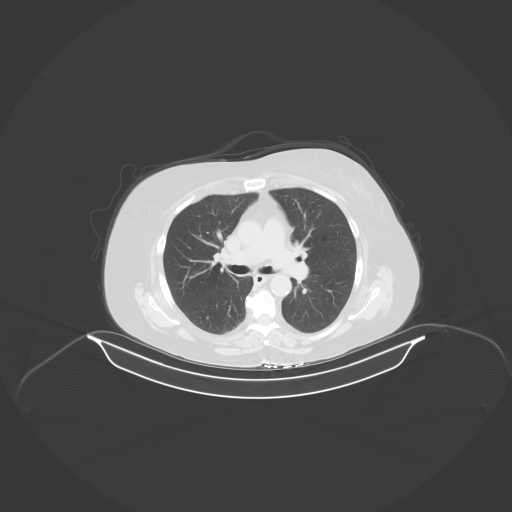
[im 115/134  lung]
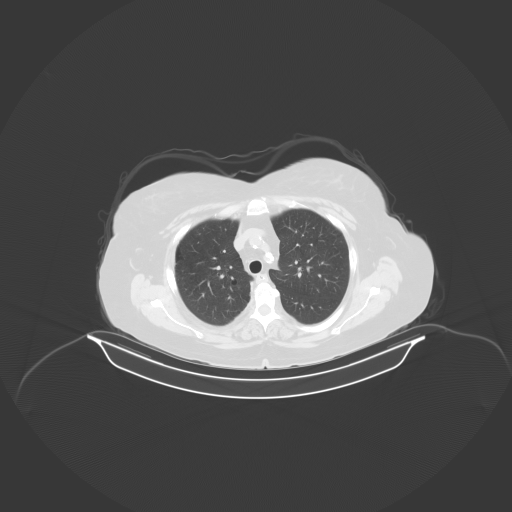
[im 124/134  soft-tissue]
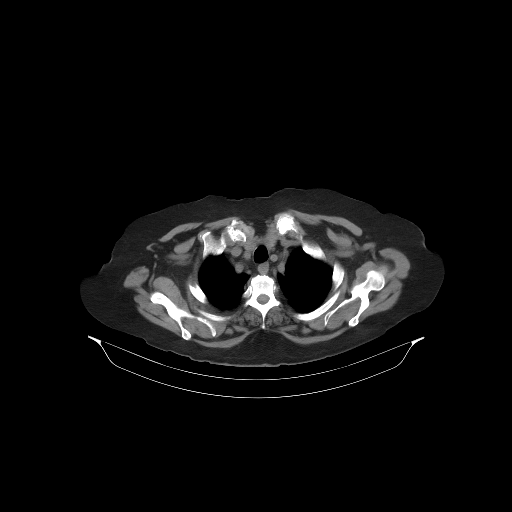
[im 124/134  lung]
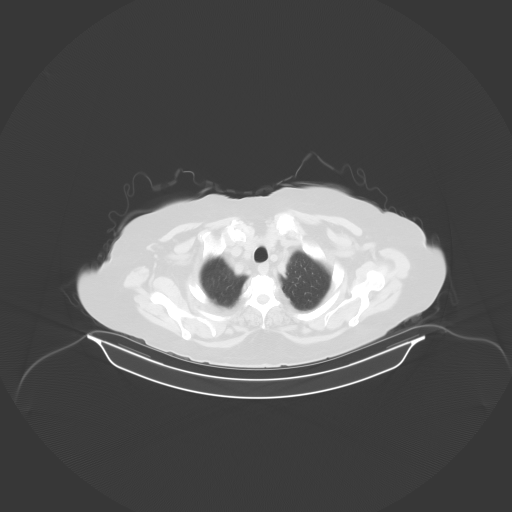
[im 124/134  bone]
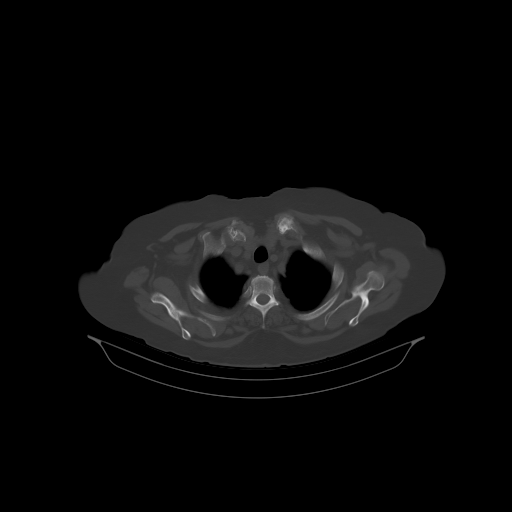

[Series 602: cor cap · coronal · 1.30mm/px · 3 of 178 slices shown]
[im 60/178  soft-tissue]
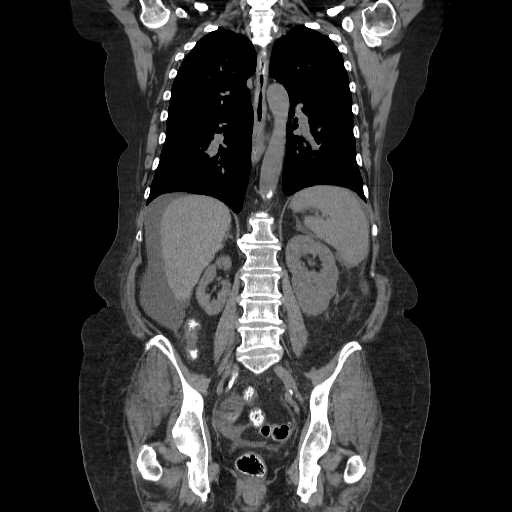
[im 79/178  soft-tissue]
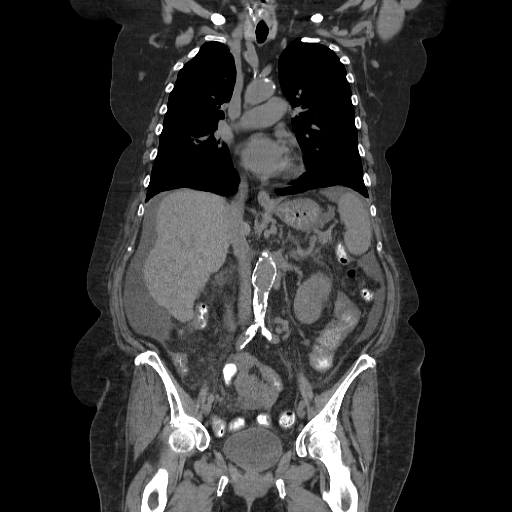
[im 99/178  soft-tissue]
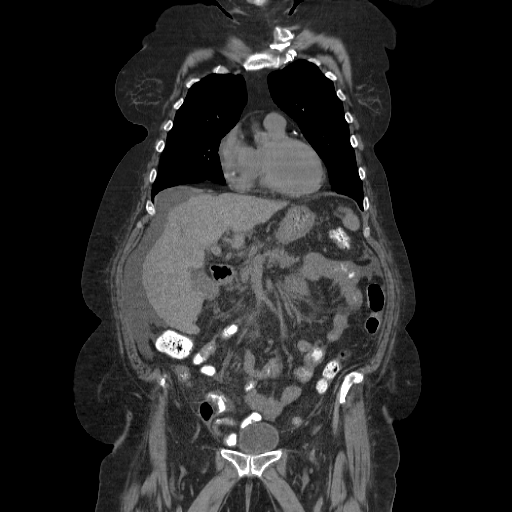

[13 of 42 positions shown; findings below may reference images not displayed]

FINDINGS: Evaluation of this exam is limited in the absence of intravenous
contrast.

CT CHEST FINDINGS

Cardiovascular: There is no cardiomegaly or pericardial effusion.
There is 3 vessel coronary vascular calcification. Moderate
atherosclerotic calcification of the thoracic aorta. Mild dilatation
of the main pulmonary trunk may represent a degree of pulmonary
hypertension. Clinical correlation is recommended.

Mediastinum/Nodes: There is no hilar or mediastinal adenopathy. The
esophagus and the thyroid gland are grossly unremarkable. No
mediastinal fluid collection.

Lungs/Pleura: Mild emphysema. There is no focal consolidation,
pleural effusion, or pneumothorax. The central airways are patent.

Musculoskeletal: Degenerative changes of the spine. No acute osseous
pathology.

CT ABDOMEN PELVIS FINDINGS

There is no intra-abdominal free air. There is a small ascites.

Hepatobiliary: Cirrhosis. Ill-defined low attenuating area in the
left lobe of the liver measuring approximately 4 x 5.5 cm is not
well characterized but may represent an area of fatty infiltration.
A mass is not excluded. Further initial evaluation with ultrasound
is recommended. No intrahepatic biliary ductal dilatation. Probable
small amount of sludge within the gallbladder.

Pancreas: Unremarkable. No pancreatic ductal dilatation or
surrounding inflammatory changes.

Spleen: Normal in size without focal abnormality.

Adrenals/Urinary Tract: The adrenal glands are unremarkable. There
is no hydronephrosis or nephrolithiasis on either side. There is
mild right renal parenchyma atrophy. The visualized ureters and
urinary bladder appear unremarkable.

Stomach/Bowel: Mildly thickened gastric antrum may be related to
underdistention or ascites. Focal inflammation or pelvic ulcer is
not excluded. Clinical correlation is recommended. There is no bowel
obstruction. The appendix is normal.

Vascular/Lymphatic: There is advanced aortoiliac atherosclerotic
disease. There is a 3 cm infrarenal aortic aneurysm. The IVC is
unremarkable. No portal venous gas. There is no adenopathy.

Reproductive: Hysterectomy.

Other: None

Musculoskeletal: Degenerative changes of the spine. No acute osseous
pathology.
IMPRESSION: 1. No acute intrathoracic pathology.
2. Cirrhosis with small ascites.
3. Ill-defined low attenuating area in the left lobe of the liver
may represent an area of fatty infiltration. A mass is not excluded.
Further initial evaluation with ultrasound is recommended.
4. Mildly thickened gastric antrum may be related to underdistention
or ascites. Focal inflammation or pelvic ulcer is not excluded.
Clinical correlation is recommended. No bowel obstruction. Normal
appendix.
5. Aortic Atherosclerosis (LY9UE-AGE.E) and Emphysema (LY9UE-5DQ.G).
